# Patient Record
Sex: Female | Born: 1940 | Race: Black or African American | Hispanic: No | Smoking: Former smoker
Health system: Southern US, Community
[De-identification: ages and names within clinical notes are randomized; demographics above are authoritative.]

## PROBLEM LIST (undated history)

## (undated) DIAGNOSIS — F319 Bipolar disorder, unspecified: Secondary | ICD-10-CM

## (undated) DIAGNOSIS — R112 Nausea with vomiting, unspecified: Secondary | ICD-10-CM

## (undated) DIAGNOSIS — F32A Depression, unspecified: Secondary | ICD-10-CM

## (undated) DIAGNOSIS — M6281 Muscle weakness (generalized): Secondary | ICD-10-CM

## (undated) DIAGNOSIS — G629 Polyneuropathy, unspecified: Secondary | ICD-10-CM

## (undated) DIAGNOSIS — F209 Schizophrenia, unspecified: Secondary | ICD-10-CM

## (undated) DIAGNOSIS — M069 Rheumatoid arthritis, unspecified: Secondary | ICD-10-CM

## (undated) DIAGNOSIS — G8220 Paraplegia, unspecified: Secondary | ICD-10-CM

## (undated) DIAGNOSIS — K219 Gastro-esophageal reflux disease without esophagitis: Secondary | ICD-10-CM

## (undated) DIAGNOSIS — Z933 Colostomy status: Secondary | ICD-10-CM

## (undated) DIAGNOSIS — M869 Osteomyelitis, unspecified: Secondary | ICD-10-CM

## (undated) DIAGNOSIS — Z8719 Personal history of other diseases of the digestive system: Secondary | ICD-10-CM

## (undated) DIAGNOSIS — D649 Anemia, unspecified: Secondary | ICD-10-CM

## (undated) DIAGNOSIS — J449 Chronic obstructive pulmonary disease, unspecified: Secondary | ICD-10-CM

## (undated) DIAGNOSIS — F329 Major depressive disorder, single episode, unspecified: Secondary | ICD-10-CM

## (undated) DIAGNOSIS — Z9889 Other specified postprocedural states: Secondary | ICD-10-CM

## (undated) HISTORY — PX: GANGLION CYST EXCISION: SHX1691

## (undated) HISTORY — PX: BREAST SURGERY: SHX581

## (undated) HISTORY — PX: HERNIA REPAIR: SHX51

## (undated) HISTORY — PX: EYE SURGERY: SHX253

## (undated) HISTORY — PX: KNEE SURGERY: SHX244

## (undated) HISTORY — PX: WOUND DEBRIDEMENT: SHX247

## (undated) HISTORY — PX: COLON SURGERY: SHX602

## (undated) HISTORY — PX: ABDOMINAL HYSTERECTOMY: SHX81

## (undated) HISTORY — PX: JOINT REPLACEMENT: SHX530

---

## 2005-03-04 ENCOUNTER — Emergency Department: Payer: Self-pay | Admitting: Internal Medicine

## 2005-07-25 ENCOUNTER — Inpatient Hospital Stay: Payer: Self-pay | Admitting: Endocrinology

## 2005-07-25 ENCOUNTER — Other Ambulatory Visit: Payer: Self-pay

## 2006-01-06 ENCOUNTER — Ambulatory Visit (HOSPITAL_COMMUNITY): Admission: RE | Admit: 2006-01-06 | Discharge: 2006-01-06 | Payer: Self-pay | Admitting: Internal Medicine

## 2006-01-10 ENCOUNTER — Ambulatory Visit (HOSPITAL_COMMUNITY): Admission: RE | Admit: 2006-01-10 | Discharge: 2006-01-10 | Payer: Self-pay | Admitting: Internal Medicine

## 2006-01-12 ENCOUNTER — Ambulatory Visit (HOSPITAL_COMMUNITY): Admission: RE | Admit: 2006-01-12 | Discharge: 2006-01-12 | Payer: Self-pay | Admitting: Internal Medicine

## 2008-05-03 ENCOUNTER — Emergency Department (HOSPITAL_COMMUNITY): Admission: EM | Admit: 2008-05-03 | Discharge: 2008-05-03 | Payer: Self-pay | Admitting: Emergency Medicine

## 2008-06-02 ENCOUNTER — Ambulatory Visit (HOSPITAL_COMMUNITY): Admission: RE | Admit: 2008-06-02 | Discharge: 2008-06-02 | Payer: Self-pay | Admitting: Internal Medicine

## 2008-06-16 ENCOUNTER — Inpatient Hospital Stay (HOSPITAL_COMMUNITY): Admission: EM | Admit: 2008-06-16 | Discharge: 2008-06-23 | Payer: Self-pay | Admitting: Emergency Medicine

## 2008-06-19 ENCOUNTER — Ambulatory Visit: Payer: Self-pay | Admitting: Infectious Diseases

## 2009-12-19 ENCOUNTER — Emergency Department (HOSPITAL_COMMUNITY): Admission: EM | Admit: 2009-12-19 | Discharge: 2009-12-19 | Payer: Self-pay | Admitting: Emergency Medicine

## 2010-04-30 ENCOUNTER — Emergency Department (HOSPITAL_COMMUNITY): Admission: EM | Admit: 2010-04-30 | Discharge: 2010-04-30 | Payer: Self-pay | Admitting: Emergency Medicine

## 2010-05-04 ENCOUNTER — Inpatient Hospital Stay (HOSPITAL_COMMUNITY)
Admission: EM | Admit: 2010-05-04 | Discharge: 2010-05-12 | Payer: Self-pay | Source: Home / Self Care | Admitting: Emergency Medicine

## 2010-05-11 ENCOUNTER — Encounter (INDEPENDENT_AMBULATORY_CARE_PROVIDER_SITE_OTHER): Payer: Self-pay | Admitting: Internal Medicine

## 2010-09-20 ENCOUNTER — Emergency Department (HOSPITAL_COMMUNITY): Admission: EM | Admit: 2010-09-20 | Discharge: 2010-09-20 | Payer: Self-pay | Admitting: Emergency Medicine

## 2010-09-28 ENCOUNTER — Emergency Department (HOSPITAL_COMMUNITY): Admission: EM | Admit: 2010-09-28 | Discharge: 2010-09-30 | Payer: Self-pay | Admitting: Emergency Medicine

## 2010-10-20 ENCOUNTER — Inpatient Hospital Stay (HOSPITAL_COMMUNITY)
Admission: EM | Admit: 2010-10-20 | Discharge: 2010-10-28 | Payer: Self-pay | Source: Home / Self Care | Admitting: Emergency Medicine

## 2010-10-20 DIAGNOSIS — F259 Schizoaffective disorder, unspecified: Secondary | ICD-10-CM

## 2010-12-19 ENCOUNTER — Encounter: Payer: Self-pay | Admitting: Endocrinology

## 2011-02-08 LAB — URINE MICROSCOPIC-ADD ON

## 2011-02-08 LAB — CBC
HCT: 30.9 % — ABNORMAL LOW (ref 36.0–46.0)
HCT: 31.8 % — ABNORMAL LOW (ref 36.0–46.0)
HCT: 38 % (ref 36.0–46.0)
Hemoglobin: 10.3 g/dL — ABNORMAL LOW (ref 12.0–15.0)
Hemoglobin: 10.7 g/dL — ABNORMAL LOW (ref 12.0–15.0)
Hemoglobin: 12.1 g/dL (ref 12.0–15.0)
MCH: 30 pg (ref 26.0–34.0)
MCH: 30.2 pg (ref 26.0–34.0)
MCH: 30.4 pg (ref 26.0–34.0)
MCH: 30.5 pg (ref 26.0–34.0)
MCHC: 33.2 g/dL (ref 30.0–36.0)
MCHC: 33.7 g/dL (ref 30.0–36.0)
MCV: 89.7 fL (ref 78.0–100.0)
MCV: 90.4 fL (ref 78.0–100.0)
MCV: 90.9 fL (ref 78.0–100.0)
Platelets: 373 10*3/uL (ref 150–400)
Platelets: 414 10*3/uL — ABNORMAL HIGH (ref 150–400)
Platelets: 417 10*3/uL — ABNORMAL HIGH (ref 150–400)
RBC: 3.42 MIL/uL — ABNORMAL LOW (ref 3.87–5.11)
RBC: 3.55 MIL/uL — ABNORMAL LOW (ref 3.87–5.11)
RBC: 3.55 MIL/uL — ABNORMAL LOW (ref 3.87–5.11)
RBC: 3.97 MIL/uL (ref 3.87–5.11)
RDW: 13.9 % (ref 11.5–15.5)
RDW: 13.9 % (ref 11.5–15.5)
RDW: 14.5 % (ref 11.5–15.5)
WBC: 11.9 10*3/uL — ABNORMAL HIGH (ref 4.0–10.5)
WBC: 6.7 10*3/uL (ref 4.0–10.5)
WBC: 6.9 10*3/uL (ref 4.0–10.5)
WBC: 7.1 10*3/uL (ref 4.0–10.5)
WBC: 8.1 10*3/uL (ref 4.0–10.5)

## 2011-02-08 LAB — GLUCOSE, CAPILLARY
Glucose-Capillary: 147 mg/dL — ABNORMAL HIGH (ref 70–99)
Glucose-Capillary: 153 mg/dL — ABNORMAL HIGH (ref 70–99)
Glucose-Capillary: 156 mg/dL — ABNORMAL HIGH (ref 70–99)
Glucose-Capillary: 160 mg/dL — ABNORMAL HIGH (ref 70–99)
Glucose-Capillary: 166 mg/dL — ABNORMAL HIGH (ref 70–99)
Glucose-Capillary: 169 mg/dL — ABNORMAL HIGH (ref 70–99)
Glucose-Capillary: 179 mg/dL — ABNORMAL HIGH (ref 70–99)
Glucose-Capillary: 219 mg/dL — ABNORMAL HIGH (ref 70–99)
Glucose-Capillary: 223 mg/dL — ABNORMAL HIGH (ref 70–99)
Glucose-Capillary: 224 mg/dL — ABNORMAL HIGH (ref 70–99)
Glucose-Capillary: 226 mg/dL — ABNORMAL HIGH (ref 70–99)
Glucose-Capillary: 230 mg/dL — ABNORMAL HIGH (ref 70–99)
Glucose-Capillary: 235 mg/dL — ABNORMAL HIGH (ref 70–99)
Glucose-Capillary: 239 mg/dL — ABNORMAL HIGH (ref 70–99)
Glucose-Capillary: 251 mg/dL — ABNORMAL HIGH (ref 70–99)
Glucose-Capillary: 252 mg/dL — ABNORMAL HIGH (ref 70–99)
Glucose-Capillary: 275 mg/dL — ABNORMAL HIGH (ref 70–99)
Glucose-Capillary: 279 mg/dL — ABNORMAL HIGH (ref 70–99)
Glucose-Capillary: 283 mg/dL — ABNORMAL HIGH (ref 70–99)
Glucose-Capillary: 320 mg/dL — ABNORMAL HIGH (ref 70–99)
Glucose-Capillary: 421 mg/dL — ABNORMAL HIGH (ref 70–99)
Glucose-Capillary: 94 mg/dL (ref 70–99)

## 2011-02-08 LAB — BASIC METABOLIC PANEL
BUN: 6 mg/dL (ref 6–23)
CO2: 27 mEq/L (ref 19–32)
CO2: 28 mEq/L (ref 19–32)
Calcium: 8.9 mg/dL (ref 8.4–10.5)
Calcium: 8.9 mg/dL (ref 8.4–10.5)
Calcium: 9 mg/dL (ref 8.4–10.5)
Chloride: 99 mEq/L (ref 96–112)
Creatinine, Ser: 0.62 mg/dL (ref 0.4–1.2)
Creatinine, Ser: 0.63 mg/dL (ref 0.4–1.2)
GFR calc Af Amer: >60 mL/min (ref >60)
GFR calc Af Amer: >60 mL/min (ref >60)
GFR calc Af Amer: >60 mL/min (ref >60)
GFR calc non Af Amer: >60 mL/min (ref >60)
GFR calc non Af Amer: >60 mL/min (ref >60)
GFR calc non Af Amer: >60 mL/min (ref >60)
Glucose, Bld: 335 mg/dL — ABNORMAL HIGH (ref 70–99)
Potassium: 3.7 mEq/L (ref 3.5–5.1)
Sodium: 136 mEq/L (ref 135–145)
Sodium: 140 mEq/L (ref 135–145)
Sodium: 143 mEq/L (ref 135–145)

## 2011-02-08 LAB — URINE CULTURE
Colony Count: 990
Colony Count: >=100000
Culture  Setup Time: 201111020127

## 2011-02-08 LAB — RAPID URINE DRUG SCREEN, HOSP PERFORMED
Benzodiazepines: NOT DETECTED
Cocaine: NOT DETECTED
Opiates: NOT DETECTED
Tetrahydrocannabinol: NOT DETECTED
Tetrahydrocannabinol: NOT DETECTED

## 2011-02-08 LAB — ETHANOL: Alcohol, Ethyl (B): <5 mg/dL (ref 0–10)

## 2011-02-08 LAB — HEPATIC FUNCTION PANEL
ALT: 14 U/L (ref 0–35)
AST: 18 U/L (ref 0–37)
Albumin: 2.9 g/dL — ABNORMAL LOW (ref 3.5–5.2)
Alkaline Phosphatase: 58 U/L (ref 39–117)
Bilirubin, Direct: 0.1 mg/dL (ref 0.0–0.3)
Indirect Bilirubin: 0.2 mg/dL — ABNORMAL LOW (ref 0.3–0.9)
Total Bilirubin: 0.3 mg/dL (ref 0.3–1.2)
Total Protein: 7.7 g/dL (ref 6.0–8.3)

## 2011-02-08 LAB — URINALYSIS, MICROSCOPIC ONLY
Glucose, UA: NEGATIVE mg/dL
Protein, ur: NEGATIVE mg/dL
pH: 7 (ref 5.0–8.0)

## 2011-02-08 LAB — DIFFERENTIAL
Basophils Absolute: 0 10*3/uL (ref 0.0–0.1)
Basophils Absolute: 0 10*3/uL (ref 0.0–0.1)
Basophils Relative: 0 % (ref 0–1)
Eosinophils Absolute: 0.1 10*3/uL (ref 0.0–0.7)
Eosinophils Relative: 1 % (ref 0–5)
Lymphocytes Relative: 16 % (ref 12–46)
Monocytes Relative: 4 % (ref 3–12)
Neutro Abs: 9.4 10*3/uL — ABNORMAL HIGH (ref 1.7–7.7)
Neutrophils Relative %: 78 % — ABNORMAL HIGH (ref 43–77)
Neutrophils Relative %: 79 % — ABNORMAL HIGH (ref 43–77)

## 2011-02-08 LAB — URINALYSIS, ROUTINE W REFLEX MICROSCOPIC
Bilirubin Urine: NEGATIVE
Ketones, ur: NEGATIVE mg/dL
Nitrite: NEGATIVE
Nitrite: POSITIVE — AB
Protein, ur: NEGATIVE mg/dL
Specific Gravity, Urine: 1.01 (ref 1.005–1.030)
Urobilinogen, UA: 0.2 mg/dL (ref 0.0–1.0)
Urobilinogen, UA: 0.2 mg/dL (ref 0.0–1.0)

## 2011-02-08 LAB — COMPREHENSIVE METABOLIC PANEL
Alkaline Phosphatase: 68 U/L (ref 39–117)
BUN: 3 mg/dL — ABNORMAL LOW (ref 6–23)
Creatinine, Ser: 0.53 mg/dL (ref 0.4–1.2)
Glucose, Bld: 113 mg/dL — ABNORMAL HIGH (ref 70–99)
Potassium: 3.9 mEq/L (ref 3.5–5.1)
Total Bilirubin: 0.5 mg/dL (ref 0.3–1.2)
Total Protein: 8 g/dL (ref 6.0–8.3)

## 2011-02-08 LAB — MRSA PCR SCREENING: MRSA by PCR: POSITIVE — AB

## 2011-02-09 LAB — POCT I-STAT, CHEM 8
BUN: 5 mg/dL — ABNORMAL LOW (ref 6–23)
Calcium, Ion: 1.06 mmol/L — ABNORMAL LOW (ref 1.12–1.32)
Chloride: 105 mEq/L (ref 96–112)
Creatinine, Ser: 0.5 mg/dL (ref 0.4–1.2)
Glucose, Bld: 242 mg/dL — ABNORMAL HIGH (ref 70–99)
HCT: 42 % (ref 36.0–46.0)
Hemoglobin: 14.3 g/dL (ref 12.0–15.0)
Potassium: 3.8 mEq/L (ref 3.5–5.1)
Sodium: 139 mEq/L (ref 135–145)
TCO2: 28 mmol/L (ref 0–100)

## 2011-02-13 LAB — BASIC METABOLIC PANEL
CO2: 25 mEq/L (ref 19–32)
Calcium: 7.8 mg/dL — ABNORMAL LOW (ref 8.4–10.5)
Creatinine, Ser: 0.39 mg/dL — ABNORMAL LOW (ref 0.4–1.2)
Glucose, Bld: 123 mg/dL — ABNORMAL HIGH (ref 70–99)

## 2011-02-13 LAB — CBC
Hemoglobin: 7.5 g/dL — ABNORMAL LOW (ref 12.0–15.0)
MCHC: 32.8 g/dL (ref 30.0–36.0)
MCV: 87.5 fL (ref 78.0–100.0)
RDW: 17.1 % — ABNORMAL HIGH (ref 11.5–15.5)

## 2011-02-13 LAB — GLUCOSE, CAPILLARY
Glucose-Capillary: 109 mg/dL — ABNORMAL HIGH (ref 70–99)
Glucose-Capillary: 111 mg/dL — ABNORMAL HIGH (ref 70–99)

## 2011-02-14 LAB — BASIC METABOLIC PANEL
BUN: 2 mg/dL — ABNORMAL LOW (ref 6–23)
BUN: 14 mg/dL (ref 6–23)
BUN: 5 mg/dL — ABNORMAL LOW (ref 6–23)
BUN: 6 mg/dL (ref 6–23)
BUN: 7 mg/dL (ref 6–23)
BUN: <1 mg/dL — ABNORMAL LOW (ref 6–23)
CO2: 26 mEq/L (ref 19–32)
CO2: 27 mEq/L (ref 19–32)
CO2: 30 mEq/L (ref 19–32)
Calcium: 7.8 mg/dL — ABNORMAL LOW (ref 8.4–10.5)
Calcium: 8.4 mg/dL (ref 8.4–10.5)
Chloride: 103 mEq/L (ref 96–112)
Chloride: 104 mEq/L (ref 96–112)
Chloride: 106 mEq/L (ref 96–112)
Chloride: 109 mEq/L (ref 96–112)
Chloride: 98 mEq/L (ref 96–112)
Creatinine, Ser: 0.4 mg/dL (ref 0.4–1.2)
Creatinine, Ser: 0.48 mg/dL (ref 0.4–1.2)
Creatinine, Ser: 0.5 mg/dL (ref 0.4–1.2)
Creatinine, Ser: 0.55 mg/dL (ref 0.4–1.2)
GFR calc Af Amer: >60 mL/min (ref >60)
GFR calc Af Amer: >60 mL/min (ref >60)
GFR calc non Af Amer: >60 mL/min (ref >60)
GFR calc non Af Amer: >60 mL/min (ref >60)
GFR calc non Af Amer: >60 mL/min (ref >60)
Glucose, Bld: 207 mg/dL — ABNORMAL HIGH (ref 70–99)
Glucose, Bld: 220 mg/dL — ABNORMAL HIGH (ref 70–99)
Glucose, Bld: 363 mg/dL — ABNORMAL HIGH (ref 70–99)
Potassium: 3.9 mEq/L (ref 3.5–5.1)
Potassium: 3.9 mEq/L (ref 3.5–5.1)
Potassium: 4.4 mEq/L (ref 3.5–5.1)
Sodium: 141 mEq/L (ref 135–145)
Sodium: 131 mEq/L — ABNORMAL LOW (ref 135–145)
Sodium: 138 mEq/L (ref 135–145)
Sodium: 141 mEq/L (ref 135–145)

## 2011-02-14 LAB — URINALYSIS, ROUTINE W REFLEX MICROSCOPIC
Bilirubin Urine: NEGATIVE
Bilirubin Urine: NEGATIVE
Glucose, UA: 500 mg/dL — AB
Ketones, ur: 15 mg/dL — AB
Ketones, ur: NEGATIVE mg/dL
Nitrite: NEGATIVE
Protein, ur: NEGATIVE mg/dL
pH: 7 (ref 5.0–8.0)
pH: 5.5 (ref 5.0–8.0)

## 2011-02-14 LAB — GLUCOSE, CAPILLARY
Glucose-Capillary: 128 mg/dL — ABNORMAL HIGH (ref 70–99)
Glucose-Capillary: 141 mg/dL — ABNORMAL HIGH (ref 70–99)
Glucose-Capillary: 144 mg/dL — ABNORMAL HIGH (ref 70–99)
Glucose-Capillary: 162 mg/dL — ABNORMAL HIGH (ref 70–99)
Glucose-Capillary: 183 mg/dL — ABNORMAL HIGH (ref 70–99)
Glucose-Capillary: 200 mg/dL — ABNORMAL HIGH (ref 70–99)
Glucose-Capillary: 236 mg/dL — ABNORMAL HIGH (ref 70–99)
Glucose-Capillary: 260 mg/dL — ABNORMAL HIGH (ref 70–99)
Glucose-Capillary: 287 mg/dL — ABNORMAL HIGH (ref 70–99)
Glucose-Capillary: 293 mg/dL — ABNORMAL HIGH (ref 70–99)
Glucose-Capillary: 312 mg/dL — ABNORMAL HIGH (ref 70–99)
Glucose-Capillary: 317 mg/dL — ABNORMAL HIGH (ref 70–99)
Glucose-Capillary: 324 mg/dL — ABNORMAL HIGH (ref 70–99)
Glucose-Capillary: 334 mg/dL — ABNORMAL HIGH (ref 70–99)
Glucose-Capillary: 369 mg/dL — ABNORMAL HIGH (ref 70–99)
Glucose-Capillary: 75 mg/dL (ref 70–99)

## 2011-02-14 LAB — CBC
HCT: 38.1 % (ref 36.0–46.0)
HCT: 23.9 % — ABNORMAL LOW (ref 36.0–46.0)
HCT: 25.7 % — ABNORMAL LOW (ref 36.0–46.0)
HCT: 28 % — ABNORMAL LOW (ref 36.0–46.0)
Hemoglobin: 13 g/dL (ref 12.0–15.0)
Hemoglobin: 8.5 g/dL — ABNORMAL LOW (ref 12.0–15.0)
Hemoglobin: 8.7 g/dL — ABNORMAL LOW (ref 12.0–15.0)
Hemoglobin: 8.9 g/dL — ABNORMAL LOW (ref 12.0–15.0)
Hemoglobin: 9.2 g/dL — ABNORMAL LOW (ref 12.0–15.0)
MCHC: 32.7 g/dL (ref 30.0–36.0)
MCHC: 32.8 g/dL (ref 30.0–36.0)
MCHC: 32.9 g/dL (ref 30.0–36.0)
MCHC: 33 g/dL (ref 30.0–36.0)
MCHC: 33.3 g/dL (ref 30.0–36.0)
MCV: 91.6 fL (ref 78.0–100.0)
MCV: 86.2 fL (ref 78.0–100.0)
MCV: 86.7 fL (ref 78.0–100.0)
MCV: 86.9 fL (ref 78.0–100.0)
MCV: 87.1 fL (ref 78.0–100.0)
MCV: 88.1 fL (ref 78.0–100.0)
MCV: 88.9 fL (ref 78.0–100.0)
Platelets: 222 10*3/uL (ref 150–400)
Platelets: 350 10*3/uL (ref 150–400)
Platelets: 367 10*3/uL (ref 150–400)
Platelets: 388 10*3/uL (ref 150–400)
Platelets: 405 10*3/uL — ABNORMAL HIGH (ref 150–400)
Platelets: 448 10*3/uL — ABNORMAL HIGH (ref 150–400)
RBC: 4.16 MIL/uL (ref 3.87–5.11)
RBC: 2.69 MIL/uL — ABNORMAL LOW (ref 3.87–5.11)
RBC: 3.03 MIL/uL — ABNORMAL LOW (ref 3.87–5.11)
RDW: 15.4 % (ref 11.5–15.5)
RDW: 15.6 % — ABNORMAL HIGH (ref 11.5–15.5)
RDW: 16.4 % — ABNORMAL HIGH (ref 11.5–15.5)
RDW: 17 % — ABNORMAL HIGH (ref 11.5–15.5)
RDW: 17.3 % — ABNORMAL HIGH (ref 11.5–15.5)
WBC: 4.2 10*3/uL (ref 4.0–10.5)
WBC: 6.1 10*3/uL (ref 4.0–10.5)
WBC: 6.4 10*3/uL (ref 4.0–10.5)
WBC: 6.4 10*3/uL (ref 4.0–10.5)
WBC: 6.9 10*3/uL (ref 4.0–10.5)

## 2011-02-14 LAB — CLOSTRIDIUM DIFFICILE EIA: C difficile Toxins A+B, EIA: NEGATIVE

## 2011-02-14 LAB — LIPID PANEL
Cholesterol: 130 mg/dL (ref 0–200)
HDL: 54 mg/dL (ref >39)
Total CHOL/HDL Ratio: 2.4 RATIO
Triglycerides: 119 mg/dL (ref <150)

## 2011-02-14 LAB — COMPREHENSIVE METABOLIC PANEL
ALT: 12 U/L (ref 0–35)
Calcium: 8.7 mg/dL (ref 8.4–10.5)
Glucose, Bld: 311 mg/dL — ABNORMAL HIGH (ref 70–99)
Sodium: 139 mEq/L (ref 135–145)
Total Protein: 7.4 g/dL (ref 6.0–8.3)

## 2011-02-14 LAB — CK TOTAL AND CKMB (NOT AT ARMC)
CK, MB: 0.9 ng/mL (ref 0.3–4.0)
Relative Index: 0.7 (ref 0.0–2.5)
Total CK: 128 U/L (ref 7–177)

## 2011-02-14 LAB — TROPONIN I
Troponin I: 0.01 ng/mL (ref 0.00–0.06)
Troponin I: <0.01 ng/mL (ref 0.00–0.06)

## 2011-02-14 LAB — URINE MICROSCOPIC-ADD ON

## 2011-02-14 LAB — VITAMIN B12: Vitamin B-12: 433 pg/mL (ref 211–911)

## 2011-02-14 LAB — POCT I-STAT 4, (NA,K, GLUC, HGB,HCT)
Glucose, Bld: 187 mg/dL — ABNORMAL HIGH (ref 70–99)
Hemoglobin: 10.2 g/dL — ABNORMAL LOW (ref 12.0–15.0)
Hemoglobin: 7.8 g/dL — ABNORMAL LOW (ref 12.0–15.0)
Potassium: 3.4 mEq/L — ABNORMAL LOW (ref 3.5–5.1)
Sodium: 145 mEq/L (ref 135–145)

## 2011-02-14 LAB — DIFFERENTIAL
Eosinophils Absolute: 0.2 10*3/uL (ref 0.0–0.7)
Eosinophils Absolute: 0.3 10*3/uL (ref 0.0–0.7)
Eosinophils Relative: 5 % (ref 0–5)
Eosinophils Relative: 4 % (ref 0–5)
Lymphocytes Relative: 34 % (ref 12–46)
Lymphocytes Relative: 23 % (ref 12–46)
Lymphs Abs: 1.5 10*3/uL (ref 0.7–4.0)
Lymphs Abs: 1.4 10*3/uL (ref 0.7–4.0)
Monocytes Absolute: 0.4 10*3/uL (ref 0.1–1.0)
Monocytes Absolute: 0.7 10*3/uL (ref 0.1–1.0)
Monocytes Relative: 9 % (ref 3–12)
Monocytes Relative: 11 % (ref 3–12)

## 2011-02-14 LAB — HEMOCCULT GUIAC POC 1CARD (OFFICE): Fecal Occult Bld: POSITIVE

## 2011-02-14 LAB — CULTURE, BLOOD (ROUTINE X 2)

## 2011-02-14 LAB — TYPE AND SCREEN: ABO/RH(D): O POS

## 2011-02-14 LAB — TSH: TSH: 2.905 u[IU]/mL (ref 0.350–4.500)

## 2011-02-14 LAB — HEMOGLOBIN A1C: Mean Plasma Glucose: 223 mg/dL — ABNORMAL HIGH (ref <117)

## 2011-02-14 LAB — RETICULOCYTES
RBC.: 3.4 MIL/uL — ABNORMAL LOW (ref 3.87–5.11)
Retic Count, Absolute: 71.4 10*3/uL (ref 19.0–186.0)

## 2011-02-14 LAB — URINE CULTURE: Colony Count: >=100000

## 2011-02-14 LAB — PREPARE RBC (CROSSMATCH)

## 2011-02-14 LAB — IRON AND TIBC: Iron: 36 ug/dL — ABNORMAL LOW (ref 42–135)

## 2011-04-12 NOTE — H&P (Signed)
NAMEMarland Kitchen  ARADIA, ESTEY NO.:  [id number]   MEDICAL RECORD NO.:  [id number]          PATIENT TYPE:  INP   LOCATION:  0104                         FACILITY:  [facility]   PHYSICIAN:  Vania Rea, M.D. DATE OF BIRTH:  09/18/1941   DATE OF ADMISSION:  06/16/2008  DATE OF DISCHARGE:                              HISTORY & PHYSICAL   PRIMARY CARE PHYSICIAN:  [facility], Dr. Leanord Hawking and also Dr.  Kerry Dory.   CHIEF COMPLAINT:  Fever and shaking.   HISTORY OF PRESENT ILLNESS:  This is a 70 year old resident of a skilled  nursing facility, who has a history of schizo-affective disorder, remote  history of necrotizing fasciitis of the right hip, leading to a chronic  right buttock stage IV ulcer extended down to the bone, who is also  diabetic and who had a CT scan of the pelvis on July 6, which showed an  ulcer extending to the right greater trochanter and left ischial  tuberosity with chronic osteomyelitis of the right greater trochanter  and left shin.  The patient also had wound cultures which shows recently  came back on June 14, 2008, 2 days ago showing MRSA.  The patient was in  the nursing home, being treated with Rocephin and Augmentin, but today  had what was described as seizures and fever, and was brought in to the  emergency room for evaluation.  The patient also carries a diagnosis of  dementia and is unable to contribute to her history.  So the history is  taken from nursing home records, although there was a discussion with  her daughter, Brent Bulla, who has not seen her for some time.  There  is no history of cough or shortness of breath.  The patient is  essentially bed-bound.  The patient also has a chronic indwelling Foley.   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  Diabetes type 2,  COPD, osteoarthritis, schizo-affective disorder, history of endometrial  cancer, incisional hernia with chronic diverting colostomy dementia,  chronic anemia, maintained on Procrit.   MEDICATIONS:  Including:  1. Lantus 15 units daily.  2. Glipizide 10 mg daily.  3. Glucophage 1 tablet twice daily.  4. Depakote 750 mg twice daily.  5. Aricept 10 mg at bedtime.  6. Quetiapine 200 mg morning and afternoon and 300 mg at bedtime.  7. Zoloft 150 mg daily.  8. Baclofen 20 mg twice daily.  9. Iron sulfate 325 mg daily.  10.Centrum tablets 1 daily.  11.K-Dur 20 mEq daily.  12.Percocet 5/325, 1 tablet 3 times daily.  13.Vicodin 5/500, 2 tablets every 4 hours p.r.n.  14.Tylenol 650 mg every 4 hours p.r.n.  15.Sliding scale insulin.  16.Klonopin 0.5 mg twice daily.   ALLERGIES:  HALDOL DECANOATE, ACE INHIBITORS AND FOR THORAZINE.   SOCIAL HISTORY:  The patient now lives, it appears, at [facility].  Previously was at [facility].  Used to be wheelchair dependent  and able to transfer herself with assistance.  Does not appear to be  doing that now.  Admits to smoking one pack per day of cigarettes.  Denies alcohol or illicit drug.   FAMILY HISTORY:  Significant for diabetes mellitus.   REVIEW OF SYSTEMS:  Because of the patient's mental status, unable to  ascertain.  However, review of the nursing home chart indicates the  patient is generally alert, confused at times.  Has contractures,  limited assist.  Fair appetite, is able to feed herself.  Has a chronic  indwelling Foley.  Is incontinent of urine.  Her hearing is good and she  has dentures.   PHYSICAL EXAMINATION:  Pleasant, elderly African American lady lying in  the stretcher.  She is oriented only to herself, but not time or place.  She is alert, does not answer questions appropriately, but cooperates  with the physical exam.  VITAL SIGNS:  Initially her temperature is 102.8 orally with a pulse of  141.  Respirations 20, and blood pressure 115/53 with IV fluids this  pulse has come down to  125, gut her blood pressure has fallen to 82/49.  Her pupils are round and equal.  Mucous membranes are pale.  She has no  cervical or thyroid lymphadenopathy.  No jugular venous distention.  CHEST:  Clear to auscultation bilaterally.  CARDIOVASCULAR:  She is tachycardic.  ABDOMEN:  She has a ventral hernia.  She has a clean-looking colostomy.  The bag is empty.  She does not appear to have any abdominal tenderness.  EXTREMITIES:  She is contracted.  There is no edema.  She has a stage II  wound of the left buttock and a stage IV wound of the right buttock,  which is packed.  CENTRAL NERVOUS SYSTEM:  Cranial nerves 2-12 appear to be grossly intact  and she has good color in both upper extremities, grade 4-5 within the  limits that she is able to cooperate with the exam.  She does have some  wasting of the small muscles of the hand, unable to fully assess  neurologic status of her lower extremities.   LABORATORY DATA:  As noted, cultures done few days ago show MRSA in the  right buttock wound.  Her white count today is 5.0, hemoglobin 7.7,  hematocrit 24.4, MCV 87, platelets 426 with 93% neutrophils.  Her sodium  is 140, potassium 3.3, chloride 103, BUN 6, creatinine 0.6, glucose 151,  ionized calcium is 1.04.  Chest x-ray shows no evidence of pneumonia.  Urinalysis shows a specific gravity of 1.029 and trace leukocyte  esterase and 15 mg/dL of ketones occult blood is negative.   ASSESSMENT:  1. Sepsis.  2. Osteomyelitis of the right hip, probably due to methicillin-      resistant Staphylococcus aureus.  3. Chronic anemia.  4. Hypokalemia.  5. Dehydration.  6. Hypocalcemia.  7. Diabetes type 2, fair control.  8. Multiple medical problems.   PLAN:  Will admit this lady for IV fluid hydration and blood cultures  and urine cultures have been done well.  Will start her on vancomycin on  Ertapenem.  Will replace potassium and calcium.  Will continue most of  her chronic medications,  but will treat her diabetes with insulin and  remove the Glucophage and glipizide.  Will transfuse 2 units of packed  red cells, since this will assist healing.  The patient may require  consults to assist with osteomyelitis and may require a PICC line at a  later date.  Other plans as per orders.   ADDENDUM:  The patient's code status has been discussed with her  daughter, Brent Bulla, and she is unable to make a decision at this  time.  The patient will remain full cold until siblings have discussed  code status.      Vania Rea, M.D.  Electronically Signed     LC/MEDQ  D:  06/16/2008  T:  06/16/2008  Job:  [id number]   cc:   Maxwell Caul, M.D.

## 2011-04-12 NOTE — Discharge Summary (Signed)
Shannon Schmidt, Shannon Schmidt              ACCOUNT NO.:  [id number]   MEDICAL RECORD NO.:  [id number]          PATIENT TYPE:  INP   LOCATION:  1307                         FACILITY:  [facility]   PHYSICIAN:  Hind I Eda Paschal, MD      DATE OF BIRTH:  08/14/1941   DATE OF ADMISSION:  06/16/2008  DATE OF DISCHARGE:  06/23/2008                               DISCHARGE SUMMARY   PRIMARY CARE PHYSICIAN:  [facility], Maxwell Caul, M.D.   DISCHARGE DIAGNOSES:  1. Chronic osteomyelitis of the right greater trochanter and left      ischial tuberosity.  2. Methicillin-resistant Staphylococcus aureus of the decubitus ulcer.  3. Large bilateral decubitus ulcers.  4. Proteus mirabilis bacteremia sensitive to Unasyn and Zosyn.  5. Diabetes mellitus type 2.  6. Schizoaffective disorder.  7. Chronic anemia, anemia of chronic disease.  8. History of dementia.  9. History of osteoarthritis.  10.History of endometrial cancer.  11.Chronic diverting colostomy.   DISCHARGE MEDICATIONS:  1. Insulin Lantus 15 units subcu daily.  2. Glipizide 5 mg daily.  3. Glucophage 500 mg twice daily.  4. Levaquin 750 mg twice daily.  5. Klonopin 0.5 mg twice daily.  6. Percocet 5/325 mg three times daily as needed.  7. Potassium chloride 20 mEq daily.  8. Ferrous sulfate 325 mg daily.  9. Aricept 10 mg at bedtime.  10.Seroquel 200 mg twice daily.  11.Centrum one tablet daily.  12.Zoloft 150 mg daily.  13.Ascorbic acid 500 mg twice daily.  14.Procrit 20,000 units monthly.  15.Insulin sliding scale sensitive t.i.d. a.c.  16.Baclofen 20 mg twice daily.  17.Vancomycin 1 gram IV q.12 hours for 5 weeks.  18.invanz i gm daily iv for 5 weeks   PROCEDURE:  1. PICC line insertion.  2. Chest x-ray; no definitive pneumonia.  3. CT of pelvis and extremity; negative for evidence of abscess.  No      markings or change in the appearance of the pelvis.  Large      bilateral decubitus ulcer with chronic osteomyelitis of  the right      greater trochanter and left ischial tuberosity.   CONSULTATIONS:  Infectious disease consulted and done by Tinnie Gens C.  Ninetta Lights, M.D.   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   HOSPITAL COURSE:  Problem 1.  The patient admitted to the hospital for  chronic osteomyelitis of her lower extremities.  The patient is started  on broad spectrum antibiotics, mainly Zosyn and Vancomycin.  Wound care  was consulted and they recommended air mattress for pressure reduction  and AcuSeal to the right hip and left ischium to absorb drainage and  provide antimicrobial benefit.  Keep heels elevated for greater relief.  During hospitalization, the patient had no further fever or white blood  cells.  Her CT scan of the lower extremities and pelvis which did not  show evidence of an abscess other  than chronic osteomyelitis.  Her wound  culture done at the nursing home which grew MRSA and accordingly the  patient was continued on Vancomycin.  Wound culture here was negative.  Infectious disease was consulted where they recommended to continue  antibiotics for 6 weeks.   Problem 2.  Proteus mirabilis in blood.  Two cultures grew Proteus  mirabilis which is sensitive to Unasyn and Zosyn.  The infectious  disease recommendation was to continue treatment for 6 weeks.  Today is  past 1 week.  CT scan showed no abscess.  The patient has no more fever  or white blood cells.   Problem 3.  Diabetes mellitus which seemed reasonably controlled during  hospitalization.  She had some episodes of hypoglycemia and for that we  decreased the dose of Glipizide and Metformin and we asked the nursing  home for further adjustment of her diabetic regime at nursing home.   Problem 4.  Anemia of chronic  disease, status post blood transfusion,  the patient's hemoglobin was 7.7 on admission and H&H remained stable at  9.7.  Anemia panel did show evidence of anemia of chronic disease.  Occult fecal blood was negative.  We recommend to continue Procrit with  ferrous sulfate as an outpatient.   Problem 5.  Hypokalemia, status post replacement.   Problem 6.  Schizoaffective disorder.  The patient will continue on her  dose of Klonopin and Seroquel and remained stable.  We felt that the  patient is medically stable to be discharged to a nursing home.  Nursing  home is to check Vancomycin peak and trough and adjust the dose as per  pharmacy.  Also adjustment of her diabetic regime to be addressed at the  nursing home.      Hind Bosie Helper, MD  Electronically Signed     HIE/MEDQ  D:  06/23/2008  T:  06/23/2008  Job:  [id number]

## 2011-08-25 LAB — URINE MICROSCOPIC-ADD ON

## 2011-08-25 LAB — CBC
HCT: 28.8 — ABNORMAL LOW
Hemoglobin: 9.3 — ABNORMAL LOW
MCHC: 32.3
MCV: 88.3
RBC: 3.26 — ABNORMAL LOW
RDW: 16.4 — ABNORMAL HIGH

## 2011-08-25 LAB — URINALYSIS, ROUTINE W REFLEX MICROSCOPIC
Bilirubin Urine: NEGATIVE
Hgb urine dipstick: NEGATIVE
Ketones, ur: 15 — AB
Specific Gravity, Urine: 1.036 — ABNORMAL HIGH
Urobilinogen, UA: 1

## 2011-08-25 LAB — URINE CULTURE: Culture: NO GROWTH

## 2011-08-25 LAB — COMPREHENSIVE METABOLIC PANEL
ALT: 16
AST: 18
Albumin: 1.7 — ABNORMAL LOW
Alkaline Phosphatase: 41
Chloride: 103
GFR calc Af Amer: >60
Potassium: 4.2
Sodium: 139
Total Protein: 7.8

## 2011-08-25 LAB — DIFFERENTIAL
Basophils Relative: 0
Eosinophils Absolute: 0.1
Eosinophils Relative: 1
Monocytes Absolute: 0.9
Monocytes Relative: 8
Neutro Abs: 7.7

## 2011-08-26 LAB — URINALYSIS, ROUTINE W REFLEX MICROSCOPIC
Bilirubin Urine: NEGATIVE
Glucose, UA: NEGATIVE
Hgb urine dipstick: NEGATIVE
Ketones, ur: 15 — AB
Nitrite: NEGATIVE
Protein, ur: 30 — AB
Specific Gravity, Urine: 1.029
Urobilinogen, UA: 0.2
pH: 6

## 2011-08-26 LAB — GLUCOSE, CAPILLARY
Glucose-Capillary: 118 — ABNORMAL HIGH
Glucose-Capillary: 127 — ABNORMAL HIGH
Glucose-Capillary: 140 — ABNORMAL HIGH
Glucose-Capillary: 141 — ABNORMAL HIGH
Glucose-Capillary: 141 — ABNORMAL HIGH
Glucose-Capillary: 156 — ABNORMAL HIGH
Glucose-Capillary: 196 — ABNORMAL HIGH
Glucose-Capillary: 226 — ABNORMAL HIGH
Glucose-Capillary: 72
Glucose-Capillary: 88
Glucose-Capillary: 88

## 2011-08-26 LAB — CROSSMATCH
ABO/RH(D): O POS
Antibody Screen: NEGATIVE

## 2011-08-26 LAB — RETICULOCYTES: Retic Ct Pct: 1.5

## 2011-08-26 LAB — CBC
HCT: 24.4 — ABNORMAL LOW
HCT: 30.4 — ABNORMAL LOW
Hemoglobin: 11.2 — ABNORMAL LOW
Hemoglobin: 7.7 — CL
MCHC: 31.6
MCHC: 31.8
MCV: 87
MCV: 88.6
Platelets: 426 — ABNORMAL HIGH
Platelets: 506 — ABNORMAL HIGH
RBC: 2.8 — ABNORMAL LOW
RBC: 3.43 — ABNORMAL LOW
RBC: 3.97
RDW: 18.8 — ABNORMAL HIGH
RDW: 21.1 — ABNORMAL HIGH
WBC: 5
WBC: 6.5
WBC: 7.6

## 2011-08-26 LAB — IRON AND TIBC
Iron: <10 — ABNORMAL LOW
UIBC: 63

## 2011-08-26 LAB — CULTURE, BLOOD (ROUTINE X 2): Culture: NO GROWTH

## 2011-08-26 LAB — POCT I-STAT, CHEM 8
BUN: 6
Calcium, Ion: 1.04 — ABNORMAL LOW
Chloride: 103
Creatinine, Ser: 0.6
Glucose, Bld: 151 — ABNORMAL HIGH
HCT: 27 — ABNORMAL LOW
Hemoglobin: 9.2 — ABNORMAL LOW
Potassium: 3.3 — ABNORMAL LOW
Sodium: 140
TCO2: 27

## 2011-08-26 LAB — BASIC METABOLIC PANEL
BUN: 1 — ABNORMAL LOW
CO2: 31
Calcium: 7.9 — ABNORMAL LOW
Calcium: 8.1 — ABNORMAL LOW
Chloride: 104
Creatinine, Ser: 0.35 — ABNORMAL LOW
Creatinine, Ser: 0.44
GFR calc Af Amer: >60
GFR calc Af Amer: >60
GFR calc non Af Amer: >60
GFR calc non Af Amer: >60
GFR calc non Af Amer: >60
Glucose, Bld: 92
Potassium: 3.3 — ABNORMAL LOW
Sodium: 141
Sodium: 141

## 2011-08-26 LAB — VANCOMYCIN, TROUGH: Vancomycin Tr: 15.8

## 2011-08-26 LAB — DIFFERENTIAL
Basophils Absolute: 0
Basophils Relative: 0
Eosinophils Absolute: 0
Eosinophils Relative: 0
Lymphocytes Relative: 3 — ABNORMAL LOW
Lymphs Abs: 0.2 — ABNORMAL LOW
Monocytes Absolute: 0.2
Monocytes Relative: 4
Neutro Abs: 4.6
Neutrophils Relative %: 93 — ABNORMAL HIGH

## 2011-08-26 LAB — ABO/RH: ABO/RH(D): O POS

## 2011-08-26 LAB — URINE CULTURE
Colony Count: NO GROWTH
Culture: NO GROWTH

## 2011-08-26 LAB — WOUND CULTURE: Gram Stain: NONE SEEN

## 2011-08-26 LAB — URINE MICROSCOPIC-ADD ON

## 2011-08-26 LAB — FERRITIN: Ferritin: 1993 — ABNORMAL HIGH (ref 10–291)

## 2011-08-26 LAB — VITAMIN B12: Vitamin B-12: 599 (ref 211–911)

## 2011-08-26 LAB — VALPROIC ACID LEVEL: Valproic Acid Lvl: 16.3 — ABNORMAL LOW

## 2011-08-26 LAB — FOLATE: Folate: >20

## 2012-01-30 ENCOUNTER — Encounter (HOSPITAL_BASED_OUTPATIENT_CLINIC_OR_DEPARTMENT_OTHER): Payer: Medicare Other | Attending: Internal Medicine

## 2012-01-30 DIAGNOSIS — Z96659 Presence of unspecified artificial knee joint: Secondary | ICD-10-CM | POA: Insufficient documentation

## 2012-01-30 DIAGNOSIS — L89109 Pressure ulcer of unspecified part of back, unspecified stage: Secondary | ICD-10-CM | POA: Insufficient documentation

## 2012-01-30 DIAGNOSIS — Z79899 Other long term (current) drug therapy: Secondary | ICD-10-CM | POA: Insufficient documentation

## 2012-01-30 DIAGNOSIS — L89309 Pressure ulcer of unspecified buttock, unspecified stage: Secondary | ICD-10-CM | POA: Insufficient documentation

## 2012-01-30 DIAGNOSIS — J449 Chronic obstructive pulmonary disease, unspecified: Secondary | ICD-10-CM | POA: Insufficient documentation

## 2012-01-30 DIAGNOSIS — Z794 Long term (current) use of insulin: Secondary | ICD-10-CM | POA: Insufficient documentation

## 2012-01-30 DIAGNOSIS — Z8542 Personal history of malignant neoplasm of other parts of uterus: Secondary | ICD-10-CM | POA: Insufficient documentation

## 2012-01-30 DIAGNOSIS — L8990 Pressure ulcer of unspecified site, unspecified stage: Secondary | ICD-10-CM | POA: Insufficient documentation

## 2012-01-30 DIAGNOSIS — E119 Type 2 diabetes mellitus without complications: Secondary | ICD-10-CM | POA: Insufficient documentation

## 2012-01-30 DIAGNOSIS — J4489 Other specified chronic obstructive pulmonary disease: Secondary | ICD-10-CM | POA: Insufficient documentation

## 2012-01-30 DIAGNOSIS — Z933 Colostomy status: Secondary | ICD-10-CM | POA: Insufficient documentation

## 2012-01-31 NOTE — Progress Notes (Signed)
[facility]  NAME:  Shannon Schmidt, Shannon Schmidt              ACCOUNT NO.:  [id number]  MEDICAL RECORD NO.:  [id number]      DATE OF BIRTH:  Jun 28, 1941  PHYSICIAN:  Jonelle Sports. Halana Deisher, M.D.  VISIT DATE:  01/30/2012                                  OFFICE VISIT   HISTORY:  This 71 year old black female chronically bedridden, is referred from [facility] for evaluation and advice regarding several open wounds in the buttock area with reported osteomyelitis of the right hip area and buttock.  The patient is a poor historian and we have limited information as to how all these things began.  Exactly what rendered the patient bedridden is unclear to me, but she suggested that all began with her knee surgery a number of years ago, maybe 10-12, after which she never got fully ambulatory again and eventually became unable to walk and became bedridden.  It was apparently at that time that she developed various decubiti on her ischial areas and also more in the buttock parasacral areas.  She underwent a diverting colostomy for that purpose and for a while had a Foley catheter in, but reports that has never stayed in satisfactory and so has been removed.  With her persistent open wounds and the reported osteomyelitis on the right, although we have no prove to that on the records available to me.  She is referred for our evaluation and advice.  PAST SURGERY HISTORY:  Operations include complete hysterectomy a number of years ago, drainage of a breast abscess sometime in the distant past, knee replacement on the left as indicated in the present illness. Several surgeries to her buttock lesions in around 2006, remote removal of two cysts from her right wrist and diverting colostomy in the recent past.  She has had other hospitalizations in association with schizoaffective disorder and bipolar state with endometrial cancer which led to her hysterectomy.  She also has known  COPD, type 2 diabetes.  She is said to be allergic to HALDOL, THORAZINE and ACE INHIBITORS.  REGULAR MEDICATIONS: 1. Levemir insulin 100 units daily subcu. 2. NovoLog short-acting insulin by sliding scale with her meals. 3. Loratadine 10 mg daily. 4. Lumigan drops daily. 5. Mapap 325 mg b.i.d. 6. Robaxin 500 mg daily. 7. Multiple vitamin 1 daily. 8. Onglyza 5 mg daily. 9. Risperidone 2 mg daily. 10.Risperdal 3 mg daily. 11.Santyl applied to one or more of her wounds daily. 12.Simvastatin 10 mg daily. 13.Vitamin C 500 mg daily. 14.Baclofen 25 mg every 4 hours.  Family history, social and personal history is difficult to obtain from point of view review of systems.  She has no complaints at the moment except as related to the skin lesions on her buttocks.  PHYSICAL EXAMINATION:  Blood pressure is 146/71, pulse 99, respirations 16, temperature 98.2.  Random blood glucose 140 mg/dL.  This is a large, slightly obese, elderly black female effectively paraplegic, alert, cooperative reasonably well oriented and in no distress.  Her skin is warm and dry.  No reasonable texture.  She appears to be adequately nourished.  Her mucous membranes are moist.  Her abdomen shows a left lower quadrant colostomy which is functioning well.  No other apparent organomegaly or masses.  Her extremities show muscular atrophy as would be  expected.  Minimal edema at the ankles and palpable distal pulses.  She has numerous lesions about the buttocks, there are two healed apparent former decubiti in both ischial areas.  In addition, there are now lesions that are active on both buttocks down near the perianal and parasacral areas, the one on the right measuring 5.0 x 2.0 x 2.0 cm and measuring up to 4 cm in depth with no definite probing to bone.  It appears to be reasonably clean.  On the left, there is a bipartite ulceration measuring 6 x 7 cm and with one portion of the wound dissecting superiorly and  measuring 4 cm in depth, the other going inferomedially and 4 cm in depth, neither of which seem to probe the bone.  In the inferomedial limb of this ulcer is a polypoid collection of semi-necrotic tissue.  This wound is extremely malodorous, bleeds easily, but there is no definite fluid and certainly nothing that would appear to be fecal material present in the wound itself.  IMPRESSION:  Bilateral decubiti in the perianal and parasacral regions seemingly relatively uncomplicated on the right likely significantly infected on the left.  DISPOSITION:  The left wound is cultured.  Small amount of semi-necrotic material located in the inferomedial limb of the left wound is sharply dissected away with some bleeding with no real difficulty or problems.  The wounds are irrigated and are then both packed with Dakin's soaked gauze.  Recommendation is given to the receiving facility that they change these dressings on a daily basis replacing with Dakin's saturated solutions and if they continue this for a period of 2-3 weeks, at which time, we will re-evaluate her here.  Meanwhile, I will follow up on the culture results and advise the facility accordingly and either I or the managing physician there can make a decision regarding any antibiotic therapy.  Hopefully, the aforementioned measures will clean these wounds up somewhat and we can then make a better judgment about what the best long- term approach is.  I will also try to get some information regarding the evidence to suggest active osteomyelitis at this time.  Although, the patient is relatively young certainly given the complex total medical situation.  I would think that probably the continuation of relatively conservative approach remains appropriate.          ______________________________ Jonelle Sports. Cheryll Cockayne, M.D.     RES/MEDQ  D:  01/30/2012  T:  01/31/2012  Job:  [id number]

## 2012-02-06 ENCOUNTER — Emergency Department (HOSPITAL_COMMUNITY)
Admission: EM | Admit: 2012-02-06 | Discharge: 2012-02-07 | Disposition: A | Discharge status: Home / Self Care | Payer: Medicare Other | Attending: Emergency Medicine | Admitting: Emergency Medicine

## 2012-02-06 ENCOUNTER — Emergency Department (HOSPITAL_COMMUNITY): Payer: Medicare Other

## 2012-02-06 DIAGNOSIS — G8929 Other chronic pain: Secondary | ICD-10-CM | POA: Insufficient documentation

## 2012-02-06 DIAGNOSIS — Z9889 Other specified postprocedural states: Secondary | ICD-10-CM | POA: Insufficient documentation

## 2012-02-06 DIAGNOSIS — M7989 Other specified soft tissue disorders: Secondary | ICD-10-CM | POA: Insufficient documentation

## 2012-02-06 DIAGNOSIS — M216X9 Other acquired deformities of unspecified foot: Secondary | ICD-10-CM | POA: Insufficient documentation

## 2012-02-06 DIAGNOSIS — M25569 Pain in unspecified knee: Secondary | ICD-10-CM | POA: Insufficient documentation

## 2012-02-06 DIAGNOSIS — Z79899 Other long term (current) drug therapy: Secondary | ICD-10-CM | POA: Insufficient documentation

## 2012-02-06 HISTORY — DX: Bipolar disorder, unspecified: F31.9

## 2012-02-06 HISTORY — DX: Schizophrenia, unspecified: F20.9

## 2012-02-06 HISTORY — DX: Osteomyelitis, unspecified: M86.9

## 2012-02-06 HISTORY — DX: Muscle weakness (generalized): M62.81

## 2012-02-06 MED ORDER — HYDROMORPHONE HCL PF 1 MG/ML IJ SOLN
1.0000 mg | Freq: Once | INTRAMUSCULAR | Status: AC
  Administered 2012-02-06: 1 mg via INTRAMUSCULAR
  Filled 2012-02-06: qty 1

## 2012-02-06 MED ORDER — DIPHENHYDRAMINE HCL 50 MG/ML IJ SOLN
25.0000 mg | Freq: Once | INTRAMUSCULAR | Status: AC
  Administered 2012-02-06: 25 mg via INTRAMUSCULAR
  Filled 2012-02-06: qty 1

## 2012-02-06 NOTE — ED Notes (Signed)
Brought in by EMS fron nursing home facility with c/o BLE pain. Per EMS, pt has chronic arthritis pain but BLE pain has been getting worse since this morning--- was given Oxycodone at the NH facility, pt states "it's not working".  Pt denies trauma.

## 2012-02-06 NOTE — ED Provider Notes (Signed)
History     CSN: [id number]  Arrival date & time 02/06/12  2018   First MD Initiated Contact with Patient 02/06/12 2104      Chief Complaint  Patient presents with  . 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  She would like [place] to change her pain medication to "something stronger"  Patient is a 71 y.o. female presenting with leg pain. The history is provided by the patient. No language interpreter was used.  Leg Pain  The incident occurred more than 1 week ago. The incident occurred at home. There was no injury mechanism. The pain is present in the left knee and right knee. The quality of the pain is described as sharp. The pain is at a severity of 10/10. The pain is severe. The pain has been constant since onset. Associated symptoms include inability to bear weight and loss of motion. Pertinent negatives include no numbness, no muscle weakness, no loss of sensation and no tingling. She reports no foreign bodies present. The symptoms are aggravated by nothing.    No past medical history on file.  No past surgical history on file.  No family history on file.  History  Substance Use Topics  . Smoking status: Not on file  . Smokeless tobacco: Not on file  . Alcohol Use: Not on file    OB History    No data available      Review of Systems  Neurological: Negative for tingling and  numbness.  All other systems reviewed and are negative.    Allergies  Ace inhibitors; Chlorpromazine; and Haldol decanoate  Home Medications   Current Outpatient Rx  Name Route Sig Dispense Refill  . ACETAMINOPHEN 325 MG PO TABS Oral Take by mouth 2 (two) times daily. For pain or fever.    . ACETAMINOPHEN 500 MG PO TABS Oral Take 500 mg by mouth every 4 (four) hours as needed. Pain/discomfort.    Marland Kitchen BIMATOPROST 0.01 % OP SOLN Ophthalmic Apply 1 drop to eye at bedtime. Both eyes.    . COLLAGENASE 250 UNIT/GM EX OINT Topical Apply 1 application topically 2 (two) times daily.    Marland Kitchen DAKINS 0.4-0.5 % EX SOLN Topical Apply 1 mL topically daily. For decubitus right and left ischium    . DIPHENHYDRAMINE HCL 25 MG PO CAPS Oral Take 25 mg by mouth every 4 (four) hours as needed. Itching/swelling.    . INSULIN ASPART 100 UNIT/ML [place] SOLN Subcutaneous Inject 5 Units into the skin 3 (three) times daily before meals.     . INSULIN ASPART 100 UNIT/ML [place] SOLN Subcutaneous Inject 2-10 Units into the skin See admin instructions. Twice daily at 0630 and 1630 use sliding scale for blood sugar levels: 61-150: 0 Unit 151-200: 2 Unit 201-250: 4 Unit 251-300: 6 Unit 301-350: 8 Unit 351-400: 10 Unit >400: Call MD     . INSULIN DETEMIR  100 UNIT/ML [place] SOLN Subcutaneous Inject 55 Units into the skin at bedtime.    Marland Kitchen LORATADINE 10 MG PO TABS Oral Take 10 mg by mouth daily.    Marland Kitchen METHOCARBAMOL 500 MG PO TABS Oral Take 500 mg by mouth 2 (two) times daily. For muscle spasm.    . MULTI-VITAMIN/MINERALS PO TABS Oral Take 1 tablet by mouth daily.    Marland Kitchen OMEPRAZOLE 20 MG PO CPDR Oral Take 20 mg by mouth daily as needed. heartburn    . OVER THE COUNTER MEDICATION Oral Take 30 mLs by mouth as needed. Milk of Magnesia with Lactulose as needed for constipation.    . OXYCODONE HCL 5 MG PO CAPS Oral Take 5 mg by mouth See admin instructions. Patient may take every 6 hours scheduled for pain.  May also take every 6 hours AS NEEDED  for severe pain.    Marland Kitchen RISPERIDONE 2 MG PO TABS Oral Take 2 mg by mouth daily.    Marland Kitchen RISPERIDONE 3 MG PO TABS Oral Take 3 mg by mouth at bedtime.    Marland Kitchen SAXAGLIPTIN HCL 5 MG PO TABS Oral Take by mouth daily.    Marland Kitchen SIMETHICONE 80 MG PO CHEW Oral Chew 80 mg by mouth every 12 (twelve) hours as needed. For gas.    Marland Kitchen SIMVASTATIN 10 MG PO TABS Oral Take 10 mg by mouth at bedtime.    Marland Kitchen VITAMIN C 500 MG PO TABS Oral Take 500 mg by mouth 2 (two) times daily.      BP 128/70  Pulse 91  Temp(Src) 98.8 F (37.1 C) (Oral)  Resp 16  SpO2 99%  Physical Exam  Nursing note and vitals reviewed. Constitutional: She is oriented to person, place, and time. She appears well-developed and well-nourished. No distress.  HENT:  Head: Normocephalic and atraumatic.  Right Ear: External ear normal.  Left Ear: External ear normal.  Nose: Nose normal.  Mouth/Throat: Oropharynx is clear and moist. No oropharyngeal exudate.  Eyes: Conjunctivae are normal. Pupils are equal, round, and reactive to light. No scleral icterus.  Neck: Normal range of motion. Neck supple.  Cardiovascular: Normal rate, regular rhythm and normal heart sounds.  Exam reveals no gallop and no friction rub.   No murmur heard. Pulmonary/Chest: Effort normal and breath sounds normal. No respiratory distress. She exhibits no tenderness.  Abdominal: Soft. Bowel sounds are normal. She exhibits no distension. There is no tenderness.  Musculoskeletal:       Right knee: She exhibits decreased range of motion and bony tenderness. She exhibits no swelling, no effusion and no deformity. tenderness found.       Left knee: She exhibits decreased range of motion, swelling and bony tenderness. She exhibits no effusion, no laceration and no erythema. tenderness found.       Legs:      With noted foot drop  Lymphadenopathy:    She has no cervical adenopathy.  Neurological: She is alert and oriented to person, place, and time. No cranial nerve deficit.  Skin:  Skin is warm and dry. No rash noted. No erythema. No pallor.  Psychiatric: She has a normal mood and affect. Her behavior is normal. Judgment and thought content normal.    ED Course  Procedures (including critical care time)  Labs Reviewed - No data to display Dg Knee 2 Views Left  02/06/2012  *RADIOLOGY REPORT*  Clinical Data: Left knee pain  LEFT KNEE - 1-2 VIEW  Comparison: 05/07/2010 intraoperative radiograph  Findings: Status post  left knee arthroplasty with associated hardware and heterotopic bone formation.  No definite evidence for hardware complication.  No periprosthetic fracture identified.  No appreciable joint effusion. Osteopenia.  Advanced vascular calcification.  IMPRESSION: Left knee arthroplasty.  No definite evidence for hardware complication or acute osseous abnormality.  Original Report Authenticated By: Waneta Martins, M.D.     Chronic knee pain    MDM  Patient here with exacerbation of chronic bilateral knee pain left greater than right - reports mild improvement after medication - no fever or chills, do not suspect septic arthritis, DVT, hematarthrosis.        Izola Price [place], [place] 02/07/12 [phone]

## 2012-02-06 NOTE — ED Notes (Signed)
OVF:[id number]<IR> Expected date:02/06/12<BR> Expected time: 8:21 PM<BR> Means of arrival:Ambulance<BR> Comments:<BR> Leg pain

## 2012-02-07 ENCOUNTER — Encounter (HOSPITAL_COMMUNITY): Payer: Self-pay | Admitting: Emergency Medicine

## 2012-02-07 MED ORDER — OXYCODONE HCL 5 MG PO CAPS: 10.0000 mg | ORAL_CAPSULE | ORAL | Status: DC | PRN

## 2012-02-07 NOTE — ED Provider Notes (Signed)
Medical screening examination/treatment/procedure(s) were conducted as a shared visit with non-physician practitioner(s) and myself.  I personally evaluated the patient during the encounter  Flint Melter, MD 02/07/12 1303

## 2012-02-07 NOTE — ED Provider Notes (Signed)
Shannon Schmidt is a 71 y.o. female with a traumatic bilateral knee pain. Patient has findings consistent with arthritis. Doubt DVT, fracture, septic arthritis.  Medical screening examination/treatment/procedure(s) were conducted as a shared visit with non-physician practitioner(s) and myself.  I personally evaluated the patient during the encounter  Flint Melter, MD 02/07/12 1303

## 2012-02-07 NOTE — Discharge Instructions (Signed)
Arthralgia Your caregiver has diagnosed you as suffering from an arthralgia. Arthralgia means there is pain in a joint. This can come from many reasons including:  Bruising the joint which causes soreness (inflammation) in the joint.   Wear and tear on the joints which occur as we grow older (osteoarthritis).   Overusing the joint.   Various forms of arthritis.   Infections of the joint.  Regardless of the cause of pain in your joint, most of these different pains respond to anti-inflammatory drugs and rest. The exception to this is when a joint is infected, and these cases are treated with antibiotics, if it is a bacterial infection. HOME CARE INSTRUCTIONS   Rest the injured area for as long as directed by your caregiver. Then slowly start using the joint as directed by your caregiver and as the pain allows. Crutches as directed may be useful if the ankles, knees or hips are involved. If the knee was splinted or casted, continue use and care as directed. If an stretchy or elastic wrapping bandage has been applied today, it should be removed and re-applied every 3 to 4 hours. It should not be applied tightly, but firmly enough to keep swelling down. Watch toes and feet for swelling, bluish discoloration, coldness, numbness or excessive pain. If any of these problems (symptoms) occur, remove the ace bandage and re-apply more loosely. If these symptoms persist, contact your caregiver or return to this location.   For the first 24 hours, keep the injured extremity elevated on pillows while lying down.   Apply ice for 15 to 20 minutes to the sore joint every couple hours while awake for the first half day. Then 3 to 4 times per day for the first 48 hours. Put the ice in a plastic bag and place a towel between the bag of ice and your skin.   Wear any splinting, casting, elastic bandage applications, or slings as instructed.   Only take over-the-counter or prescription medicines for pain,  discomfort, or fever as directed by your caregiver. Do not use aspirin immediately after the injury unless instructed by your physician. Aspirin can cause increased bleeding and bruising of the tissues.   If you were given crutches, continue to use them as instructed and do not resume weight bearing on the sore joint until instructed.  Persistent pain and inability to use the sore joint as directed for more than 2 to 3 days are warning signs indicating that you should see a caregiver for a follow-up visit as soon as possible. Initially, a hairline fracture (break in bone) may not be evident on X-rays. Persistent pain and swelling indicate that further evaluation, non-weight bearing or use of the joint (use of crutches or slings as instructed), or further X-rays are indicated. X-rays may sometimes not show a small fracture until a week or 10 days later. Make a follow-up appointment with your own caregiver or one to whom we have referred you. A radiologist (specialist in reading X-rays) may read your X-rays. Make sure you know how you are to obtain your X-ray results. Do not assume everything is normal if you do not hear from us. SEEK MEDICAL CARE IF: Bruising, swelling, or pain increases. SEEK IMMEDIATE MEDICAL CARE IF:   Your fingers or toes are numb or blue.   The pain is not responding to medications and continues to stay the same or get worse.   The pain in your joint becomes severe.   You develop a fever over   102 F (38.9 C).   It becomes impossible to move or use the joint.  MAKE SURE YOU:   Understand these instructions.   Will watch your condition.   Will get help right away if you are not doing well or get worse.  Document Released: 11/14/2005 Document Revised: 11/03/2011 Document Reviewed: 07/02/2008 [facility] Patient Information 2012 [place], [place].Chronic Pain Chronic pain can be defined as pain that is lasting, off and on, and lasts for 3 to 6 months or longer. Many things  cause chronic pain, which can make it difficult to make a discrete diagnosis. There are many treatment options available for chronic pain. However, finding a treatment that works well for you may require trying various approaches until a suitable one is found. CAUSES  In some types of chronic medical conditions, the pain is caused by a normal pain response within the body. A normal pain response helps the body identify illness or injury and prevent further damage from being done. In these cases, the cause of the pain may be identified and treated, even if it may not be cured completely. Examples of chronic conditions which can cause chronic pain include:  Inflammation of the joints (arthritis).   Back pain or neck pain (including bulging or herniated disks).   Migraine headaches.   Cancer.  In some other types of chronic pain syndromes, the pain is caused by an abnormal pain response within the body. An abnormal pain response is present when there is no ongoing cause (or stimulus) for the pain, or when the cause of the pain is arising from the nerves or nervous system itself. Examples of conditions which can cause chronic pain due to an abnormal pain response include:  Fibromyalgia.   Reflex sympathetic dystrophy (RSD).   Neuropathy (when the nerves themselves are damaged, and may cause pain).  DIAGNOSIS  Your caregiver will help diagnose your condition over time. In many cases, the initial focus will be on excluding conditions that could be causing the pain. Depending on your symptoms, your caregiver may order some tests to diagnose your condition. Some of these tests include:  Blood tests.   Computerized X-ray scans (CT scan).   Computerized magnetic scans (MRI).   X-rays.   Ultrasounds.   Nerve conduction studies.   Consultation with other physicians or specialists.  TREATMENT  There are many treatment options for people suffering from chronic pain. Finding a treatment that  works well may take time.   You may be referred to a pain management specialist.   You may be put on medication to help with the pain. Unfortunately, some medications (such as opiate medications) may not be very effective in cases where chronic pain is due to abnormal pain responses. Finding the right medications can take some time.   Adjunctive therapies may be used to provide additional relief and improve a patient's quality of life. These therapies include:   Mindfulness meditation.   Acupuncture.   Biofeedback.   Cognitive-behavioral therapy.   In certain cases, surgical interventions may be attempted.  HOME CARE INSTRUCTIONS   Make sure you understand these instructions prior to discharge.   Ask any questions and share any further concerns you have with your caregiver prior to discharge.   Take all medications as directed by your caregiver.   Keep all follow-up appointments.  SEEK MEDICAL CARE IF:   Your pain gets worse.   You develop a new pain that was not present before.   You cannot tolerate any  medications prescribed by your caregiver.   You develop new symptoms since your last visit with your caregiver.  SEEK IMMEDIATE MEDICAL CARE IF:   You develop muscular weakness.   You have decreased sensation or numbness.   You lose control of bowel or bladder function.   Your pain suddenly gets much worse.   You have an oral temperature above 102 F (38.9 C), not controlled by medication.   You develop shaking chills, confusion, chest pain, or shortness of breath.  Document Released: 08/06/2002 Document Revised: 11/03/2011 Document Reviewed: 11/12/2008 [facility] Patient Information 2012 [place], [place].

## 2012-02-27 ENCOUNTER — Encounter (HOSPITAL_BASED_OUTPATIENT_CLINIC_OR_DEPARTMENT_OTHER): Payer: Medicare Other

## 2012-03-19 ENCOUNTER — Encounter (HOSPITAL_BASED_OUTPATIENT_CLINIC_OR_DEPARTMENT_OTHER): Payer: Medicare Other

## 2012-03-19 ENCOUNTER — Encounter (HOSPITAL_BASED_OUTPATIENT_CLINIC_OR_DEPARTMENT_OTHER): Payer: Medicare Other | Attending: Internal Medicine

## 2012-03-19 DIAGNOSIS — L89309 Pressure ulcer of unspecified buttock, unspecified stage: Secondary | ICD-10-CM | POA: Insufficient documentation

## 2012-03-19 DIAGNOSIS — L8992 Pressure ulcer of unspecified site, stage 2: Secondary | ICD-10-CM | POA: Insufficient documentation

## 2012-04-09 ENCOUNTER — Encounter (HOSPITAL_BASED_OUTPATIENT_CLINIC_OR_DEPARTMENT_OTHER): Payer: Medicare Other | Attending: Internal Medicine

## 2012-04-09 DIAGNOSIS — E119 Type 2 diabetes mellitus without complications: Secondary | ICD-10-CM | POA: Insufficient documentation

## 2012-04-09 DIAGNOSIS — J449 Chronic obstructive pulmonary disease, unspecified: Secondary | ICD-10-CM | POA: Insufficient documentation

## 2012-04-09 DIAGNOSIS — L8993 Pressure ulcer of unspecified site, stage 3: Secondary | ICD-10-CM | POA: Insufficient documentation

## 2012-04-09 DIAGNOSIS — J4489 Other specified chronic obstructive pulmonary disease: Secondary | ICD-10-CM | POA: Insufficient documentation

## 2012-04-09 DIAGNOSIS — L89309 Pressure ulcer of unspecified buttock, unspecified stage: Secondary | ICD-10-CM | POA: Insufficient documentation

## 2012-04-09 DIAGNOSIS — Z79899 Other long term (current) drug therapy: Secondary | ICD-10-CM | POA: Insufficient documentation

## 2012-04-18 ENCOUNTER — Other Ambulatory Visit: Payer: Self-pay | Admitting: Ophthalmology

## 2012-05-03 ENCOUNTER — Encounter (HOSPITAL_COMMUNITY): Payer: Self-pay | Admitting: Pharmacy Technician

## 2012-05-07 ENCOUNTER — Encounter (HOSPITAL_BASED_OUTPATIENT_CLINIC_OR_DEPARTMENT_OTHER): Payer: Medicare Other

## 2012-05-07 ENCOUNTER — Other Ambulatory Visit: Payer: Self-pay | Admitting: Ophthalmology

## 2012-05-07 NOTE — H&P (Signed)
SUBJECTIVE:   Referred By:  Patient seen at [facility] Complaint:  Cataract OD interfering with vision and lifestyle.   OBJECTIVE:   HISTORY OF PRESENT ILLNESS:  Onset: months  Duration:  months  Quality:   worse  Location:  right  Intensity / Severity:  severe   Context / When:   been treated for before (OS phacoemulsification with intraocular lens implant  - date unknown)  Mental Status: Oriented to Time, Person, Place   REVIEW OF SYSTEMS:   GEN: DIABETIC  Denies weight changes, appetite changes, unusual weakness, bleeding, fever, chills, recent trauma or infections  HEENT: Denies vision changes (except as noted above), hearing changes, epistaxis, unusual sneezing, sore throat, swallowing difficulties, ear pain, or facial pain.  NECK: Denies neck pain swelling or stiffness.  LUNGS: Denies cough, dyspnea, orthopnea, or hemoptosis.  HEART: Denies palpitations or chest pain.  ABD: Denies abdomen pain, eructation, nausea, vomiting, hematemesis, diarrhea, constipation, hematochezia, melena, acholic stools, or flatulence.  GENT: Denies recent dysruia, urine frequency, urine hesitancy, urine urgency, urine flow-slow, urine retention, nocturia, polyuria, dark urine, or incontinence.  BJE: Denies arthralgia, joint stiffness, back pain, muscle cramps, or myalgia.  SKIN: Denies rashes, lesions, anhidrosis, bruising, or pruritus.  NEURO: Denies memory loss, disorientation, syncope, diplopia, dizziness, vertigo, clumsiness, paresthesias or cephalgia.  SCHIZOPHRENIC /BIPOLAR       Vision:   OD: Acuity:  [place]:  CF OS: Acuity:  [place]  20/25  Habitual Prescription: OD: none OS:  AutoRefraction: OD:  OS:   Refraction: OD:  OS  Visual Fields:  OD: Visual Fields:  OS: Visual Fields:   Lids and Lashes:    Motility:      SLIT-LAMP EXAMINATION  Conjunctiva OD: Conjunctiva:  Clear  OS: Conjunctiva:  Clear   Pupils: OD: Pupils:     millimeters (-)  Page Spiro OS: Pupils:     millimeters (-) Page Spiro    Iris: OD: IrisManson Passey  OS: Iris:  Brown   Cornea: OD:   Cornea:  OS: Cornea:    Anterior Chamber: OD: Anterior Chamber Deep and Quiet  OS: Anterior Chamber Deep and Quiet    Lens: OD: Hyper Mature  OS: Posterior Chamber IntraOcular Lens     Intra-Ocular Pressures in mm of [place]:  Target: Today: OD: 17 OS: 17 Time: 10am  Central Corneal Thickness:   Gonio: OD: Gonio   OS: Gonio      Dilation :   Condensing Lens(es) Used:   Vitreous: OD: Vitreous:  Clear OS: Vitreous:  Clear  Optic Discs: OD: Optic Discs   OS: Optic Discs    Macula: OD: Macula: Clear OS: Macula: Clear  Retina And Vessels: OD: Retina and Vessels Clear OS: Retina and Vessels Clear  Periphery: OD: Periphery Clear OS: Periphery  Clear     HEALTH AND PHYSICAL  Blood Pressure:   Pulse:  Respiration:  GENERAL: No acute distress  HEENT: MM's WNL, TM's normal, PERLA, Conjunctiva WNL, Fundi  As Before  note above  NECK: Full ROM, no adenopathy or masses, Thyroid WNL  LUNGS: Clear to auscultation, equal BS  HEART: RR&R, no murmur, gallop, or rub, not enlarged  ABD: Normal  GENT: deferred  BJE: normal  NEURO: CN II through XII intact  SKIN: normal  STUDIES:  A-Scan   B-Scan (to R/O RD)   ASSESSMENT:   B Scan reveals NO RD  Moderate stage glaucoma   ICD#365.72    Nuclear senile cataract   ICD#366.16  OD Pseudophakia   ICD#446.0     PLAN:  Discussed guarded prognosis with patient extensively. Patient apprized of risks and benefits of cataract surgery and elected to proceed.  Due to the density of this cataract, ECCE may be perfomed OR phacoemulsification with intraocular lens implant  WITH TRYPAN BLUE TO ENHANCE VISABILITY.

## 2012-05-08 ENCOUNTER — Encounter (HOSPITAL_COMMUNITY): Payer: Self-pay | Admitting: *Deleted

## 2012-05-09 ENCOUNTER — Ambulatory Visit (HOSPITAL_COMMUNITY): Payer: Medicare Other | Admitting: Anesthesiology

## 2012-05-09 ENCOUNTER — Encounter (HOSPITAL_COMMUNITY)
Admission: RE | Disposition: A | Discharge status: Nursing Home (Includes ICF) | Payer: Self-pay | Source: Ambulatory Visit | Attending: Ophthalmology

## 2012-05-09 ENCOUNTER — Ambulatory Visit (HOSPITAL_COMMUNITY)
Admission: RE | Admit: 2012-05-09 | Discharge: 2012-05-09 | Disposition: A | Discharge status: Other Acute Inpatient Hospital | Payer: Medicare Other | Source: Ambulatory Visit | Attending: Ophthalmology | Admitting: Ophthalmology

## 2012-05-09 ENCOUNTER — Encounter (HOSPITAL_COMMUNITY): Payer: Self-pay | Admitting: Anesthesiology

## 2012-05-09 ENCOUNTER — Encounter (HOSPITAL_COMMUNITY): Payer: Self-pay | Admitting: *Deleted

## 2012-05-09 DIAGNOSIS — H409 Unspecified glaucoma: Secondary | ICD-10-CM | POA: Insufficient documentation

## 2012-05-09 DIAGNOSIS — Z961 Presence of intraocular lens: Secondary | ICD-10-CM | POA: Insufficient documentation

## 2012-05-09 DIAGNOSIS — H2510 Age-related nuclear cataract, unspecified eye: Secondary | ICD-10-CM | POA: Insufficient documentation

## 2012-05-09 HISTORY — DX: Depression, unspecified: F32.A

## 2012-05-09 HISTORY — DX: Rheumatoid arthritis, unspecified: M06.9

## 2012-05-09 HISTORY — DX: Colostomy status: Z93.3

## 2012-05-09 HISTORY — DX: Personal history of other diseases of the digestive system: Z87.19

## 2012-05-09 HISTORY — DX: Chronic obstructive pulmonary disease, unspecified: J44.9

## 2012-05-09 HISTORY — DX: Anemia, unspecified: D64.9

## 2012-05-09 HISTORY — DX: Gastro-esophageal reflux disease without esophagitis: K21.9

## 2012-05-09 HISTORY — DX: Major depressive disorder, single episode, unspecified: F32.9

## 2012-05-09 HISTORY — DX: Other specified postprocedural states: Z98.890

## 2012-05-09 HISTORY — DX: Nausea with vomiting, unspecified: R11.2

## 2012-05-09 HISTORY — PX: CATARACT EXTRACTION W/PHACO: SHX586

## 2012-05-09 LAB — CBC
HCT: 36.2 % (ref 36.0–46.0)
Hemoglobin: 11.6 g/dL — ABNORMAL LOW (ref 12.0–15.0)
MCH: 28 pg (ref 26.0–34.0)
MCHC: 32 g/dL (ref 30.0–36.0)
RDW: 15.5 % (ref 11.5–15.5)

## 2012-05-09 LAB — BASIC METABOLIC PANEL
BUN: 8 mg/dL (ref 6–23)
Chloride: 100 mEq/L (ref 96–112)
Creatinine, Ser: 0.45 mg/dL — ABNORMAL LOW (ref 0.50–1.10)
GFR calc Af Amer: >90 mL/min (ref >90)
GFR calc non Af Amer: >90 mL/min (ref >90)
Glucose, Bld: 101 mg/dL — ABNORMAL HIGH (ref 70–99)
Potassium: 3.6 mEq/L (ref 3.5–5.1)

## 2012-05-09 SURGERY — PHACOEMULSIFICATION, CATARACT, WITH IOL INSERTION
Anesthesia: Monitor Anesthesia Care | Site: Eye | Laterality: Right | Wound class: Clean

## 2012-05-09 MED ORDER — ACETYLCHOLINE CHLORIDE 1:100 IO SOLR
INTRAOCULAR | Status: DC | PRN
  Administered 2012-05-09: 20 mg via INTRAOCULAR

## 2012-05-09 MED ORDER — CYCLOPENTOLATE-PHENYLEPHRINE 0.2-1 % OP SOLN
1.0000 [drp] | OPHTHALMIC | Status: AC
  Administered 2012-05-09 (×2): 1 [drp] via OPHTHALMIC
  Filled 2012-05-09: qty 2

## 2012-05-09 MED ORDER — MIDAZOLAM HCL 5 MG/5ML IJ SOLN
INTRAMUSCULAR | Status: DC | PRN
  Administered 2012-05-09: 0.5 mg via INTRAVENOUS

## 2012-05-09 MED ORDER — SODIUM HYALURONATE 10 MG/ML IO SOLN
INTRAOCULAR | Status: DC | PRN
  Administered 2012-05-09: 0.85 mL via INTRAOCULAR

## 2012-05-09 MED ORDER — SODIUM CHLORIDE 0.9 % IV SOLN
INTRAVENOUS | Status: DC
  Administered 2012-05-09: 12:00:00 via INTRAVENOUS

## 2012-05-09 MED ORDER — TOBRAMYCIN 0.3 % OP OINT
TOPICAL_OINTMENT | OPHTHALMIC | Status: DC | PRN
  Administered 2012-05-09: 1 via OPHTHALMIC

## 2012-05-09 MED ORDER — PROPOFOL 10 MG/ML IV EMUL
INTRAVENOUS | Status: DC | PRN
  Administered 2012-05-09: 80 mg via INTRAVENOUS

## 2012-05-09 MED ORDER — BUPIVACAINE HCL 0.75 % IJ SOLN
INTRAMUSCULAR | Status: DC | PRN
  Administered 2012-05-09: 10 mL

## 2012-05-09 MED ORDER — SODIUM CHLORIDE 0.9 % IV SOLN
INTRAVENOUS | Status: DC | PRN
  Administered 2012-05-09 (×2): via INTRAVENOUS

## 2012-05-09 MED ORDER — KETOROLAC TROMETHAMINE 0.5 % OP SOLN
1.0000 [drp] | OPHTHALMIC | Status: AC
  Administered 2012-05-09 (×3): 1 [drp] via OPHTHALMIC
  Filled 2012-05-09: qty 5

## 2012-05-09 MED ORDER — PHENYLEPHRINE HCL 2.5 % OP SOLN
1.0000 [drp] | OPHTHALMIC | Status: AC
  Administered 2012-05-09 (×2): 1 [drp] via OPHTHALMIC
  Filled 2012-05-09: qty 3

## 2012-05-09 MED ORDER — LIDOCAINE HCL 2 % IJ SOLN
INTRAMUSCULAR | Status: DC | PRN
  Administered 2012-05-09: 20 mL

## 2012-05-09 MED ORDER — NA CHONDROIT SULF-NA HYALURON 40-30 MG/ML IO SOLN
INTRAOCULAR | Status: DC | PRN
  Administered 2012-05-09 (×2): 0.5 mL via INTRAOCULAR

## 2012-05-09 MED ORDER — EPINEPHRINE HCL 1 MG/ML IJ SOLN
INTRAMUSCULAR | Status: DC | PRN
  Administered 2012-05-09: 1 mg

## 2012-05-09 MED ORDER — BSS IO SOLN
INTRAOCULAR | Status: DC | PRN
  Administered 2012-05-09: 500 mL via INTRAOCULAR

## 2012-05-09 MED ORDER — BALANCED SALT IO SOLN
INTRAOCULAR | Status: DC | PRN
  Administered 2012-05-09: 15 mL via INTRAOCULAR

## 2012-05-09 MED ORDER — HYALURONIDASE OVINE 200 UNIT/ML IJ SOLN
INTRAMUSCULAR | Status: DC | PRN
  Administered 2012-05-09: 150 [IU] via SUBCUTANEOUS

## 2012-05-09 SURGICAL SUPPLY — 43 items
APPLICATOR COTTON TIP 6IN STRL (MISCELLANEOUS) ×2 IMPLANT
APPLICATOR DR MATTHEWS STRL (MISCELLANEOUS) ×2 IMPLANT
BANDAGE GAUZE ELAST BULKY 4 IN (GAUZE/BANDAGES/DRESSINGS) ×4 IMPLANT
BLADE KERATOME 2.75 (BLADE) ×2 IMPLANT
BLADE MINI RND TIP GREEN BEAV (BLADE) ×2 IMPLANT
BLADE STAB KNIFE 45DEG (BLADE) IMPLANT
CANNULA ANTERIOR CHAMBER 27GA (MISCELLANEOUS) ×2 IMPLANT
CLOTH BEACON ORANGE TIMEOUT ST (SAFETY) ×2 IMPLANT
CORDS BIPOLAR (ELECTRODE) ×2 IMPLANT
DRAPE OPHTHALMIC 40X48 W POUCH (DRAPES) ×2 IMPLANT
DRAPE RETRACTOR (MISCELLANEOUS) ×2 IMPLANT
FILTER BLUE MILLIPORE (MISCELLANEOUS) IMPLANT
GLOVE BIO SURGEON STRL SZ8 (GLOVE) ×4 IMPLANT
GLOVE BIOGEL PI IND STRL 6.5 (GLOVE) ×1 IMPLANT
GLOVE BIOGEL PI INDICATOR 6.5 (GLOVE) ×1
GLOVE SURG SS PI 6.5 STRL IVOR (GLOVE) ×4 IMPLANT
GOWN STRL NON-REIN LRG LVL3 (GOWN DISPOSABLE) ×6 IMPLANT
KIT BASIN OR (CUSTOM PROCEDURE TRAY) ×2 IMPLANT
KIT ROOM TURNOVER OR (KITS) ×2 IMPLANT
KNIFE CRESCENT 2.5 55 ANG (BLADE) IMPLANT
LENS IOL ACRSF IQ PC 17.5 (Intraocular Lens) ×1 IMPLANT
LENS IOL ACRYSOF IQ POST 17.5 (Intraocular Lens) ×2 IMPLANT
MARKER SKIN DUAL TIP RULER LAB (MISCELLANEOUS) IMPLANT
MASK EYE SHIELD (GAUZE/BANDAGES/DRESSINGS) ×2 IMPLANT
NEEDLE 18GX1X1/2 (RX/OR ONLY) (NEEDLE) ×2 IMPLANT
NEEDLE 25GX 5/8IN NON SAFETY (NEEDLE) ×4 IMPLANT
NS IRRIG 1000ML POUR BTL (IV SOLUTION) ×2 IMPLANT
PACK CATARACT CUSTOM (CUSTOM PROCEDURE TRAY) ×2 IMPLANT
PACK CATARACT MCHSCP (PACKS) ×2 IMPLANT
PAD ARMBOARD 7.5X6 YLW CONV (MISCELLANEOUS) ×2 IMPLANT
PAD EYE OVAL STERILE LF (GAUZE/BANDAGES/DRESSINGS) ×2 IMPLANT
PROBE ANTERIOR 20G W/INFUS NDL (MISCELLANEOUS) IMPLANT
RING MALYGIN (MISCELLANEOUS) ×2 IMPLANT
SPEAR EYE SURG WECK-CEL (MISCELLANEOUS) IMPLANT
SUT ETHILON 10 0 CS140 6 (SUTURE) ×2 IMPLANT
SUT SILK 4 0 C 3 735G (SUTURE) IMPLANT
SUT SILK 6 0 G 6 (SUTURE) ×2 IMPLANT
SUT VICRYL 8 0 TG140 8 (SUTURE) IMPLANT
SYR 3ML LL SCALE MARK (SYRINGE) IMPLANT
TAPE SURG TRANSPORE 1 IN (GAUZE/BANDAGES/DRESSINGS) ×1 IMPLANT
TAPE SURGICAL TRANSPORE 1 IN (GAUZE/BANDAGES/DRESSINGS) ×1
TOWEL OR 17X24 6PK STRL BLUE (TOWEL DISPOSABLE) ×4 IMPLANT
WATER STERILE IRR 1000ML POUR (IV SOLUTION) ×2 IMPLANT

## 2012-05-09 NOTE — Transfer of Care (Signed)
Immediate Anesthesia Transfer of Care Note  Patient: Shannon Schmidt  Procedure(s) Performed: Procedure(s) (LRB): CATARACT EXTRACTION PHACO AND INTRAOCULAR LENS PLACEMENT (IOC) (Right)  Patient Location: Short Stay  Anesthesia Type: MAC  Level of Consciousness: awake, alert , oriented and patient cooperative  Airway & Oxygen Therapy: Patient Spontanous Breathing  Post-op Assessment: Post -op Vital signs reviewed and stable, Patient moving all extremities and reported to Shireen Quan, RN, in short stay.  Post vital signs: Reviewed and stable  Complications: No apparent anesthesia complications

## 2012-05-09 NOTE — Progress Notes (Signed)
Dr whitiker called for order to be put in epic

## 2012-05-09 NOTE — Discharge Instructions (Addendum)
Instructions Following General Anesthetic, Adult A nurse specialized in giving anesthesia (anesthetist) or a doctor specialized in giving anesthesia (anesthesiologist) gave you a medicine that made you sleep while a procedure was performed. For as long as 24 hours following this procedure, you may feel:  Dizzy.   Weak.   Drowsy.  AFTER THE PROCEDURE After surgery, you will be taken to the recovery area where a nurse will monitor your progress. You will be allowed to go home when you are awake, stable, taking fluids well, and without complications. For the first 24 hours following an anesthetic:  Have a responsible person with you.   Do not drive a car. If you are alone, do not take public transportation.   Do not drink alcohol.   Do not take medicine that has not been prescribed by your caregiver.   Do not sign important papers or make important decisions.   You may resume normal diet and activities as directed.   Change bandages (dressings) as directed.   Only take over-the-counter or prescription medicines for pain, discomfort, or fever as directed by your caregiver.  If you have questions or problems that seem related to the anesthetic, call the hospital and ask for the anesthetist or anesthesiologist on call. SEEK IMMEDIATE MEDICAL CARE IF:   You develop a rash.   You have difficulty breathing.   You have chest pain.   You develop any allergic problems.  Document Released: 02/20/2001 Document Revised: 11/03/2011 Document Reviewed: 10/01/2007 [employer] Patient Information 2012 [employer]. 

## 2012-05-09 NOTE — Preoperative (Signed)
Beta Blockers   Reason not to administer Beta Blockers:Not Applicable 

## 2012-05-09 NOTE — H&P (View-Only) (Signed)
SUBJECTIVE:   Referred By:  Patient seen at [facility]   Chief Complaint:  Cataract OD interfering with vision and lifestyle.   OBJECTIVE:   HISTORY OF PRESENT ILLNESS:  Onset: months  Duration:  months  Quality:   worse  Location:  right  Intensity / Severity:  severe   Context / When:   been treated for before (OS phacoemulsification with intraocular lens implant  - date unknown)  Mental Status: Oriented to Time, Person, Place   REVIEW OF SYSTEMS:   GEN: DIABETIC  Denies weight changes, appetite changes, unusual weakness, bleeding, fever, chills, recent trauma or infections  HEENT: Denies vision changes (except as noted above), hearing changes, epistaxis, unusual sneezing, sore throat, swallowing difficulties, ear pain, or facial pain.  NECK: Denies neck pain swelling or stiffness.  LUNGS: Denies cough, dyspnea, orthopnea, or hemoptosis.  HEART: Denies palpitations or chest pain.  ABD: Denies abdomen pain, eructation, nausea, vomiting, hematemesis, diarrhea, constipation, hematochezia, melena, acholic stools, or flatulence.  GENT: Denies recent dysruia, urine frequency, urine hesitancy, urine urgency, urine flow-slow, urine retention, nocturia, polyuria, dark urine, or incontinence.  BJE: Denies arthralgia, joint stiffness, back pain, muscle cramps, or myalgia.  SKIN: Denies rashes, lesions, anhidrosis, bruising, or pruritus.  NEURO: Denies memory loss, disorientation, syncope, diplopia, dizziness, vertigo, clumsiness, paresthesias or cephalgia.  SCHIZOPHRENIC /BIPOLAR       Vision:   OD: Acuity:  [place]:  CF OS: Acuity:  [place]  20/25  Habitual Prescription: OD: none OS:  AutoRefraction: OD:  OS:   Refraction: OD:  OS  Visual Fields:  OD: Visual Fields:  OS: Visual Fields:   Lids and Lashes:    Motility:      SLIT-LAMP EXAMINATION  Conjunctiva OD: Conjunctiva:  Clear  OS: Conjunctiva:  Clear   Pupils: OD: Pupils:     millimeters (-)  Marcus Gunn OS: Pupils:     millimeters (-) Marcus Gunn    Iris: OD: Iris:  Brown  OS: Iris:  Brown   Cornea: OD:   Cornea:  OS: Cornea:    Anterior Chamber: OD: Anterior Chamber Deep and Quiet  OS: Anterior Chamber Deep and Quiet    Lens: OD: Hyper Mature  OS: Posterior Chamber IntraOcular Lens     Intra-Ocular Pressures in mm of [place]:  Target: Today: OD: 17 OS: 17 Time: 10am  Central Corneal Thickness:   Gonio: OD: Gonio   OS: Gonio      Dilation :   Condensing Lens(es) Used:   Vitreous: OD: Vitreous:  Clear OS: Vitreous:  Clear  Optic Discs: OD: Optic Discs   OS: Optic Discs    Macula: OD: Macula: Clear OS: Macula: Clear  Retina And Vessels: OD: Retina and Vessels Clear OS: Retina and Vessels Clear  Periphery: OD: Periphery Clear OS: Periphery  Clear     HEALTH AND PHYSICAL  Blood Pressure:   Pulse:  Respiration:  GENERAL: No acute distress  HEENT: MM's WNL, TM's normal, PERLA, Conjunctiva WNL, Fundi  As Before  note above  NECK: Full ROM, no adenopathy or masses, Thyroid WNL  LUNGS: Clear to auscultation, equal BS  HEART: RR&R, no murmur, gallop, or rub, not enlarged  ABD: Normal  GENT: deferred  BJE: normal  NEURO: CN II through XII intact  SKIN: normal  STUDIES:  A-Scan   B-Scan (to R/O RD)   ASSESSMENT:   B Scan reveals NO RD  Moderate stage glaucoma   ICD#365.72    Nuclear senile cataract   ICD#366.16    OD Pseudophakia   ICD#446.0     PLAN:  Discussed guarded prognosis with patient extensively. Patient apprized of risks and benefits of cataract surgery and elected to proceed.  Due to the density of this cataract, ECCE may be perfomed OR phacoemulsification with intraocular lens implant  WITH TRYPAN BLUE TO ENHANCE VISABILITY. 

## 2012-05-09 NOTE — Progress Notes (Signed)
Report called to Geronimo Running, LPN at [facility]

## 2012-05-09 NOTE — Op Note (Signed)
Shannon Schmidt note: Preoperative diagnosis mature cataract right eye with very poor vision Postoperative diagnosis the same mature cataract with poorly dilating pupil requiring special devices to stretch the pupil open Anesthesia: 2% Xylocaine in a 50-50 mixture of 0.75% Marcaine with an ampule Wydase Complications: None Procedure: The patient was taken to the operating room where was noted that the patient was somewhat agitated after the patient had been given mild sedation she was given a peribulbar block with the aforementioned local anesthetic agent. Following this the patient's face was prepped and draped in the usual sterile fashion with a surgeon sitting superiorly it was noted that the pupil was only 4 mm despite multiple eye drops therefore it was chosen to perform an extracapsular cataract extraction with sphincterotomy after stretching the pupil. A crescent blade was used to make a partial-thickness bevel scleral incision extending from 10:00 to 1:30 after the conjunctiva had been recessed and bleeding was controlled with cautery following this a 15 week blade was passed through clear cornea and Viscoat was injected in the eye a 2.75 mm keratome blade was passed into the anterior chamber. 2 Kuglen hooks were used to stretch the pupil after Viscoat had been injected in the eye the pupil remained at 5.5 mm therefore 3 small sphincterotomies were performed at the pupillary margin additional Viscoat was injected and the pupil was noted to be 6 mm a can opener capsulotomy was formed with a bent 25-gauge needle and anterior capsule was removed with the Utrata forceps. BSS was used to hydrodissect and loosen the nucleus following this a 6-0 nylon suture was passed through the cornea at o'clock position corneal scleral scissors were used to the left and right to open a 10 mm incision following this the cornea was elevated a muscle hook was used to elevate the nucleus and irrigating lens loop was passed beneath  the nucleus and anti-nucleus was removed from the eye without complications following this 2 interrupted 6-0 silk sutures were placed and 2 interrupted 10-0 nylon sutures were placed the I/A was performed to remove cortical fibers from the anterior chamber the posterior capsule remained intact therefore the intraocular lens implant was chosen which was an Alcon AcrySof IQ lens 17.5 diopter S. model number SN 60 WF SN #[id number] was injected and unfolded into the capsular bag and positioned with a Kuglen hook additional 10-0 nylon sutures were placed the I/A was used to remove viscoelastic from the eye 6 total 10-0 nylon sutures were placed and the 6-0 sutures were removed, the eye was pressurized and there was no leakage. At this point all instrumentation removed from the eye the patient was somewhat agitated by the end of the case all sutures were buried and covered by the conjunctiva at the incision topical TobraDex ointment was applied to the eye a patch and Fox  shield. were placed and the patient returned to recovery area in stable condition Complications: None

## 2012-05-09 NOTE — Progress Notes (Signed)
[place] [facility] [employer]iological scientist called for transport

## 2012-05-09 NOTE — Anesthesia Procedure Notes (Signed)
Procedure Name: MAC Date/Time: 05/09/2012 1:53 PM Performed by: Rogelia Boga Pre-anesthesia Checklist: Patient identified, Emergency Drugs available, Suction available, Patient being monitored and Timeout performed Patient Re-evaluated:Patient Re-evaluated prior to inductionOxygen Delivery Method: Nasal cannula Preoxygenation: Pre-oxygenation with 100% oxygen Intubation Type: IV induction Placement Confirmation: positive ETCO2

## 2012-05-09 NOTE — Interval H&P Note (Signed)
History and Physical Interval Note:  05/09/2012 1:11 PM  Shannon Schmidt  has presented today for surgery, with the diagnosis of Cataract Right Eye  The various methods of treatment have been discussed with the patient and family. After consideration of risks, benefits and other options for treatment, the patient has consented to  Procedure(s) (LRB): CATARACT EXTRACTION PHACO AND INTRAOCULAR LENS PLACEMENT (IOC) (Right) as a surgical intervention .  The patients' history has been reviewed, patient examined, no change in status, stable for surgery.  I have reviewed the patients' chart and labs.  Questions were answered to the patient's satisfaction.     Penelopi Mikrut

## 2012-05-09 NOTE — Progress Notes (Signed)
Patient discharged with PTAR  

## 2012-05-09 NOTE — Anesthesia Preprocedure Evaluation (Addendum)
Anesthesia Evaluation  Patient identified by MRN, date of birth, ID band Patient awake    Reviewed: Allergy & Precautions, H&P , NPO status , Patient's Chart, lab work & pertinent test results  History of Anesthesia Complications (+) PONV  Airway Mallampati: II TM Distance: >3 FB Neck ROM: full    Dental  (+) Edentulous Upper and Edentulous Lower   Pulmonary COPDformer smoker         Cardiovascular     Neuro/Psych Depression Bipolar Disorder Schizophrenia    GI/Hepatic hiatal hernia, GERD-  Controlled,  Endo/Other  Diabetes mellitus-, Type 2  Renal/GU      Musculoskeletal  (+) Arthritis -, Rheumatoid disorders,    Abdominal   Peds  Hematology   Anesthesia Other Findings   Reproductive/Obstetrics                          Anesthesia Physical Anesthesia Plan  ASA: III  Anesthesia Plan: MAC   Post-op Pain Management:    Induction: Intravenous  Airway Management Planned: Nasal Cannula  Additional Equipment:   Intra-op Plan:   Post-operative Plan:   Informed Consent: I have reviewed the patients History and Physical, chart, labs and discussed the procedure including the risks, benefits and alternatives for the proposed anesthesia with the patient or authorized representative who has indicated his/her understanding and acceptance.     Plan Discussed with: CRNA, Surgeon and Anesthesiologist  Anesthesia Plan Comments:        Anesthesia Quick Evaluation

## 2012-05-11 ENCOUNTER — Encounter (HOSPITAL_COMMUNITY): Payer: Self-pay | Admitting: Ophthalmology

## 2012-05-14 NOTE — Anesthesia Postprocedure Evaluation (Signed)
Anesthesia Post Note  Patient: Shannon Schmidt  Procedure(s) Performed: Procedure(s) (LRB): CATARACT EXTRACTION PHACO AND INTRAOCULAR LENS PLACEMENT (IOC) (Right)  Anesthesia type: MAC  Patient location: PACU  Post pain: Pain level controlled and Adequate analgesia  Post assessment: Post-op Vital signs reviewed, Patient's Cardiovascular Status Stable and Respiratory Function Stable  Last Vitals:  Filed Vitals:   05/09/12 1527  BP: 149/84  Pulse: 97  Temp: 36.6 C  Resp: 16    Post vital signs: Reviewed and stable  Level of consciousness: awake, alert  and oriented  Complications: No apparent anesthesia complications

## 2012-05-15 ENCOUNTER — Ambulatory Visit (HOSPITAL_COMMUNITY): Admission: RE | Admit: 2012-05-15 | Payer: Medicare Other | Source: Ambulatory Visit | Admitting: Ophthalmology

## 2012-05-15 ENCOUNTER — Encounter (HOSPITAL_COMMUNITY): Admission: RE | Payer: Self-pay | Source: Ambulatory Visit

## 2012-05-15 SURGERY — PHACOEMULSIFICATION, CATARACT, WITH IOL INSERTION
Anesthesia: Monitor Anesthesia Care | Laterality: Right

## 2012-05-16 ENCOUNTER — Other Ambulatory Visit: Payer: Self-pay | Admitting: Ophthalmology

## 2012-05-17 ENCOUNTER — Ambulatory Visit: Admit: 2012-05-17 | Payer: Self-pay | Admitting: Ophthalmology

## 2012-05-17 SURGERY — PHACOEMULSIFICATION, CATARACT, WITH IOL INSERTION
Anesthesia: Monitor Anesthesia Care | Laterality: Left

## 2012-05-21 ENCOUNTER — Encounter (HOSPITAL_BASED_OUTPATIENT_CLINIC_OR_DEPARTMENT_OTHER): Payer: Medicare Other | Attending: Plastic Surgery

## 2012-05-21 DIAGNOSIS — L98499 Non-pressure chronic ulcer of skin of other sites with unspecified severity: Secondary | ICD-10-CM | POA: Insufficient documentation

## 2012-05-21 NOTE — Progress Notes (Signed)
[facility]  NAME:  Shannon Schmidt, Shannon Schmidt NO.:  [id number]  MEDICAL RECORD NO.:  [id number]      DATE OF BIRTH:  1941/03/18  PHYSICIAN:  Wayland Denis, DO            VISIT DATE:                                  OFFICE VISIT   The patient is a 71 year old female who is here for followup on her sacral ulcers. She has been using wet-to-dry dressing changes in the nursing home with some improvement.  She states that she does have an air mattress bed and she is trying to be really good about shifting off of the wound and onto her side while avoiding her colostomy bag.  It has been awhile since she has had any nutritional markers checked.  Her medications are unchanged from her last appointment.  SOCIAL HISTORY:  Unchanged.  REVIEW OF SYSTEMS:  Otherwise negative.  PHYSICAL EXAMINATION:  She is alert and oriented, cooperative, not in any acute distress.  She is very pleasant.  Her pupils are equal. Extraocular muscles are intact.  No cervical lymphadenopathy. Her abdomen is soft, nontender and her ostomy is in place.  The wounds have slight improvement from before but generally have some granulation tissue.  They do not appear to be infected.  We recommend continuing with wet-to-dry dressing change, air mattress bed, rotation, shifting off the area, sitting up as low at least amount of time as possible and checking a prealbumin to be sure she is getting the nutrition that she needs as well as increasing her protein.     Wayland Denis, DO     CS/MEDQ  D:  05/21/2012  T:  05/21/2012  Job:  [id number]

## 2012-07-02 ENCOUNTER — Encounter (HOSPITAL_BASED_OUTPATIENT_CLINIC_OR_DEPARTMENT_OTHER): Payer: Medicare Other | Attending: Plastic Surgery

## 2012-07-02 DIAGNOSIS — L98499 Non-pressure chronic ulcer of skin of other sites with unspecified severity: Secondary | ICD-10-CM | POA: Insufficient documentation

## 2012-07-03 NOTE — Progress Notes (Signed)
[facility]  NAME:  Shannon Schmidt, Shannon Schmidt NO.:  [id number]  MEDICAL RECORD NO.:  [id number]      DATE OF BIRTH:  09/16/1941  PHYSICIAN:  Wayland Denis, DO       VISIT DATE:  07/02/2012                                  OFFICE VISIT   The patient is a 71 year old female who is here for followup on her bilateral ischial ulcers.  She has been using wet-to-dry normal saline dressing changes with good improvement over the last several weeks.  She is trying to remain off of the areas more and there has been no change in medications or social history.  On physical examination, she is alert, oriented, cooperative, not in any acute distress.  She is pleasant.  Pupils are equal.  Extraocular muscles intact.  No cervical lymphadenopathy.  Breathing is unlabored. Heart is regular.  Her abdomen is soft.  The wounds are improved some with decreased in size and improvement in granulation tissue.  We will continue with normal saline wet-to-dry, increase protein, and remain off of the areas as much as possible.     Wayland Denis, DO     CS/MEDQ  D:  07/02/2012  T:  07/03/2012  Job:  [id number]

## 2012-08-13 ENCOUNTER — Encounter (HOSPITAL_BASED_OUTPATIENT_CLINIC_OR_DEPARTMENT_OTHER): Payer: Medicare Other | Attending: Plastic Surgery

## 2012-08-13 DIAGNOSIS — L98499 Non-pressure chronic ulcer of skin of other sites with unspecified severity: Secondary | ICD-10-CM | POA: Insufficient documentation

## 2012-08-14 NOTE — Progress Notes (Signed)
[facility]  NAME:  Shannon Schmidt, Shannon Schmidt NO.:  [id number]  MEDICAL RECORD NO.:  [id number]      DATE OF BIRTH:  01/21/1941  PHYSICIAN:  Wayland Denis, DO       VISIT DATE:  08/13/2012                                  OFFICE VISIT   The patient is a 71 year old female who is here for followup on her bilateral ischial ulcers.  She has been using wet-to-dry twice a day and she is on the air mattress bed.  The wounds do appear to be clean. There was a little bit of fibrous tissue like a fibrous film over the wounds, but there was no sign of infection.  They were slightly smaller than previously.  There has been no change in her medications.  SOCIAL HISTORY:  She is very pleasant.  REVIEW OF SYSTEMS:  Negative.  On exam, she is alert and oriented.  Her extraocular muscles are intact. There is no cervical lymphadenopathy.  Her breathing is unlabored and her heart rate is regular.  Abdomen is soft and nontender.  The wounds are as described above.  We will continue with wet-to-dry dressings and hopefully in the next few weeks, will be able to start collagen dressings.  We will see her back in followup.     Wayland Denis, DO     CS/MEDQ  D:  08/13/2012  T:  08/14/2012  Job:  [id number]

## 2012-09-17 ENCOUNTER — Encounter (HOSPITAL_BASED_OUTPATIENT_CLINIC_OR_DEPARTMENT_OTHER): Payer: Medicare Other | Attending: Plastic Surgery

## 2012-09-17 DIAGNOSIS — L8994 Pressure ulcer of unspecified site, stage 4: Secondary | ICD-10-CM | POA: Insufficient documentation

## 2012-09-17 DIAGNOSIS — L89309 Pressure ulcer of unspecified buttock, unspecified stage: Secondary | ICD-10-CM | POA: Insufficient documentation

## 2012-10-22 ENCOUNTER — Encounter (HOSPITAL_BASED_OUTPATIENT_CLINIC_OR_DEPARTMENT_OTHER): Payer: Medicare Other | Attending: Plastic Surgery

## 2012-10-22 DIAGNOSIS — L8994 Pressure ulcer of unspecified site, stage 4: Secondary | ICD-10-CM | POA: Insufficient documentation

## 2012-10-22 DIAGNOSIS — L89309 Pressure ulcer of unspecified buttock, unspecified stage: Secondary | ICD-10-CM | POA: Insufficient documentation

## 2012-11-19 ENCOUNTER — Encounter (HOSPITAL_BASED_OUTPATIENT_CLINIC_OR_DEPARTMENT_OTHER): Payer: Medicare Other

## 2013-01-21 ENCOUNTER — Encounter (HOSPITAL_BASED_OUTPATIENT_CLINIC_OR_DEPARTMENT_OTHER): Payer: Medicare Other | Attending: General Surgery

## 2013-01-21 DIAGNOSIS — L8994 Pressure ulcer of unspecified site, stage 4: Secondary | ICD-10-CM | POA: Insufficient documentation

## 2013-01-21 DIAGNOSIS — L89309 Pressure ulcer of unspecified buttock, unspecified stage: Secondary | ICD-10-CM | POA: Insufficient documentation

## 2014-01-13 ENCOUNTER — Encounter (HOSPITAL_BASED_OUTPATIENT_CLINIC_OR_DEPARTMENT_OTHER): Payer: Medicare Other | Attending: General Surgery

## 2014-01-13 DIAGNOSIS — L89309 Pressure ulcer of unspecified buttock, unspecified stage: Secondary | ICD-10-CM | POA: Insufficient documentation

## 2014-01-13 DIAGNOSIS — L8993 Pressure ulcer of unspecified site, stage 3: Secondary | ICD-10-CM | POA: Insufficient documentation

## 2014-01-27 ENCOUNTER — Encounter (HOSPITAL_BASED_OUTPATIENT_CLINIC_OR_DEPARTMENT_OTHER): Payer: Medicare Other | Attending: General Surgery

## 2014-01-27 DIAGNOSIS — L89309 Pressure ulcer of unspecified buttock, unspecified stage: Secondary | ICD-10-CM | POA: Insufficient documentation

## 2014-01-27 DIAGNOSIS — L8994 Pressure ulcer of unspecified site, stage 4: Secondary | ICD-10-CM | POA: Insufficient documentation

## 2014-01-27 DIAGNOSIS — L97509 Non-pressure chronic ulcer of other part of unspecified foot with unspecified severity: Secondary | ICD-10-CM | POA: Insufficient documentation

## 2014-01-28 NOTE — Progress Notes (Signed)
[facility]  NAME:  SION, THANE              ACCOUNT NO.:  [id number]  MEDICAL RECORD NO.:  [id number]      DATE OF BIRTH:  13-Nov-1941  PHYSICIAN:  Irene Limbo, MD    VISIT DATE:  01/27/2014                                  OFFICE VISIT   CHIEF COMPLAINT:  Bilateral ischial pressure ulcerations and right foot pressure ulceration.  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  She reports she has undergone surgical debridement of these with no previous flap surgery.  She does have a diverting ostomy.  Current wound care as recommended by the wound center was silver alginate.  However, patient presented today with what appears to be Dakin's solution over her ischial ulcers.  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 She has evidence of neoepithelialization present.  No debridement was performed.  There was scant drainage present.  We will plan to switch to collagen dressing with foam 3 times weekly.  Examination of her second toe ulceration reveals that this goes directly into the joint space.  This finding was reviewed with the patient and the concern for need for amputation of the toe if any infection develops.  Presently, we will plan to switch this to collagen dressing as well and she will follow up in 2 weeks' time.          ______________________________ Irene Limbo, MD     BT/MEDQ  D:  01/27/2014  T:  01/28/2014  Job:  [id number]

## 2014-02-11 NOTE — Progress Notes (Signed)
[facility]  NAME:  Shannon Schmidt, Shannon Schmidt              ACCOUNT NO.:  [id number]  MEDICAL RECORD NO.:  [id number]      DATE OF BIRTH:  1941-09-25  PHYSICIAN:  Irene Limbo, MD    VISIT DATE:  02/10/2014                                  OFFICE VISIT   CHIEF COMPLAINT:  Bilateral ischial pressure ulcerations, chronic and right foot pressure ulceration.  The patient is a 73 year old female, who is paraplegic with longstanding history of bilateral ischial ulcerations.  Current wound care has been Prisma since her last visit here 2 weeks ago.  She was also noted to have open wound over her second toe that tracked down to her joint space.  PHYSICAL EXAMINATION:  Temperature is 98.5, pulse is 82, blood pressure is 132/76. Right second toe has adherent scab in place. Following removal of this, there appears to be a granulated wound that is measuring 0.2 x 0.2 x 0.1 cm with some hypergranulation tissue present.  This was treated with silver nitrate.  The right ischium is measured as 3 x 0.9 x 0.4 and the left ischium as measured by 5.2 x 3.1 x 1.6 cm.  This is stable since her last visit and these wounds are granulated with very minimal slough and scant drainage.  No debridement was performed over the ischial ulcers.    ASSESSMENT: 1. Chronic bilateral ischial ulcers.  We will continue with Prisma and     foam dressing. 2. Second toe ulceration that tracks to the joint space, which appears     to be improved since her last visit.  We will continue with Prisma     and dry dressing, and follow up in 2 week's time.          ______________________________ Irene Limbo, MD MBA     BT/MEDQ  D:  02/10/2014  T:  02/11/2014  Job:  [id number]

## 2014-03-03 ENCOUNTER — Encounter (HOSPITAL_BASED_OUTPATIENT_CLINIC_OR_DEPARTMENT_OTHER): Payer: Medicare Other | Attending: Plastic Surgery

## 2014-03-03 DIAGNOSIS — L89309 Pressure ulcer of unspecified buttock, unspecified stage: Secondary | ICD-10-CM | POA: Insufficient documentation

## 2014-03-03 DIAGNOSIS — L8994 Pressure ulcer of unspecified site, stage 4: Secondary | ICD-10-CM | POA: Insufficient documentation

## 2014-03-03 DIAGNOSIS — L97509 Non-pressure chronic ulcer of other part of unspecified foot with unspecified severity: Secondary | ICD-10-CM | POA: Insufficient documentation

## 2014-03-04 NOTE — Progress Notes (Signed)
[facility]  NAME:  Shannon Schmidt, Shannon Schmidt              ACCOUNT NO.:  [id number]  MEDICAL RECORD NO.:  [id number]      DATE OF BIRTH:  [birth year]  PHYSICIAN:  Irene Limbo, MD    VISIT DATE:  03/03/2014                                  OFFICE VISIT   CHIEF COMPLAINT:  Followup of bilateral ischial stage IV pressure ulcerations and a second toe ulceration.  HISTORY OF PRESENT ILLNESS:  The patient is a 73 year old female with history of paraplegia with chronic bilateral ischial ulcers.  Current wound care has been Prisma with foam dressing.  She also has a second toe ulceration that appeared approximately 2 months ago that was on initial presentation, down to her joint space.  Current wound care has been collagen as well.  The patient reports that she spends most of her time in bed or sitting in her chair.  She does have an onlay air mattress.  She reports they give her protein supplementation, but she does not taken any of it.  On examination, blood pressure is 176/88, blood glucose is 125, pulse is 76, temperature is 98.1.  Right second toe is measured as Wagner 2 at 0.8 x 1.5 x 0.1 cm.  The wound is completely granulated with some hypergranulation tissue developing.  Silver nitrate was applied for cauterization of the hypergranulation tissue.  Over her right ischium, wound size is contracted over the right ischium measured as 2.5 x 0.7 x 0.1 cm.  No debridement was performed here.  The wound is completely Granulated, stage IV with granulation tissue present over the bone. Over the left ischium wound is measured as 5.1 x 3 x 1.5 cm.  This is minimally changed since her last visit.  There is slough present over the wound.  This is again a stage IV ulceration with granulation tissue present over bone.  There is some darker areas of discoloration present with the wound consistent with pressure injury.  She does report increased sitting during the week.  The patient  is completely anesthetic in the area and curette was used to remove all of the superficial slough over the wound.  We will continue with plain collagen to all of the wounds with foam dressing to the bilateral ischium and foam or dry dressing over the second toe wound.  PLAN:  For followup in 2 week's time.          ______________________________ Irene Limbo, MD MBA     BT/MEDQ  D:  03/03/2014  T:  03/04/2014  Job:  [id number]

## 2014-03-09 ENCOUNTER — Encounter (HOSPITAL_COMMUNITY): Payer: Self-pay | Admitting: Emergency Medicine

## 2014-03-09 ENCOUNTER — Inpatient Hospital Stay (HOSPITAL_COMMUNITY)
Admission: EM | Admit: 2014-03-09 | Discharge: 2014-03-16 | DRG: 871 | Disposition: A | Discharge status: Skilled Nursing Facility | Payer: Medicare Other | Attending: Family Medicine | Admitting: Family Medicine

## 2014-03-09 ENCOUNTER — Emergency Department (HOSPITAL_COMMUNITY): Payer: Medicare Other

## 2014-03-09 DIAGNOSIS — Z933 Colostomy status: Secondary | ICD-10-CM

## 2014-03-09 DIAGNOSIS — E785 Hyperlipidemia, unspecified: Secondary | ICD-10-CM | POA: Diagnosis present

## 2014-03-09 DIAGNOSIS — T361X5A Adverse effect of cephalosporins and other beta-lactam antibiotics, initial encounter: Secondary | ICD-10-CM | POA: Diagnosis not present

## 2014-03-09 DIAGNOSIS — E46 Unspecified protein-calorie malnutrition: Secondary | ICD-10-CM | POA: Diagnosis present

## 2014-03-09 DIAGNOSIS — E119 Type 2 diabetes mellitus without complications: Secondary | ICD-10-CM | POA: Diagnosis present

## 2014-03-09 DIAGNOSIS — L89109 Pressure ulcer of unspecified part of back, unspecified stage: Secondary | ICD-10-CM | POA: Diagnosis present

## 2014-03-09 DIAGNOSIS — M069 Rheumatoid arthritis, unspecified: Secondary | ICD-10-CM | POA: Diagnosis present

## 2014-03-09 DIAGNOSIS — D649 Anemia, unspecified: Secondary | ICD-10-CM | POA: Diagnosis present

## 2014-03-09 DIAGNOSIS — Z6826 Body mass index (BMI) 26.0-26.9, adult: Secondary | ICD-10-CM

## 2014-03-09 DIAGNOSIS — Z794 Long term (current) use of insulin: Secondary | ICD-10-CM

## 2014-03-09 DIAGNOSIS — E669 Obesity, unspecified: Secondary | ICD-10-CM | POA: Diagnosis present

## 2014-03-09 DIAGNOSIS — F319 Bipolar disorder, unspecified: Secondary | ICD-10-CM | POA: Diagnosis present

## 2014-03-09 DIAGNOSIS — Z87440 Personal history of urinary (tract) infections: Secondary | ICD-10-CM

## 2014-03-09 DIAGNOSIS — R32 Unspecified urinary incontinence: Secondary | ICD-10-CM | POA: Diagnosis present

## 2014-03-09 DIAGNOSIS — Z7401 Bed confinement status: Secondary | ICD-10-CM

## 2014-03-09 DIAGNOSIS — D72829 Elevated white blood cell count, unspecified: Secondary | ICD-10-CM | POA: Diagnosis present

## 2014-03-09 DIAGNOSIS — B9620 Unspecified Escherichia coli [E. coli] as the cause of diseases classified elsewhere: Secondary | ICD-10-CM | POA: Diagnosis present

## 2014-03-09 DIAGNOSIS — K449 Diaphragmatic hernia without obstruction or gangrene: Secondary | ICD-10-CM | POA: Diagnosis present

## 2014-03-09 DIAGNOSIS — R7881 Bacteremia: Secondary | ICD-10-CM

## 2014-03-09 DIAGNOSIS — Z66 Do not resuscitate: Secondary | ICD-10-CM | POA: Diagnosis present

## 2014-03-09 DIAGNOSIS — A419 Sepsis, unspecified organism: Secondary | ICD-10-CM | POA: Diagnosis present

## 2014-03-09 DIAGNOSIS — K567 Ileus, unspecified: Secondary | ICD-10-CM | POA: Diagnosis present

## 2014-03-09 DIAGNOSIS — L8993 Pressure ulcer of unspecified site, stage 3: Secondary | ICD-10-CM | POA: Diagnosis present

## 2014-03-09 DIAGNOSIS — E1169 Type 2 diabetes mellitus with other specified complication: Secondary | ICD-10-CM | POA: Diagnosis present

## 2014-03-09 DIAGNOSIS — K219 Gastro-esophageal reflux disease without esophagitis: Secondary | ICD-10-CM | POA: Diagnosis present

## 2014-03-09 DIAGNOSIS — A4151 Sepsis due to Escherichia coli [E. coli]: Principal | ICD-10-CM | POA: Diagnosis present

## 2014-03-09 DIAGNOSIS — M9080 Osteopathy in diseases classified elsewhere, unspecified site: Secondary | ICD-10-CM | POA: Diagnosis present

## 2014-03-09 DIAGNOSIS — Z9849 Cataract extraction status, unspecified eye: Secondary | ICD-10-CM

## 2014-03-09 DIAGNOSIS — J4489 Other specified chronic obstructive pulmonary disease: Secondary | ICD-10-CM | POA: Diagnosis present

## 2014-03-09 DIAGNOSIS — N390 Urinary tract infection, site not specified: Secondary | ICD-10-CM

## 2014-03-09 DIAGNOSIS — Z823 Family history of stroke: Secondary | ICD-10-CM

## 2014-03-09 DIAGNOSIS — Z87891 Personal history of nicotine dependence: Secondary | ICD-10-CM

## 2014-03-09 DIAGNOSIS — M86659 Other chronic osteomyelitis, unspecified thigh: Secondary | ICD-10-CM | POA: Diagnosis present

## 2014-03-09 DIAGNOSIS — L270 Generalized skin eruption due to drugs and medicaments taken internally: Secondary | ICD-10-CM | POA: Diagnosis not present

## 2014-03-09 DIAGNOSIS — L89309 Pressure ulcer of unspecified buttock, unspecified stage: Secondary | ICD-10-CM | POA: Diagnosis present

## 2014-03-09 DIAGNOSIS — R5381 Other malaise: Secondary | ICD-10-CM | POA: Diagnosis present

## 2014-03-09 DIAGNOSIS — I10 Essential (primary) hypertension: Secondary | ICD-10-CM

## 2014-03-09 DIAGNOSIS — J449 Chronic obstructive pulmonary disease, unspecified: Secondary | ICD-10-CM | POA: Diagnosis present

## 2014-03-09 DIAGNOSIS — L8994 Pressure ulcer of unspecified site, stage 4: Secondary | ICD-10-CM | POA: Diagnosis present

## 2014-03-09 DIAGNOSIS — R6510 Systemic inflammatory response syndrome (SIRS) of non-infectious origin without acute organ dysfunction: Secondary | ICD-10-CM | POA: Diagnosis present

## 2014-03-09 DIAGNOSIS — L8990 Pressure ulcer of unspecified site, unspecified stage: Secondary | ICD-10-CM

## 2014-03-09 DIAGNOSIS — Z833 Family history of diabetes mellitus: Secondary | ICD-10-CM

## 2014-03-09 DIAGNOSIS — Z8249 Family history of ischemic heart disease and other diseases of the circulatory system: Secondary | ICD-10-CM

## 2014-03-09 DIAGNOSIS — K560 Paralytic ileus: Secondary | ICD-10-CM | POA: Diagnosis present

## 2014-03-09 DIAGNOSIS — E871 Hypo-osmolality and hyponatremia: Secondary | ICD-10-CM | POA: Diagnosis present

## 2014-03-09 DIAGNOSIS — N12 Tubulo-interstitial nephritis, not specified as acute or chronic: Secondary | ICD-10-CM | POA: Diagnosis present

## 2014-03-09 HISTORY — DX: Polyneuropathy, unspecified: G62.9

## 2014-03-09 HISTORY — DX: Paraplegia, unspecified: G82.20

## 2014-03-09 LAB — CBC WITH DIFFERENTIAL/PLATELET
Basophils Absolute: 0 10*3/uL (ref 0.0–0.1)
Basophils Relative: 0 % (ref 0–1)
EOS ABS: 0 10*3/uL (ref 0.0–0.7)
EOS PCT: 0 % (ref 0–5)
HCT: 34 % — ABNORMAL LOW (ref 36.0–46.0)
HEMOGLOBIN: 11.1 g/dL — AB (ref 12.0–15.0)
LYMPHS ABS: 0.8 10*3/uL (ref 0.7–4.0)
Lymphocytes Relative: 7 % — ABNORMAL LOW (ref 12–46)
MCH: 28.6 pg (ref 26.0–34.0)
MCHC: 32.6 g/dL (ref 30.0–36.0)
MCV: 87.6 fL (ref 78.0–100.0)
MONO ABS: 1 10*3/uL (ref 0.1–1.0)
MONOS PCT: 9 % (ref 3–12)
Neutro Abs: 9.7 10*3/uL — ABNORMAL HIGH (ref 1.7–7.7)
Neutrophils Relative %: 85 % — ABNORMAL HIGH (ref 43–77)
PLATELETS: 225 10*3/uL (ref 150–400)
RBC: 3.88 MIL/uL (ref 3.87–5.11)
RDW: 15.6 % — ABNORMAL HIGH (ref 11.5–15.5)
WBC: 11.5 10*3/uL — ABNORMAL HIGH (ref 4.0–10.5)

## 2014-03-09 LAB — URINALYSIS, ROUTINE W REFLEX MICROSCOPIC
BILIRUBIN URINE: NEGATIVE
Glucose, UA: NEGATIVE mg/dL
Ketones, ur: NEGATIVE mg/dL
NITRITE: POSITIVE — AB
PH: 6 (ref 5.0–8.0)
Protein, ur: NEGATIVE mg/dL
SPECIFIC GRAVITY, URINE: 1.008 (ref 1.005–1.030)
Urobilinogen, UA: 0.2 mg/dL (ref 0.0–1.0)

## 2014-03-09 LAB — COMPREHENSIVE METABOLIC PANEL
ALT: 7 U/L (ref 0–35)
AST: 12 U/L (ref 0–37)
Albumin: 2.9 g/dL — ABNORMAL LOW (ref 3.5–5.2)
Alkaline Phosphatase: 54 U/L (ref 39–117)
BUN: 17 mg/dL (ref 6–23)
CALCIUM: 9.2 mg/dL (ref 8.4–10.5)
CO2: 25 mEq/L (ref 19–32)
CREATININE: 0.65 mg/dL (ref 0.50–1.10)
Chloride: 95 mEq/L — ABNORMAL LOW (ref 96–112)
GFR calc non Af Amer: 87 mL/min — ABNORMAL LOW (ref >90)
GLUCOSE: 194 mg/dL — AB (ref 70–99)
Potassium: 3.8 mEq/L (ref 3.7–5.3)
Sodium: 133 mEq/L — ABNORMAL LOW (ref 137–147)
TOTAL PROTEIN: 8.2 g/dL (ref 6.0–8.3)
Total Bilirubin: 0.4 mg/dL (ref 0.3–1.2)

## 2014-03-09 LAB — URINE MICROSCOPIC-ADD ON

## 2014-03-09 LAB — CBG MONITORING, ED
GLUCOSE-CAPILLARY: 173 mg/dL — AB (ref 70–99)
Glucose-Capillary: 179 mg/dL — ABNORMAL HIGH (ref 70–99)

## 2014-03-09 LAB — I-STAT CG4 LACTIC ACID, ED: LACTIC ACID, VENOUS: 1.24 mmol/L (ref 0.5–2.2)

## 2014-03-09 MED ORDER — SIMVASTATIN 10 MG PO TABS
10.0000 mg | ORAL_TABLET | Freq: Every day | ORAL | Status: DC
  Administered 2014-03-09 – 2014-03-15 (×7): 10 mg via ORAL
  Filled 2014-03-09 (×8): qty 1

## 2014-03-09 MED ORDER — ONDANSETRON HCL 4 MG/2ML IJ SOLN: 4.0000 mg | Freq: Four times a day (QID) | INTRAMUSCULAR | Status: DC | PRN

## 2014-03-09 MED ORDER — INSULIN ASPART 100 UNIT/ML ~~LOC~~ SOLN
0.0000 [IU] | SUBCUTANEOUS | Status: DC
  Administered 2014-03-09: 2 [IU] via SUBCUTANEOUS
  Administered 2014-03-11: 5 [IU] via SUBCUTANEOUS
  Administered 2014-03-11: 2 [IU] via SUBCUTANEOUS
  Administered 2014-03-11: 1 [IU] via SUBCUTANEOUS

## 2014-03-09 MED ORDER — SODIUM CHLORIDE 0.9 % IJ SOLN
3.0000 mL | Freq: Two times a day (BID) | INTRAMUSCULAR | Status: DC
  Administered 2014-03-09 – 2014-03-14 (×7): 3 mL via INTRAVENOUS

## 2014-03-09 MED ORDER — TIZANIDINE HCL 2 MG PO TABS
2.0000 mg | ORAL_TABLET | Freq: Two times a day (BID) | ORAL | Status: DC
  Administered 2014-03-09 – 2014-03-16 (×14): 2 mg via ORAL
  Filled 2014-03-09 (×15): qty 1

## 2014-03-09 MED ORDER — VANCOMYCIN HCL IN DEXTROSE 1-5 GM/200ML-% IV SOLN
1000.0000 mg | Freq: Once | INTRAVENOUS | Status: AC
  Administered 2014-03-09: 1000 mg via INTRAVENOUS
  Filled 2014-03-09: qty 200

## 2014-03-09 MED ORDER — PANTOPRAZOLE SODIUM 40 MG PO TBEC
40.0000 mg | DELAYED_RELEASE_TABLET | Freq: Every day | ORAL | Status: DC
  Administered 2014-03-10 – 2014-03-16 (×7): 40 mg via ORAL
  Filled 2014-03-09 (×7): qty 1

## 2014-03-09 MED ORDER — SODIUM CHLORIDE 0.9 % IV BOLUS (SEPSIS)
1000.0000 mL | Freq: Once | INTRAVENOUS | Status: AC
  Administered 2014-03-09: 1000 mL via INTRAVENOUS

## 2014-03-09 MED ORDER — RISPERIDONE 1 MG PO TABS
1.5000 mg | ORAL_TABLET | Freq: Two times a day (BID) | ORAL | Status: DC
  Administered 2014-03-09 – 2014-03-16 (×14): 1.5 mg via ORAL
  Filled 2014-03-09 (×15): qty 1

## 2014-03-09 MED ORDER — ENOXAPARIN SODIUM 40 MG/0.4ML ~~LOC~~ SOLN
40.0000 mg | Freq: Every day | SUBCUTANEOUS | Status: DC
  Administered 2014-03-09 – 2014-03-15 (×7): 40 mg via SUBCUTANEOUS
  Filled 2014-03-09 (×8): qty 0.4

## 2014-03-09 MED ORDER — ONDANSETRON HCL 4 MG PO TABS: 4.0000 mg | ORAL_TABLET | Freq: Four times a day (QID) | ORAL | Status: DC | PRN

## 2014-03-09 MED ORDER — INSULIN DETEMIR 100 UNIT/ML ~~LOC~~ SOLN
50.0000 [IU] | Freq: Every day | SUBCUTANEOUS | Status: DC
  Administered 2014-03-09 – 2014-03-10 (×2): 50 [IU] via SUBCUTANEOUS
  Filled 2014-03-09 (×3): qty 0.5

## 2014-03-09 MED ORDER — OXYCODONE HCL 5 MG PO TABS
15.0000 mg | ORAL_TABLET | Freq: Four times a day (QID) | ORAL | Status: DC | PRN
  Administered 2014-03-09 – 2014-03-16 (×21): 15 mg via ORAL
  Filled 2014-03-09 (×21): qty 3

## 2014-03-09 MED ORDER — LATANOPROST 0.005 % OP SOLN
1.0000 [drp] | Freq: Every day | OPHTHALMIC | Status: DC
  Administered 2014-03-09 – 2014-03-15 (×7): 1 [drp] via OPHTHALMIC
  Filled 2014-03-09: qty 2.5

## 2014-03-09 MED ORDER — SIMETHICONE 80 MG PO CHEW
80.0000 mg | CHEWABLE_TABLET | Freq: Two times a day (BID) | ORAL | Status: DC | PRN
  Filled 2014-03-09: qty 1

## 2014-03-09 MED ORDER — ACETAMINOPHEN 325 MG PO TABS
650.0000 mg | ORAL_TABLET | Freq: Four times a day (QID) | ORAL | Status: DC | PRN
  Administered 2014-03-09 – 2014-03-11 (×4): 650 mg via ORAL
  Filled 2014-03-09 (×4): qty 2

## 2014-03-09 MED ORDER — SODIUM CHLORIDE 0.9 % IV SOLN
INTRAVENOUS | Status: AC
  Administered 2014-03-09: 23:00:00 via INTRAVENOUS

## 2014-03-09 MED ORDER — DEXTROSE 5 % IV SOLN
1.0000 g | Freq: Once | INTRAVENOUS | Status: AC
  Administered 2014-03-09: 1 g via INTRAVENOUS
  Filled 2014-03-09: qty 10

## 2014-03-09 MED ORDER — LORATADINE 10 MG PO TABS
10.0000 mg | ORAL_TABLET | Freq: Every morning | ORAL | Status: DC
  Administered 2014-03-10 – 2014-03-16 (×7): 10 mg via ORAL
  Filled 2014-03-09 (×7): qty 1

## 2014-03-09 MED ORDER — DEXTROSE 5 % IV SOLN: 1.0000 g | INTRAVENOUS | Status: DC

## 2014-03-09 MED ORDER — ACETAMINOPHEN 650 MG RE SUPP: 650.0000 mg | Freq: Four times a day (QID) | RECTAL | Status: DC | PRN

## 2014-03-09 MED ORDER — DEXTROSE 5 % IV SOLN
1.0000 g | INTRAVENOUS | Status: DC
  Filled 2014-03-09: qty 10

## 2014-03-09 NOTE — Progress Notes (Addendum)
ANTIBIOTIC CONSULT NOTE - INITIAL  Pharmacy Consult for Vancomycin Indication: Wound infection  Allergies  Allergen Reactions  . Ace Inhibitors Other (See Comments)    Unknown   . Chlorpromazine Other (See Comments)    Unknown  . Haloperidol Decanoate Other (See Comments)    Unknown    Patient Measurements: Current height/weight - not available at this time  Vital Signs: Temp: 100.7 F (38.2 C) (04/12 1838) Temp src: Oral (04/12 1838) BP: 140/104 mmHg (04/12 2100) Pulse Rate: 98 (04/12 2100) Intake/Output from previous day:   Intake/Output from this shift:    Labs:  Recent Labs  03/09/14 1847  WBC 11.5*  HGB 11.1*  PLT 225  CREATININE 0.65   The CrCl is unknown because both a height and weight (above a minimum accepted value) are required for this calculation. No results found for this basename: VANCOTROUGH, VANCOPEAK, VANCORANDOM, GENTTROUGH, GENTPEAK, GENTRANDOM, TOBRATROUGH, TOBRAPEAK, TOBRARND, AMIKACINPEAK, AMIKACINTROU, AMIKACIN,  in the last 72 hours   Microbiology: No results found for this or any previous visit (from the past 720 hour(s)).  Medical History: Past Medical History  Diagnosis Date  . Osteomyelitis   . Schizophrenia   . Bipolar 1 disorder   . Muscle weakness   . Diabetes mellitus   . GERD (gastroesophageal reflux disease)   . PONV (postoperative nausea and vomiting)   . RA (rheumatoid arthritis)   . Colostomy in place   . COPD (chronic obstructive pulmonary disease)   . H/O hiatal hernia   . Depression   . Anemia     Medications:  Scheduled:  . [START ON 03/10/2014] cefTRIAXone (ROCEPHIN)  IV  1 g Intravenous Q24H  . enoxaparin (LOVENOX) injection  40 mg Subcutaneous QHS  . [START ON 03/10/2014] insulin aspart  0-9 Units Subcutaneous 6 times per day  . insulin detemir  50 Units Subcutaneous QHS  . latanoprost  1 drop Both Eyes QHS  . [START ON 03/10/2014] loratadine  10 mg Oral q morning - 10a  . [START ON 03/10/2014]  pantoprazole  40 mg Oral Daily  . risperiDONE  1.5 mg Oral BID  . simvastatin  10 mg Oral QHS  . sodium chloride  3 mL Intravenous Q12H  . tiZANidine  2 mg Oral BID  . vancomycin  1,000 mg Intravenous Once   Infusions:  . sodium chloride     Assessment:  73 yr female with h/o osteomyelitis and decubitus ulcers.  Patient with complaint of fever x 3 days and foul smelling urine.  Rocephin 1gm daily started for UTI  KUB shows + ileus  Pharmacy asked to dose Vancomycin for wound infection  Height/weight currently not documented in EPIC (last weight from June 2013) so will order initial Vanc dose now then f/u with regimen once known  Goal of Therapy:  Vancomycin trough level 15-20 mcg/ml  Plan:  Measure antibiotic drug levels at steady state Follow up culture results Vancomycin 1gm IV x 1 now F/U height/weight once documented in EPIC and will order Vanc regimen at that time.  Everette Rank, PharmD 03/09/2014,11:28 PM  ADDENDUM:  Height/weight recorded Height = 70 inches Weight = 85.2 kg CrCl ~ 76 ml/min  PLAN:  Vancomycin 1gm IV q12h  Leone Haven, PharmD 03/10/14 @ 00:16

## 2014-03-09 NOTE — H&P (Addendum)
PCP:  Theressa Millard, MD Dr. Reesa Chew   Chief Complaint:  Fever, foul smelling urine  HPI: Shannon Schmidt is a 73 y.o. female   has a past medical history of Osteomyelitis; Schizophrenia; Bipolar 1 disorder; Muscle weakness; Diabetes mellitus; GERD (gastroesophageal reflux disease); PONV (postoperative nausea and vomiting); RA (rheumatoid arthritis); Colostomy in place; COPD (chronic obstructive pulmonary disease); H/O hiatal hernia; Depression; and Anemia.   Presented with  Patient resides at [facility], she has hx of diabetes and decubitus ulcers sp diverting colostomy. She is bed bound due to hx of RA and neuropathy. She has had fever for the past 3 days and foul smelling urine. Patient used to have indwelling catheter but that was discontinued due to recurrent UTI. Patient had fever up to 103 today and was sent to ER. She denies any cough, no diarrhea but rather decreased stool output from colostomy. She have had some abdominal distention.  In ER she was found to have UTI and treated with ceftriaxone. KUB was done showing ileus. Hospitalist was called for admission Review of Systems:     Pertinent positives include: Fevers, chills, decreased stool output.   Constitutional:  No weight loss, night sweats,  fatigue, weight loss  HEENT:  No headaches, Difficulty swallowing,Tooth/dental problems,Sore throat,  No sneezing, itching, ear ache, nasal congestion, post nasal drip,  Cardio-vascular:  No chest pain, Orthopnea, PND, anasarca, dizziness, palpitations.no Bilateral lower extremity swelling  GI:  No heartburn, indigestion, abdominal pain, nausea, vomiting, diarrhea, change in bowel habits, loss of appetite, melena, blood in stool, hematemesis Resp:  no shortness of breath at rest. No dyspnea on exertion, No excess mucus, no productive cough, No non-productive cough, No coughing up of blood.No change in color of mucus.No wheezing. Skin:  no rash or lesions. No  jaundice GU:  no dysuria, change in color of urine, no urgency or frequency. No straining to urinate.  No flank pain.  Musculoskeletal:  No joint pain or no joint swelling. No decreased range of motion. No back pain.  Psych:  No change in mood or affect. No depression or anxiety. No memory loss.  Neuro: no localizing neurological complaints, no tingling, no weakness, no double vision, no gait abnormality, no slurred speech, no confusion  Otherwise ROS are negative except for above, 10 systems were reviewed  Past Medical History: Past Medical History  Diagnosis Date  . Osteomyelitis   . Schizophrenia   . Bipolar 1 disorder   . Muscle weakness   . Diabetes mellitus   . GERD (gastroesophageal reflux disease)   . PONV (postoperative nausea and vomiting)   . RA (rheumatoid arthritis)   . Colostomy in place   . COPD (chronic obstructive pulmonary disease)   . H/O hiatal hernia   . Depression   . Anemia    Past Surgical History  Procedure Laterality Date  . Knee surgery    . Abdominal hysterectomy    . Joint replacement      knee  . Wound debridement    . Eye surgery      L cataract  . Ganglion cyst excision      R wrist x2  . Breast surgery      L breast abscess  . Hernia repair      abdominal  . Colon surgery    . Cataract extraction w/phaco  05/09/2012    Procedure: CATARACT EXTRACTION PHACO AND INTRAOCULAR LENS PLACEMENT (IOC);  Surgeon: Marylynn Pearson, MD;  Location: [place];  Service:  Ophthalmology;  Laterality: Right;     Medications: Prior to Admission medications   Medication Sig Start Date End Date Taking? Authorizing Provider  acetaminophen (MAPAP) 325 MG tablet Take by mouth 2 (two) times daily. For pain or fever.   Yes Historical Provider, MD  acetaminophen (TYLENOL) 500 MG tablet Take 500 mg by mouth every 4 (four) hours as needed. For pain/discomfort.    Historical Provider, MD  bimatoprost (LUMIGAN) 0.01 % SOLN Place 1 drop into both eyes at bedtime. Both  eyes.    Historical Provider, MD  diphenhydrAMINE (BENADRYL) 25 mg capsule Take 25 mg by mouth every 4 (four) hours as needed. For itching and/or swelling.    Historical Provider, MD  insulin detemir (LEVEMIR) 100 UNIT/ML injection Inject 55 Units into the skin at bedtime.    Historical Provider, MD  loratadine (CLARITIN) 10 MG tablet Take 10 mg by mouth every morning.     Historical Provider, MD  omeprazole (PRILOSEC) 20 MG capsule Take 20 mg by mouth daily as needed. For heartburn.    Historical Provider, MD  simethicone (MI-ACID GAS RELIEF) 80 MG chewable tablet Chew 80 mg by mouth every 12 (twelve) hours as needed. For gas.    Historical Provider, MD  simvastatin (ZOCOR) 10 MG tablet Take 10 mg by mouth at bedtime.    Historical Provider, MD  vitamin C (ASCORBIC ACID) 500 MG tablet Take 500 mg by mouth 2 (two) times daily.    Historical Provider, MD    Allergies:   Allergies  Allergen Reactions  . Ace Inhibitors Other (See Comments)    Unknown   . Chlorpromazine Other (See Comments)    Unknown  . Haloperidol Decanoate Other (See Comments)    Unknown    Social History:  Bed ridden Lives at [facility]   reports that she has quit smoking. She does not have any smokeless tobacco history on file. She reports that she does not drink alcohol or use illicit drugs.   Family History: family history includes Cancer in her other; Diabetes type II in her mother; Hypertension in her mother; Stroke in her father.    Physical Exam: Patient Vitals for the past 24 hrs:  BP Temp Temp src Pulse Resp SpO2  03/09/14 2000 132/77 mmHg - - - - -  03/09/14 1930 152/75 mmHg - - 104 - 96 %  03/09/14 1838 - 100.7 F (38.2 C) Oral 103 18 99 %  03/09/14 1833 - - - - - 91 %    1. General:  in No Acute distress 2. Psychological: Alert and   Oriented 3. Head/ENT:   Moist  Mucous Membranes                          Head Non traumatic, neck supple                          Normal   Dentition 4. SKIN: normal  Skin turgor,  Skin clean Dry deep decubitus ulcer over left buttock measuring about 7 cm unstageable as foul discharge right buttock ulcer about 4 cm wide also deep with some discharge as well,  a small quarter size ulcer which appears to be healing well. Right second toe appears to have by 1 cm x 5 mm ulceration 5. Heart: Regular rate and rhythm no Murmur, Rub or gallop 6. Lungs: Clear to auscultation bilaterally, no wheezes or crackles   7.  Abdomen: Soft, non-tender,   distended colostomy present small amount of stool large amount of gas 8. Lower extremities: no clubbing, cyanosis, or edema 9. Neurologically Grossly intact, moving all 4 extremities equally 10. MSK: Normal range of motion  body mass index is unknown because there is no weight on file.   Labs on Admission:   Recent Labs  03/09/14 1847  NA 133*  K 3.8  CL 95*  CO2 25  GLUCOSE 194*  BUN 17  CREATININE 0.65  CALCIUM 9.2    Recent Labs  03/09/14 1847  AST 12  ALT 7  ALKPHOS 54  BILITOT 0.4  PROT 8.2  ALBUMIN 2.9*   No results found for this basename: LIPASE, AMYLASE,  in the last 72 hours  Recent Labs  03/09/14 1847  WBC 11.5*  NEUTROABS 9.7*  HGB 11.1*  HCT 34.0*  MCV 87.6  PLT 225   No results found for this basename: CKTOTAL, CKMB, CKMBINDEX, TROPONINI,  in the last 72 hours No results found for this basename: TSH, T4TOTAL, FREET3, T3FREE, THYROIDAB,  in the last 72 hours No results found for this basename: VITAMINB12, FOLATE, FERRITIN, TIBC, IRON, RETICCTPCT,  in the last 72 hours Lab Results  Component Value Date   HGBA1C  Value: 9.4 (NOTE)                                                                       According to the ADA Clinical Practice Recommendations for 2011, when HbA1c is used as a screening test:   >=6.5%   Diagnostic of Diabetes Mellitus           (if abnormal result  is confirmed)  5.7-6.4%   Increased risk of developing Diabetes Mellitus   References:Diagnosis and Classification of Diabetes Mellitus,Diabetes FIEP,3295,18(ACZYS 1):S62-S69 and Standards of Medical Care in         Diabetes - 2011,Diabetes Care,2011,34  (Suppl 1):S11-S61.* 05/04/2010    The CrCl is unknown because both a height and weight (above a minimum accepted value) are required for this calculation. ABG    Component Value Date/Time   TCO2 28 09/20/2010 1255     No results found for this basename: DDIMER     Other results:    UA evidence of UTI    Cultures:    Component Value Date/Time   SDES URINE, CATHETERIZED 10/21/2010 0004   SPECREQUEST IMMUNE:NORM UT SYMPT:NEG 10/21/2010 0004   CULT KLEBSIELLA PNEUMONIAE 10/21/2010 0004   REPTSTATUS 10/23/2010 FINAL 10/21/2010 0004       Radiological Exams on Admission: Dg Chest 1 View  03/09/2014   CLINICAL DATA:  Fever, abdominal pain, history of abdominal surgery  EXAM: CHEST - 1 VIEW  COMPARISON:  DG PNEUMONIA CHEST 1V dated 10/20/2010  FINDINGS: Heart size and vascular pattern are normal. Lungs are clear except for mild scarring or discoid atelectasis at the left lung base, similar to prior study. Given the long-term similarity, scarring is favored.  IMPRESSION: No acute findings.  Mild scarring left lung base.   Electronically Signed   By: Skipper Cliche M.D.   On: 03/09/2014 19:31   Dg Abd 1 View  03/09/2014   CLINICAL DATA:  Abdominal distention.  EXAM: ABDOMEN - 1 VIEW  COMPARISON:  CT ABD/PELVIS W CM dated 12/19/2009; DG ABD ACUTE W/CHEST dated 12/19/2009  FINDINGS: There is some gaseous prominence of both the colon and small bowel, likely reflecting ileus. No overt signs of free air are identified. Marked degenerative changes are present of the spine and hips.  IMPRESSION: Mild prominence of colon and small bowel likely reflecting ileus.   Electronically Signed   By: Aletta Edouard M.D.   On: 03/09/2014 19:29    Chart has been reviewed  Assessment/Plan  73 year old female with history of debility  decubitus ulcers status post diverting colostomy presents with urinary tract infection likely wound infection as well as ileus  Present on Admission:  . UTI (lower urinary tract infection) - covered with ceftriaxone await results of urine culture  . Ileus - will write for bowel rest provider minimize narcotic use  . SIRS (systemic inflammatory response syndrome) -  await results of blood culture administer gentle IV hydration and IV antibiotics. Watch for signs of sepsis monitor on telemetry  . Diabetes mellitus - decrease Levemir to 50 units each bedtime while patient is n.p.o. and check blood sugars frequently  . Decubitus ulcers - appears to be superficially infected will cover with vancomycin for now and await results of wound culture obtain plain films to evaluate for osteomyelitis will write wound consult. Likely protein malnutrition low albumin we'll obtain prealbumin once patient able to tolerate diet will have dietitian consult   Prophylaxis:   Lovenox, Protonix  CODE STATUS: DNR/DNI as per patient's wishes  Other plan as per orders.  I have spent a total of 55 min on this admission  Shannon Schmidt 03/09/2014, 9:12 PM

## 2014-03-09 NOTE — ED Notes (Signed)
Bed: GG83 Expected date: 03/09/14 Expected time: 6:23 PM Means of arrival: Ambulance Comments: Fever 103.0, sat 91%

## 2014-03-09 NOTE — ED Provider Notes (Signed)
CSN: [id number]     Arrival date & time 03/09/14  1830 History   First MD Initiated Contact with Patient 03/09/14 1841     Chief Complaint  Patient presents with  . Fever     (Consider location/radiation/quality/duration/timing/severity/associated sxs/prior Treatment) The history is provided by the patient.  Shannon Schmidt is a 73 y.o. female hx of sacral decub ulcers s/p colostomy, diabetes here with fever. Fever since yesterday. 80 F yesterday and 101 F today at the nursing home. She has been having hard bowel movements, but no vomiting. She denies chest pain or shortness of breath or cough. The facility noted fever 103 F today, given tylenol. She is also noted to have foul smelling urine and she wears a diaper at baseline.   Past Medical History  Diagnosis Date  . Osteomyelitis   . Schizophrenia   . Bipolar 1 disorder   . Muscle weakness   . Diabetes mellitus   . GERD (gastroesophageal reflux disease)   . PONV (postoperative nausea and vomiting)   . RA (rheumatoid arthritis)   . Colostomy in place   . COPD (chronic obstructive pulmonary disease)   . H/O hiatal hernia   . Depression   . Anemia    Past Surgical History  Procedure Laterality Date  . Knee surgery    . Abdominal hysterectomy    . Joint replacement      knee  . Wound debridement    . Eye surgery      L cataract  . Ganglion cyst excision      R wrist x2  . Breast surgery      L breast abscess  . Hernia repair      abdominal  . Colon surgery    . Cataract extraction w/phaco  05/09/2012    Procedure: CATARACT EXTRACTION PHACO AND INTRAOCULAR LENS PLACEMENT (IOC);  Surgeon: Marylynn Pearson, MD;  Location: [place];  Service: Ophthalmology;  Laterality: Right;   Family History  Problem Relation Age of Onset  . Family history unknown: Yes   History  Substance Use Topics  . Smoking status: Former Research scientist (life sciences)  . Smokeless tobacco: Not on file  . Alcohol Use: No   OB History   Grav Para Term Preterm Abortions  TAB SAB Ect Mult Living                 Review of Systems  Constitutional: Positive for fever.  All other systems reviewed and are negative.     Allergies  Ace inhibitors; Chlorpromazine; and Haloperidol decanoate  Home Medications   Current Outpatient Rx  Name  Route  Sig  Dispense  Refill  . acetaminophen (MAPAP) 325 MG tablet   Oral   Take by mouth 2 (two) times daily. For pain or fever.         Marland Kitchen acetaminophen (TYLENOL) 500 MG tablet   Oral   Take 500 mg by mouth every 4 (four) hours as needed. For pain/discomfort.         . bimatoprost (LUMIGAN) 0.01 % SOLN   Both Eyes   Place 1 drop into both eyes at bedtime. Both eyes.         . collagenase (SANTYL) ointment   Topical   Apply 1 application topically 2 (two) times daily.         . Dakins (SODIUM HYPOCHLORITE) external solution   Topical   Apply 1 application topically daily. For decubitus right and left ischium         .  diphenhydrAMINE (BENADRYL) 25 mg capsule   Oral   Take 25 mg by mouth every 4 (four) hours as needed. For itching and/or swelling.         . insulin aspart (NOVOLOG) 100 UNIT/ML injection   Subcutaneous   Inject 2-10 Units into the skin 3 (three) times daily before meals. Per sliding scale.         . insulin detemir (LEVEMIR) 100 UNIT/ML injection   Subcutaneous   Inject 55 Units into the skin at bedtime.         Marland Kitchen loratadine (CLARITIN) 10 MG tablet   Oral   Take 10 mg by mouth every morning.          . methocarbamol (ROBAXIN) 500 MG tablet   Oral   Take 500 mg by mouth 2 (two) times daily.          . Multiple Vitamin (MULTIVITAMIN WITH MINERALS) TABS   Oral   Take 1 tablet by mouth daily.         Marland Kitchen omeprazole (PRILOSEC) 20 MG capsule   Oral   Take 20 mg by mouth daily as needed. For heartburn.         Marland Kitchen OVER THE COUNTER MEDICATION   Oral   Take 30 mLs by mouth daily as needed. Milk of Magnesia with Lactulose. For constipation.         . Oxycodone  HCl 10 MG TABS   Oral   Take 10 mg by mouth every 4 (four) hours as needed. For pain.         Marland Kitchen risperiDONE (RISPERDAL) 2 MG tablet   Oral   Take 2 mg by mouth daily.         . risperiDONE (RISPERDAL) 3 MG tablet   Oral   Take 3 mg by mouth at bedtime.         . saxagliptin HCl (ONGLYZA) 5 MG TABS tablet   Oral   Take 5 mg by mouth daily.          . simethicone (MI-ACID GAS RELIEF) 80 MG chewable tablet   Oral   Chew 80 mg by mouth every 12 (twelve) hours as needed. For gas.         . simvastatin (ZOCOR) 10 MG tablet   Oral   Take 10 mg by mouth at bedtime.         . vitamin C (ASCORBIC ACID) 500 MG tablet   Oral   Take 500 mg by mouth 2 (two) times daily.          BP 132/77  Pulse 104  Temp(Src) 100.7 F (38.2 C) (Oral)  Resp 18  SpO2 96% Physical Exam  Nursing note and vitals reviewed. Constitutional: She is oriented to person, place, and time.  Chronically ill, comfortable, overweight   HENT:  Head: Normocephalic.  Mouth/Throat: Oropharynx is clear and moist.  Eyes: Conjunctivae are normal. Pupils are equal, round, and reactive to light.  Neck: Normal range of motion. Neck supple.  Cardiovascular: Regular rhythm and normal heart sounds.   Slightly tachy   Pulmonary/Chest: Effort normal and breath sounds normal. No respiratory distress. She has no wheezes. She has no rales.  Abdominal: Soft.  Distended. Non tender. Ostomy in place with hard stool   Musculoskeletal: Normal range of motion. She exhibits no edema and no tenderness.  Neurological: She is alert and oriented to person, place, and time. No cranial nerve deficit. Coordination normal.  Skin: Skin is warm  and dry.  Psychiatric: She has a normal mood and affect. Her behavior is normal. Judgment and thought content normal.    ED Course  Procedures (including critical care time) Labs Review Labs Reviewed  CBC WITH DIFFERENTIAL - Abnormal; Notable for the following:    WBC 11.5 (*)     Hemoglobin 11.1 (*)    HCT 34.0 (*)    RDW 15.6 (*)    Neutrophils Relative % 85 (*)    Neutro Abs 9.7 (*)    Lymphocytes Relative 7 (*)    All other components within normal limits  COMPREHENSIVE METABOLIC PANEL - Abnormal; Notable for the following:    Sodium 133 (*)    Chloride 95 (*)    Glucose, Bld 194 (*)    Albumin 2.9 (*)    GFR calc non Af Amer 87 (*)    All other components within normal limits  URINALYSIS, ROUTINE W REFLEX MICROSCOPIC - Abnormal; Notable for the following:    APPearance CLOUDY (*)    Hgb urine dipstick MODERATE (*)    Nitrite POSITIVE (*)    Leukocytes, UA LARGE (*)    All other components within normal limits  URINE MICROSCOPIC-ADD ON - Abnormal; Notable for the following:    Bacteria, UA MANY (*)    All other components within normal limits  CBG MONITORING, ED - Abnormal; Notable for the following:    Glucose-Capillary 173 (*)    All other components within normal limits  CULTURE, BLOOD (ROUTINE X 2)  CULTURE, BLOOD (ROUTINE X 2)  URINE CULTURE  I-STAT CG4 LACTIC ACID, ED   Imaging Review Dg Chest 1 View  03/09/2014   CLINICAL DATA:  Fever, abdominal pain, history of abdominal surgery  EXAM: CHEST - 1 VIEW  COMPARISON:  DG PNEUMONIA CHEST 1V dated 10/20/2010  FINDINGS: Heart size and vascular pattern are normal. Lungs are clear except for mild scarring or discoid atelectasis at the left lung base, similar to prior study. Given the long-term similarity, scarring is favored.  IMPRESSION: No acute findings.  Mild scarring left lung base.   Electronically Signed   By: Skipper Cliche M.D.   On: 03/09/2014 19:31   Dg Abd 1 View  03/09/2014   CLINICAL DATA:  Abdominal distention.  EXAM: ABDOMEN - 1 VIEW  COMPARISON:  CT ABD/PELVIS W CM dated 12/19/2009; DG ABD ACUTE W/CHEST dated 12/19/2009  FINDINGS: There is some gaseous prominence of both the colon and small bowel, likely reflecting ileus. No overt signs of free air are identified. Marked degenerative  changes are present of the spine and hips.  IMPRESSION: Mild prominence of colon and small bowel likely reflecting ileus.   Electronically Signed   By: Aletta Edouard M.D.   On: 03/09/2014 19:29     EKG Interpretation None      MDM   Final diagnoses:  None   Shannon Schmidt is a 73 y.o. female here with fever. Concerned for sepsis. Will do sepsis workup.   8:46 PM UA + UTI, given ceftriaxone. WBC 12. Xray showed ileus. Will admit for UTI, ileus.     Wandra Arthurs, MD 03/09/14 2046

## 2014-03-09 NOTE — ED Notes (Signed)
Per EMS- patient is resident of [facility]. Staff reports Temp-103.0. Patient was given Tylenol by [facility] prior to EMS arrival. Patient states no BM in several days. Patient has a colostomy. patient states she is a "heavy wetter" and her brief has been wet, but not like it usually is.

## 2014-03-10 ENCOUNTER — Inpatient Hospital Stay (HOSPITAL_COMMUNITY): Payer: Medicare Other

## 2014-03-10 DIAGNOSIS — D72829 Elevated white blood cell count, unspecified: Secondary | ICD-10-CM | POA: Diagnosis present

## 2014-03-10 DIAGNOSIS — N390 Urinary tract infection, site not specified: Secondary | ICD-10-CM

## 2014-03-10 DIAGNOSIS — D649 Anemia, unspecified: Secondary | ICD-10-CM | POA: Diagnosis present

## 2014-03-10 DIAGNOSIS — E871 Hypo-osmolality and hyponatremia: Secondary | ICD-10-CM | POA: Diagnosis present

## 2014-03-10 DIAGNOSIS — I10 Essential (primary) hypertension: Secondary | ICD-10-CM | POA: Diagnosis present

## 2014-03-10 DIAGNOSIS — E785 Hyperlipidemia, unspecified: Secondary | ICD-10-CM | POA: Diagnosis present

## 2014-03-10 LAB — COMPREHENSIVE METABOLIC PANEL
ALT: 7 U/L (ref 0–35)
AST: 11 U/L (ref 0–37)
Albumin: 2.7 g/dL — ABNORMAL LOW (ref 3.5–5.2)
Alkaline Phosphatase: 49 U/L (ref 39–117)
BILIRUBIN TOTAL: 0.4 mg/dL (ref 0.3–1.2)
BUN: 16 mg/dL (ref 6–23)
CHLORIDE: 99 meq/L (ref 96–112)
CO2: 26 meq/L (ref 19–32)
Calcium: 8.7 mg/dL (ref 8.4–10.5)
Creatinine, Ser: 0.68 mg/dL (ref 0.50–1.10)
GFR calc Af Amer: >90 mL/min (ref >90)
GFR calc non Af Amer: 86 mL/min — ABNORMAL LOW (ref >90)
Glucose, Bld: 141 mg/dL — ABNORMAL HIGH (ref 70–99)
Potassium: 3.8 mEq/L (ref 3.7–5.3)
Sodium: 135 mEq/L — ABNORMAL LOW (ref 137–147)
Total Protein: 7.5 g/dL (ref 6.0–8.3)

## 2014-03-10 LAB — PREALBUMIN: PREALBUMIN: 7.4 mg/dL — AB (ref 17.0–34.0)

## 2014-03-10 LAB — CBC
HCT: 32.6 % — ABNORMAL LOW (ref 36.0–46.0)
Hemoglobin: 10.4 g/dL — ABNORMAL LOW (ref 12.0–15.0)
MCH: 28.2 pg (ref 26.0–34.0)
MCHC: 31.9 g/dL (ref 30.0–36.0)
MCV: 88.3 fL (ref 78.0–100.0)
PLATELETS: 207 10*3/uL (ref 150–400)
RBC: 3.69 MIL/uL — ABNORMAL LOW (ref 3.87–5.11)
RDW: 15.7 % — AB (ref 11.5–15.5)
WBC: 11.4 10*3/uL — ABNORMAL HIGH (ref 4.0–10.5)

## 2014-03-10 LAB — GLUCOSE, CAPILLARY
GLUCOSE-CAPILLARY: 123 mg/dL — AB (ref 70–99)
GLUCOSE-CAPILLARY: 178 mg/dL — AB (ref 70–99)
GLUCOSE-CAPILLARY: 100 mg/dL — AB (ref 70–99)
Glucose-Capillary: 75 mg/dL (ref 70–99)
Glucose-Capillary: 57 mg/dL — ABNORMAL LOW (ref 70–99)
Glucose-Capillary: 105 mg/dL — ABNORMAL HIGH (ref 70–99)
Glucose-Capillary: 60 mg/dL — ABNORMAL LOW (ref 70–99)

## 2014-03-10 LAB — TSH: TSH: 1.06 u[IU]/mL (ref 0.350–4.500)

## 2014-03-10 LAB — HEMOGLOBIN A1C
Hgb A1c MFr Bld: 6.9 % — ABNORMAL HIGH (ref <5.7)
Mean Plasma Glucose: 151 mg/dL — ABNORMAL HIGH (ref <117)

## 2014-03-10 LAB — MAGNESIUM: Magnesium: 2 mg/dL (ref 1.5–2.5)

## 2014-03-10 LAB — PHOSPHORUS: Phosphorus: 3.1 mg/dL (ref 2.3–4.6)

## 2014-03-10 LAB — MRSA PCR SCREENING: MRSA BY PCR: POSITIVE — AB

## 2014-03-10 MED ORDER — PIPERACILLIN-TAZOBACTAM 3.375 G IVPB
3.3750 g | Freq: Three times a day (TID) | INTRAVENOUS | Status: DC
  Administered 2014-03-10 – 2014-03-12 (×5): 3.375 g via INTRAVENOUS
  Filled 2014-03-10 (×7): qty 50

## 2014-03-10 MED ORDER — DEXTROSE 50 % IV SOLN
1.0000 | Freq: Once | INTRAVENOUS | Status: AC
  Administered 2014-03-10: 50 mL via INTRAVENOUS

## 2014-03-10 MED ORDER — DEXTROSE 50 % IV SOLN
INTRAVENOUS | Status: AC
  Filled 2014-03-10: qty 50

## 2014-03-10 MED ORDER — SODIUM CHLORIDE 0.9 % IV SOLN
INTRAVENOUS | Status: AC
  Administered 2014-03-10: 15:00:00 via INTRAVENOUS

## 2014-03-10 MED ORDER — HYDRALAZINE HCL 20 MG/ML IJ SOLN
5.0000 mg | Freq: Four times a day (QID) | INTRAMUSCULAR | Status: DC | PRN
  Administered 2014-03-15 – 2014-03-16 (×2): 5 mg via INTRAVENOUS
  Filled 2014-03-10 (×2): qty 0.25

## 2014-03-10 MED ORDER — DEXTROSE-NACL 5-0.45 % IV SOLN
INTRAVENOUS | Status: DC
  Administered 2014-03-10 – 2014-03-11 (×2): via INTRAVENOUS

## 2014-03-10 MED ORDER — VANCOMYCIN HCL IN DEXTROSE 1-5 GM/200ML-% IV SOLN
1000.0000 mg | Freq: Two times a day (BID) | INTRAVENOUS | Status: DC
  Administered 2014-03-10 – 2014-03-12 (×5): 1000 mg via INTRAVENOUS
  Filled 2014-03-10 (×6): qty 200

## 2014-03-10 NOTE — Progress Notes (Addendum)
ANTIBIOTIC CONSULT NOTE - INITIAL  Pharmacy Consult for zosyn Indication: gram-negative bacteremia  Allergies  Allergen Reactions  . Ace Inhibitors Other (See Comments)    Unknown   . Chlorpromazine Other (See Comments)    Unknown  . Haloperidol Decanoate Other (See Comments)    Unknown    Patient Measurements: Height: 5\' 10"  (177.8 cm) Weight: 187 lb 13.3 oz (85.2 kg) IBW/kg (Calculated) : 68.5 Adjusted Body Weight:   Vital Signs: Temp: 98.6 F (37 C) (04/13 1825) Temp src: Oral (04/13 1825) BP: 138/65 mmHg (04/13 1526) Pulse Rate: 96 (04/13 1526) Intake/Output from previous day: 04/12 0701 - 04/13 0700 In: 925 [P.O.:90; I.V.:635; IV Piggyback:200] Out: 1 [Stool:1] Intake/Output from this shift:    Labs:  Recent Labs  03/09/14 1847 03/10/14 0412  WBC 11.5* 11.4*  HGB 11.1* 10.4*  PLT 225 207  CREATININE 0.65 0.68   Estimated Creatinine Clearance: 76.6 ml/min (by C-G formula based on Cr of 0.68). No results found for this basename: VANCOTROUGH, Corlis Leak, VANCORANDOM, GENTTROUGH, GENTPEAK, GENTRANDOM, TOBRATROUGH, TOBRAPEAK, TOBRARND, AMIKACINPEAK, AMIKACINTROU, AMIKACIN,  in the last 72 hours   Microbiology: Recent Results (from the past 720 hour(s))  CULTURE, BLOOD (ROUTINE X 2)     Status: None   Collection Time    03/09/14  6:56 PM      Result Value Ref Range Status   Specimen Description BLOOD LEFT ARM  5 ML IN [facility] BOTTLE   Final   Special Requests NONE   Final   Culture  Setup Time     Final   Value: 03/10/2014 02:27     Performed at [facility]   Culture     Final   Value: GRAM NEGATIVE RODS     Note: Gram Stain Report Called to,Read Back By and Verified With: JENNIFER ZAPPIA AT 6:12 P.M. ON 03/10/14 WARRB     Performed at [facility]   Report Status PENDING   Incomplete  MRSA PCR SCREENING     Status: Abnormal   Collection Time    03/10/14  2:30 AM      Result Value Ref Range Status   MRSA by PCR POSITIVE (*) NEGATIVE  Final   Comment:            The GeneXpert MRSA Assay (FDA     approved for NASAL specimens     only), is one component of a     comprehensive MRSA colonization     surveillance program. It is not     intended to diagnose MRSA     infection nor to guide or     monitor treatment for     MRSA infections.     RESULT CALLED TO, READ BACK BY AND VERIFIED WITH:     SPOKE WITH LOCKWWOD,K RN [phone] [place]     Medical History: Past Medical History  Diagnosis Date  . Osteomyelitis   . Schizophrenia   . Bipolar 1 disorder   . Muscle weakness   . Diabetes mellitus   . GERD (gastroesophageal reflux disease)   . PONV (postoperative nausea and vomiting)   . RA (rheumatoid arthritis)   . Colostomy in place   . COPD (chronic obstructive pulmonary disease)   . H/O hiatal hernia   . Depression   . Anemia     Assessment: 23 YOF admitted with fever and UA indicative of UTI, started on ceftriaxone but changed to zosyn 4/13 when blood cultures revealed GNR.  4/12 >>ceftriaxone  >>  4/13 4/13 >>zosyn >>   4/12 >> vancomycin >>  Tmax: 101.2 WBCs: 11.4 Renal: Scr = 0.68 with CrCL = 55ml/min  4/12 blood:GNR 4/12 urine: pending 4/12 wound:  pending   Goal of Therapy:  Dose appropriately per patient-specific parameters  Plan:   Zosyn 3.375gm IV q8h each dose over 4h infusion  Await wound culture and evaluate appropriateness to stop vancomycin  Doreene Eland, PharmD, BCPS.   Pager: [phone]  03/10/2014,6:48 PM

## 2014-03-10 NOTE — Consult Note (Addendum)
WOC wound consult note Reason for Consult: Consult requested for several pressure ulcers.  Pt states these have been present "for a long time."  She has multiple sites from previous skin grafts which have healed. Pt is constantly leaking urine and could benefit from foley to promote healing to wounds if sepsis is no longer a problem; please order if desired. Wound type: Left ischium with stage 4 wound; 7X1X1cm, bone visible, 15% yellow slough, 85% red, small amt tan drainage, no odor. Right inner buttock stage 3 wound 1X1X1cm, 5% yellow, 95% red, small amt tan drainage, no odor Right ischium with grey, macerated scar tissue.  No open wound or drainage but scar has formed a deep valley 4X1X1cm, which is macerated R/T frequent moisture from incontinent urine. Right second toe with full thickness wound .3X.3cm, 100% pink and covered with dressing which appears to be collagen, and pt agrees that this is the case.  This topical treatment is not available at [facility] system and dressing can remain in place until she is discharged to promote healing. Colostomy intact to left lower quad.  Intact with good seal and mod amt semi-formed brown stool. Stoma not visualized at this time. Supplies ordered to bedside for staff nurse use. Pressure Ulcer POA: Yes Dressing procedure/placement/frequency:  Air mattress replacement to minimize pressure to wounds.  Moist gauze packing to left ischium and right buttock wounds to promote healing.  Foam dressing to protect scar tissue to right ischium.  Radiology results pending to R/O osteomyelitis to wounds.  If this is indicated, then this complex medical problem is beyond [facility] nursing scope of practice and would require an ortho consult.  Please re-consult if further assistance is needed.  Thank-you,  Julien Girt MSN, [place], [place], [place], [place]

## 2014-03-10 NOTE — Progress Notes (Signed)
INITIAL NUTRITION ASSESSMENT  DOCUMENTATION CODES Per approved criteria  -Not Applicable   INTERVENTION: -Recommend Glucerna Shake po TID, each supplement provides 220 kcal and 10 grams of protein as diet advancement tolerated -Recommend Beneprotein supplement TID as diet advancement tolerated -Will continue to monitor  NUTRITION DIAGNOSIS: Increased nutrient needs (protein/kcal) related to increased demand for nutrients as evidenced by multiple pressure ulcers.   Goal: Pt to meet >/= 90% of their estimated nutrition needs    Monitor:  Diet order, total protein/energy intake, labs, weights, skin integrity  Reason for Assessment: Low [place]  73 y.o. female  Admitting Dx: Ileus  ASSESSMENT: Patient resides at [facility], she has hx of diabetes and decubitus ulcers sp diverting colostomy. She is bed bound due to hx of RA and neuropathy. She has had fever for the past 3 days and foul smelling urine. Patient used to have indwelling catheter but that was discontinued due to recurrent UTI. Patient had fever up to 103 today and was sent to ER  -Pt was sleeping during time of assessment and un-arousable. Contacted SNF for additional food/nutrition related hx -SNF RN reported that pt was on Reduced Concentrated Sweets diet, pt had excellent appetite with PO intake of 75-100%. Wt stable. Denied dysphagia. -Received Vitamin C tablets at SNF, but no protein supplements. Has tried Pro-Stat protein supplement before, but disliked taste. RN noted that pt may tolerate Glucerna supplement or MagicCup as she has had both in past. Will also add Beneprotein to assist in meeting est nutrition needs for skin integrity -Currently NPO for possible ileus, pt with some abd distension per MD -WOC evaluated pt on 4/13, noted pt with multiple stage 3 and 4 decub ulcers, possible osteo. Bone exposed on left ischium -Admitted with fevers of 103F  Height: Ht Readings from Last 1 Encounters:   03/09/14 5\' 10"  (1.778 m)    Weight: Wt Readings from Last 1 Encounters:  03/09/14 187 lb 13.3 oz (85.2 kg)    Ideal Body Weight: 150 lbs  % Ideal Body Weight: 125%  Wt Readings from Last 10 Encounters:  03/09/14 187 lb 13.3 oz (85.2 kg)  05/08/12 195 lb (88.451 kg)  05/08/12 195 lb (88.451 kg)    Usual Body Weight: 187 lbs  % Usual Body Weight: 100%  BMI:  Body mass index is 26.95 kg/(m^2).  Estimated Nutritional Needs: Kcal: 2000-2200 Protein: 100-110 gram Fluid: >/=2200 ml/daily  Skin: stage 4 ulcer on left ischium, stage 3 ulcer on right inner buttock, right ischium scar tissue, full thickness ulcer on toe  Diet Order: NPO  EDUCATION NEEDS: -No education needs identified at this time   Intake/Output Summary (Last 24 hours) at 03/10/14 1436 Last data filed at 03/10/14 0500  Gross per 24 hour  Intake    925 ml  Output      1 ml  Net    924 ml    Last BM: 4/12   Labs:   Recent Labs Lab 03/09/14 1847 03/10/14 0412  NA 133* 135*  K 3.8 3.8  CL 95* 99  CO2 25 26  BUN 17 16  CREATININE 0.65 0.68  CALCIUM 9.2 8.7  MG  --  2.0  PHOS  --  3.1  GLUCOSE 194* 141*    CBG (last 3)   Recent Labs  03/10/14 0727 03/10/14 1231 03/10/14 1252  GLUCAP 75 57* 178*    Scheduled Meds: . cefTRIAXone (ROCEPHIN)  IV  1 g Intravenous Q24H  . enoxaparin (LOVENOX) injection  40 mg Subcutaneous QHS  . insulin aspart  0-9 Units Subcutaneous 6 times per day  . insulin detemir  50 Units Subcutaneous QHS  . latanoprost  1 drop Both Eyes QHS  . loratadine  10 mg Oral q morning - 10a  . pantoprazole  40 mg Oral Daily  . risperiDONE  1.5 mg Oral BID  . simvastatin  10 mg Oral QHS  . sodium chloride  3 mL Intravenous Q12H  . tiZANidine  2 mg Oral BID  . vancomycin  1,000 mg Intravenous Q12H    Continuous Infusions: . sodium chloride 30 mL/hr at 03/10/14 1121    Past Medical History  Diagnosis Date  . Osteomyelitis   . Schizophrenia   . Bipolar 1  disorder   . Muscle weakness   . Diabetes mellitus   . GERD (gastroesophageal reflux disease)   . PONV (postoperative nausea and vomiting)   . RA (rheumatoid arthritis)   . Colostomy in place   . COPD (chronic obstructive pulmonary disease)   . H/O hiatal hernia   . Depression   . Anemia     Past Surgical History  Procedure Laterality Date  . Knee surgery    . Abdominal hysterectomy    . Joint replacement      knee  . Wound debridement    . Eye surgery      L cataract  . Ganglion cyst excision      R wrist x2  . Breast surgery      L breast abscess  . Hernia repair      abdominal  . Colon surgery    . Cataract extraction w/phaco  05/09/2012    Procedure: CATARACT EXTRACTION PHACO AND INTRAOCULAR LENS PLACEMENT (IOC);  Surgeon: Marylynn Pearson, MD;  Location: [facility];  Service: Ophthalmology;  Laterality: Right;    Atlee Abide MS RD [place] Clinical Dietitian DHRCB:[phone]

## 2014-03-10 NOTE — Progress Notes (Addendum)
[facility] PROGRESS NOTE  EZELLA KELL HEN:[id number] DOB: 1941-05-24 DOA: 03/09/2014 PCP: Theressa Millard, MD  Brief narrative: 73 year-old female with past medical history of diabetes, schizophrenia and bipolar disorder, osteomyelitis, rheumatoid arthritis, sacral decubitus ulcer status post diverting colostomy to allow healing, COPD who presented to [facility] ED 03/09/2014 from [place] for her healthcare Center for evaluation of fevers and foul smelling urine for past few days prior to this admission. Patient used to have indwelling catheter but this was discontinued due to recurrent UTIs. In ED, vital signs were as follows, blood pressure 124/54, RR 18-24, HR 86 - 128 and Tmax 102.8 F, oxygen saturation 91% on room air which has improved to 99% with 2 L nasal cannula. Blood work revealed white blood cell count 11.5, hemoglobin 11.1, sodium 133 and glucose 194. Abdominal x-ray showed mild prominence of colon and small bowel reflecting possible ileus. X-ray showed no acute findings. X-ray of bilateral hips shows pronounced osseous demineralization with degenerative changes, abnormal foci of soft tissue gas inferior to the left inferior pubic ramus and inferior to left inferior pubic ramus without definite bone destruction. Urinalysis showed large leukocytes and too numerous to count white blood cells. Patient was started on Rocephin for UTI and vancomycin for possible osteomyelitis.  Assessment and plan:   Principal problem: Fever, SIRS - On admission, patient had a fever of 102.8 F, RR 24, HR 128 and elevated white blood cell count. Patient also had evidence of urinary tract infection on urinalysis and she does have sacral decubitus ulcer. Her vital signs along with clinical evidence of UTI and even possible osteomyelitis satisfy criteria for sepsis. Lactic acid was not obtained at the time of the admission. - Continue Rocephin and vancomycin - Followup urine and blood culture results. - Patient  may require further evaluation of sacral decubitus ulcer with CT imaging. She is on vancomycin.  Active Problems:   UTI (lower urinary tract infection) - Continue Rocephin  - Followup urine culture results    Ileus - Continue normal saline, 30 cc an hour  - Continue antiemetics as needed  - Continue pain management with oxycodone 15 mg by mouth every 6 hours as needed - Diet as tolerated   Diabetes mellitus - Check A1c  - Continue Levemir 50 units at bedtime and sliding scale insulin    Decubitus ulcers - Consult requested for several pressure ulcers. Patient has multiple sites from previous ischemia grafts which have healed. Wound care recommended Foley for faster healing of wounds but we will defer this for now as patient has urinary tract infection and sepsis. - Left ischium with stage 4 wound: 7x1x1cm, bone visible. Moist gauze packing to left ischium per wound care - Right inner buttock stage 3 wound 1x1x1cm. Right ischium with grey, macerated scar tissue. No open wound or drainage but scar has formed a deep valley 4x1x1cm, which is macerated from frequent moisture from incontinent urine. Moist gauze packing to left ischium per wound. Foam dressing to protect scar tissue to right ischium - Right second toe with full thickness wound 3x.3cm, 100% pink and covered with collagen which s not available at [facility] system so per wound care dressing can remain in place until she is discharged to promote healing.  - Colostomy stable - Mattress overlay order in place   Consultants:  Wound care  Procedures/Studies: Dg Chest 1 View  03/09/2014     IMPRESSION: No acute findings.  Mild scarring left lung base.    Dg Hip  Bilateral W/pelvis 03/10/2014    IMPRESSION: Pronounced osseous demineralization.  Mild degenerative changes of both hip joints.  Abnormal foci of soft tissue gas inferiora to the left inferior pubic ramus, of uncertain antral posterior position.  Soft tissue gas inferior to  left inferior pubic ramus without definite bone destruction.  Anteroposterior position of the gas is uncertain, question related to a posterior ulcer, doubt related to superimposed ostomy bag.  Consider CT imaging of the pelvis with IV contrast to assess for deep soft tissue infection/abscess and to exclude subtle bone destruction/ osteomyelitis.     Dg Abd 1 View  03/09/2014    IMPRESSION: Mild prominence of colon and small bowel likely reflecting ileus.     Antibiotics:  Rocephin 03/09/2014 -->  Vancomycin 03/09/2014-->  Code Status: DNR/DNI Family Communication: Family not at the bedside this am Disposition Plan: Home when medically stable  HPI/Subjective: No events overnight.   Objective: Filed Vitals:   03/09/14 2300 03/10/14 0027 03/10/14 0210 03/10/14 0536  BP:  160/92 124/56 140/94  Pulse:  98 87 86  Temp:  101.9 F (38.8 C) 99 F (37.2 C) 99.5 F (37.5 C)  TempSrc:  Oral  Oral  Resp:   20 20  Height: 5\' 10"  (1.778 m)     Weight: 85.2 kg (187 lb 13.3 oz)     SpO2:  98% 95% 96%    Intake/Output Summary (Last 24 hours) at 03/10/14 1138 Last data filed at 03/10/14 0500  Gross per 24 hour  Intake    925 ml  Output      1 ml  Net    924 ml    Exam:   General:  Pt is sleeping this am, no acute distress  Cardiovascular: Regular rate and rhythm, S1/S2 appreciated  Respiratory: Clear to auscultation bilaterally, no wheezing, no crackles, no rhonchi  Abdomen: Soft, non tender, non distended, bowel sounds present, colostomy  Extremities: No edema, pulses DP and PT palpable bilaterally; has decubitus ulcer  Neuro: no focal neurologic deficits   Data Reviewed: Basic Metabolic Panel:  Recent Labs Lab 03/09/14 1847 03/10/14 0412  NA 133* 135*  K 3.8 3.8  CL 95* 99  CO2 25 26  GLUCOSE 194* 141*  BUN 17 16  CREATININE 0.65 0.68  CALCIUM 9.2 8.7  MG  --  2.0  PHOS  --  3.1   Liver Function Tests:  Recent Labs Lab 03/09/14 1847 03/10/14 0412  AST 12  11  ALT 7 7  ALKPHOS 54 49  BILITOT 0.4 0.4  PROT 8.2 7.5  ALBUMIN 2.9* 2.7*   No results found for this basename: LIPASE, AMYLASE,  in the last 168 hours No results found for this basename: AMMONIA,  in the last 168 hours CBC:  Recent Labs Lab 03/09/14 1847 03/10/14 0412  WBC 11.5* 11.4*  NEUTROABS 9.7*  --   HGB 11.1* 10.4*  HCT 34.0* 32.6*  MCV 87.6 88.3  PLT 225 207   Cardiac Enzymes: No results found for this basename: CKTOTAL, CKMB, CKMBINDEX, TROPONINI,  in the last 168 hours BNP: No components found with this basename: POCBNP,  CBG:  Recent Labs Lab 03/09/14 1842 03/09/14 2300 03/10/14 0426 03/10/14 0727  GLUCAP 173* 179* 123* 75    MRSA PCR SCREENING     Status: Abnormal   Collection Time    03/10/14  2:30 AM      Result Value Ref Range Status   MRSA by PCR POSITIVE (*) NEGATIVE Final  Scheduled Meds: . cefTRIAXone   1 g Intravenous Q24H  . enoxaparin (LOVENOX)   40 mg Subcutaneous QHS  . insulin aspart  0-9 Units Subcutaneous 6 times per day  . insulin detemir  50 Units Subcutaneous QHS  . latanoprost  1 drop Both Eyes QHS  . loratadine  10 mg Oral q morning - 10a  . pantoprazole  40 mg Oral Daily  . risperiDONE  1.5 mg Oral BID  . simvastatin  10 mg Oral QHS  . tiZANidine  2 mg Oral BID  . vancomycin  1,000 mg Intravenous Q12H   Continuous Infusions: . sodium chloride 30 mL/hr at 03/10/14 [place], MD  [facility] Pager [phone]  If 7PM-7AM, please contact night-coverage [email or web address] Password [email or web address] 03/10/2014, 11:38 AM   LOS: 1 day

## 2014-03-10 NOTE — Progress Notes (Signed)
Hypoglycemic Event  CBG:  60  @  2045  Treatment: Orange Juice  Symptoms: Shaking - Increased from patient's normal baseline; otherwise asymptomatic; A&Ox4  Follow-up CBG: Time: 2210 CBG Result: 100  Possible Reasons for Event: Patient is NPO and NS @ KVO  Comments/MD notified: Yes; K.Schorr see MAR    Ethelene Hal  Remember to initiate Hypoglycemia Order Set & complete

## 2014-03-10 NOTE — Progress Notes (Signed)
Prelim blood culture report with g- rods, will change ABX Rocephin to Zosyn.  Faye Ramsay, MD  [facility] Pager [phone]  If 7PM-7AM, please contact night-coverage [email or web address] Password [email or web address]

## 2014-03-10 NOTE — Clinical Social Work Psychosocial (Signed)
     Clinical Social Work Department BRIEF PSYCHOSOCIAL ASSESSMENT 03/10/2014  Patient:  MARGARETANN, ABATE     Account Number:  [id number]     Admit date:  03/09/2014  Clinical Social Worker:  Venia Minks  Date/Time:  03/10/2014 12:00 M  Referred by:  Physician  Date Referred:  03/10/2014 Referred for  SNF Placement   Other Referral:   Interview type:  Family Other interview type:    PSYCHOSOCIAL DATA Living Status:  FACILITY Admitted from facility:  [facility] Level of care:  [place] Primary support name:  Tae Robak Primary support relationship to patient:  CHILD, ADULT Degree of support available:   fair    CURRENT CONCERNS Current Concerns  Post-Acute Placement   Other Concerns:    SOCIAL WORK ASSESSMENT / PLAN Patient was unable to participate in assessment as she was not feeling well and di dnot want to talk. CSW called patient's son, Charlotte Crumb, he confirms that patient is a resident of [facility], long term, and he is happy with her care there. He would like for her to return there upon discharge.   Assessment/plan status:   Other assessment/ plan:   Information/referral to community resources:    PATIENTS/FAMILYS RESPONSE TO PLAN OF CARE: Patient's son continues to be happy with her care at [facility] and is amendable to the plan that she will return there upon discharge.

## 2014-03-11 ENCOUNTER — Inpatient Hospital Stay (HOSPITAL_COMMUNITY): Payer: Medicare Other

## 2014-03-11 LAB — URINE CULTURE

## 2014-03-11 LAB — GLUCOSE, CAPILLARY
GLUCOSE-CAPILLARY: 62 mg/dL — AB (ref 70–99)
GLUCOSE-CAPILLARY: 176 mg/dL — AB (ref 70–99)
Glucose-Capillary: 113 mg/dL — ABNORMAL HIGH (ref 70–99)
Glucose-Capillary: 126 mg/dL — ABNORMAL HIGH (ref 70–99)
Glucose-Capillary: 141 mg/dL — ABNORMAL HIGH (ref 70–99)
Glucose-Capillary: 300 mg/dL — ABNORMAL HIGH (ref 70–99)
Glucose-Capillary: 59 mg/dL — ABNORMAL LOW (ref 70–99)
Glucose-Capillary: 89 mg/dL (ref 70–99)

## 2014-03-11 MED ORDER — METOPROLOL TARTRATE 1 MG/ML IV SOLN
5.0000 mg | Freq: Three times a day (TID) | INTRAVENOUS | Status: DC | PRN
  Administered 2014-03-11: 5 mg via INTRAVENOUS
  Filled 2014-03-11: qty 5

## 2014-03-11 MED ORDER — GLUCERNA SHAKE PO LIQD
237.0000 mL | Freq: Three times a day (TID) | ORAL | Status: DC
  Administered 2014-03-11: 237 mL via ORAL
  Filled 2014-03-11 (×5): qty 237

## 2014-03-11 MED ORDER — INSULIN ASPART 100 UNIT/ML ~~LOC~~ SOLN
0.0000 [IU] | Freq: Three times a day (TID) | SUBCUTANEOUS | Status: DC
  Administered 2014-03-12: 2 [IU] via SUBCUTANEOUS
  Administered 2014-03-12: 5 [IU] via SUBCUTANEOUS
  Administered 2014-03-12: 3 [IU] via SUBCUTANEOUS
  Administered 2014-03-13: 2 [IU] via SUBCUTANEOUS
  Administered 2014-03-13: 7 [IU] via SUBCUTANEOUS
  Administered 2014-03-13: 3 [IU] via SUBCUTANEOUS
  Administered 2014-03-14: 7 [IU] via SUBCUTANEOUS
  Administered 2014-03-14: 3 [IU] via SUBCUTANEOUS
  Administered 2014-03-14 – 2014-03-15 (×2): 5 [IU] via SUBCUTANEOUS

## 2014-03-11 MED ORDER — MUPIROCIN 2 % EX OINT
1.0000 | TOPICAL_OINTMENT | Freq: Two times a day (BID) | CUTANEOUS | Status: AC
Start: 2014-03-11 — End: 2014-03-15
  Administered 2014-03-11 – 2014-03-15 (×10): 1 via NASAL
  Filled 2014-03-11: qty 22

## 2014-03-11 MED ORDER — IOHEXOL 300 MG/ML  SOLN
100.0000 mL | Freq: Once | INTRAMUSCULAR | Status: AC | PRN
  Administered 2014-03-11: 100 mL via INTRAVENOUS

## 2014-03-11 MED ORDER — CHLORHEXIDINE GLUCONATE CLOTH 2 % EX PADS
6.0000 | MEDICATED_PAD | Freq: Every day | CUTANEOUS | Status: DC
  Administered 2014-03-12 – 2014-03-16 (×4): 6 via TOPICAL

## 2014-03-11 MED ORDER — BENEPROTEIN PO POWD
1.0000 | Freq: Three times a day (TID) | ORAL | Status: DC
  Filled 2014-03-11: qty 227

## 2014-03-11 NOTE — Progress Notes (Signed)
Pt refused foley catheter.  Pt stated it gives me an infection and irritates me.  Pt states Dr. Dellia Nims discontinued it years ago.

## 2014-03-11 NOTE — Progress Notes (Signed)
Pt HR 140. Asymptomatic.  MD notified. Will continue to monitor.

## 2014-03-11 NOTE — Progress Notes (Signed)
Pt HR sustaining 140s.  MD notified.

## 2014-03-11 NOTE — Progress Notes (Signed)
Report rec'd from Philemon Kingdom, RN. Agree with previous RN's assessment.

## 2014-03-11 NOTE — Progress Notes (Signed)
Clinical Social Work  Per chart review, patient is not medically stable to DC today. CSW called [employer] to update them on DC plans and to inquire if SNF remains agreeable to accept patient when medically stable. CSW left a message at SNF and will continue to follow.  Sindy Messing, LCSW (Coverage for [employer])

## 2014-03-11 NOTE — Progress Notes (Signed)
Pt BP 106/89, HR 128. Adm Lopressor 5 mg IV.  Will continue to monitor.

## 2014-03-11 NOTE — Progress Notes (Signed)
Hypoglycemic Event  CBG: 62  Treatment:  15 g of Orange Juice  Symptoms: Asymptomatic  Follow-up CBG: Time: 0520 CBG Result: 89  Possible Reasons for Event: Just started D5 added to IVF still NPO  Comments/MD notified: Will notify after recheck; K.Schorr increased D5/1/2NS IVF to 29 ml/hr    Ethelene Hal  Remember to initiate Hypoglycemia Order Set & complete

## 2014-03-11 NOTE — Progress Notes (Signed)
Hypoglycemic Event  CBG: 59  Treatment: 15 GM carbohydrate snack  Symptoms: None  Follow-up CBG: [id number] CBG Result:113  Possible Reasons for Event: Inadequate meal intake  Comments/MD notified:Dr. Doyle Askew notified    Shannon Schmidt  Remember to initiate Hypoglycemia Order Set & complete

## 2014-03-11 NOTE — Progress Notes (Signed)
Patient ID: Shannon Schmidt, female   DOB: [birth year], 73 y.o.   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 Patient used to have indwelling catheter but this was discontinued due to recurrent UTIs.   In ED, vital signs were as follows, blood pressure 124/54, RR 18-24, HR 86 - 128 and Tmax 102.8 F, oxygen saturation 91% on room air which has improved to 99% with 2 L nasal cannula. Blood work revealed white blood cell count 11.5, hemoglobin 11.1, sodium 133 and glucose 194. Abdominal x-ray showed mild prominence of colon and small bowel reflecting possible ileus. X-ray showed no acute findings. X-ray of bilateral hips shows pronounced osseous demineralization with degenerative changes, abnormal foci of soft tissue gas inferior to the left inferior pubic ramus and inferior to left inferior pubic ramus without definite bone destruction. Urinalysis showed large leukocytes and too numerous to count white blood cells. Patient was started on Rocephin for UTI and vancomycin for possible osteomyelitis. Preliminary blood culture 4/13 positive for gram - rods, pt persistently febrile with Tmax 102 F (over the 24 hours 4/13) and ABX Rocephin changed to Zosyn.   Assessment and plan:  Principal problem:  Sepsis secondary to E. Coli UTI, bacteremia  - On admission, patient had a fever of 102.8 F, RR 24, HR 128 and elevated white blood cell count - Patient also had evidence of urinary tract infection on UA and has sacral decubitus ulcer.  - Her  vital signs along with clinical evidence of UTI and even possible osteomyelitis satisfy criteria for sepsis. Lactic acid was not obtained at the time of the admission.  - pt initially started on Vancomycin and Rocephin, due to persistent fevers and prelim blood culture positive for g-rods, Rocephin was changed to Zosyn  - Followup on final urine and blood culture results, repeat blood cultures - will obtain CT pelvis with contrast to further evaluate sacral decubitus ulcer - pt is clinically improving and wants to eat this AM, reports feeling better  Active Problems:  UTI (lower urinary tract infection)  - urine culture positive for E. Coli and sensitive to Zosyn and Cipro - will continue Zosyn as noted above, day #2 Ileus  - Continue antiemetics as needed  - Continue pain management with oxycodone 15 mg by mouth every 6 hours as needed  - Diet as tolerated  - monitor bowel regimen  Diabetes mellitus  - A1c 6.9, pt with several hypoglycemic episode over the past 24 hours but was also getting levemir 50 Units  - will hold off on Levimir until oral intake improves  - will advance diet and will only continue SSI for now  Decubitus ulcers  - Consult requested for several pressure ulcers. Patient has multiple sites from previous ischemia grafts which have healed.  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 Moist gauze packing to left ischium per wound care  - Right inner buttock stage 3 wound 1x1x1cm. Right ischium with grey, macerated scar tissue. No open wound  or drainage but scar has formed a deep valley 4x1x1cm, which is macerated from frequent moisture from incontinent urine. Moist gauze packing to left ischium per wound. CT pelvis with IV contrast requested for further evaluation.  - Right second toe with full thickness wound 3x.3cm, 100% pink and covered with collagen which s not available at  [facility] system so per wound care dressing can remain in place until she is discharged to promote healing.  - Colostomy stable  - Mattress overlay order in place   Consultants:  Wound care Procedures/Studies:  Dg Chest 1 View 03/09/2014  No acute findings. Mild scarring left lung base.  Dg Hip Bilateral W/pelvis Apr 04, 2014 Pronounced osseous demineralization. Mild degenerative changes of both hip joints. Abnormal foci of soft tissue gas inferiora to the left inferior pubic ramus, of uncertain antral posterior position. Soft tissue gas inferior to left inferior pubic ramus without definite bone destruction. Anteroposterior position of the gas is uncertain, question related to a posterior ulcer, doubt related to superimposed ostomy bag. Consider CT imaging of the pelvis with IV contrast to assess for deep soft tissue infection/abscess and to exclude subtle bone destruction/ osteomyelitis.  Dg Abd 1 View 03/09/2014  Mild prominence of colon and small bowel likely reflecting ileus.  Antibiotics:  Rocephin 03/09/2014 --> 05-Apr-2023 Vancomycin 03/09/2014--> Zosyn 04-05-23 -->   Code Status: DNR/DNI  Family Communication: Family not at the bedside this am  Disposition Plan: Remains inpatient    HPI/Subjective: No events overnight.   Objective: Filed Vitals:   2014-04-04 2132 04-Apr-2014 2230 03/11/14 0519 03/11/14 1103  BP: 152/85 132/77 134/71   Pulse: 110  102   Temp: 98.8 F (37.1 C)  99.7 F (37.6 C) 99.9 F (37.7 C)  TempSrc: Oral  Oral Oral  Resp: 20  22   Height:      Weight:      SpO2: 100%  96%     Intake/Output Summary (Last 24 hours) at 03/11/14 1149 Last data filed at 03/11/14 1100  Gross per 24 hour  Intake 1894.59 ml  Output     25 ml  Net 1869.59 ml    Exam:   General:  Pt is alert, follows commands appropriately, not in acute distress  Cardiovascular: Regular rate and rhythm, S1/S2, no rubs, no gallops  Respiratory: Clear to auscultation bilaterally, no wheezing,  diminished breath sounds at bases   Abdomen: Soft, non tender, non distended, bowel sounds present, no guarding  Data Reviewed: Basic Metabolic Panel:  Recent Labs Lab 03/09/14 1847 2014/04/04 0412  NA 133* 135*  K 3.8 3.8  CL 95* 99  CO2 25 26  GLUCOSE 194* 141*  BUN 17 16  CREATININE 0.65 0.68  CALCIUM 9.2 8.7  MG  --  2.0  PHOS  --  3.1   Liver Function Tests:  Recent Labs Lab 03/09/14 1847 Apr 04, 2014 0412  AST 12 11  ALT 7 7  ALKPHOS 54 49  BILITOT 0.4 0.4  PROT 8.2 7.5  ALBUMIN 2.9* 2.7*   CBC:  Recent Labs Lab 03/09/14 1847 04/04/14 0412  WBC 11.5* 11.4*  NEUTROABS 9.7*  --   HGB 11.1* 10.4*  HCT 34.0* 32.6*  MCV 87.6 88.3  PLT 225 207   CBG:  Recent Labs Lab 03/11/14 0006 03/11/14 0400 03/11/14 0522 03/11/14 0732 03/11/14 0815  GLUCAP 141* 62* 89 59* 113*    Recent Results (from the past 240 hour(s))  CULTURE, BLOOD (ROUTINE X 2)     Status: None  Collection Time    03/09/14  6:44 PM      Result Value Ref Range Status   Specimen Description BLOOD RIGHT ARM   Final   Special Requests     Final   Value: BOTTLES DRAWN AEROBIC AND ANAEROBIC 5 CC BOTH BOTTLES   Culture  Setup Time     Final   Value: 03/10/2014 02:26     Performed at [facility]   Culture     Final   Value:        BLOOD CULTURE RECEIVED NO GROWTH TO DATE CULTURE WILL BE HELD FOR 5 DAYS BEFORE ISSUING A FINAL NEGATIVE REPORT     Performed at [facility]   Report Status PENDING   Incomplete  CULTURE, BLOOD (ROUTINE X 2)     Status: None   Collection Time    03/09/14  6:56 PM      Result Value Ref Range Status   Specimen Description BLOOD LEFT ARM  5 ML IN [facility] BOTTLE   Final   Special Requests NONE   Final   Culture  Setup Time     Final   Value: 03/10/2014 02:27     Performed at [facility]   Culture     Final   Value: ESCHERICHIA COLI     Note: Gram Stain Report Called to,Read Back By and Verified With: JENNIFER ZAPPIA AT 6:12 P.M. ON  03/10/14 WARRB     Performed at [facility]   Report Status PENDING   Incomplete  URINE CULTURE     Status: None   Collection Time    03/09/14  7:32 PM      Result Value Ref Range Status   Specimen Description URINE, CATHETERIZED   Final   Special Requests NONE   Final   Culture  Setup Time     Final   Value: 03/10/2014 03:34     Performed at [place] [facility]     Final   Value: >=100,000 COLONIES/ML     Performed at [facility]   Culture     Final   Value: ESCHERICHIA COLI     Performed at [facility]   Report Status PENDING   Incomplete  WOUND CULTURE     Status: None   Collection Time    03/10/14  2:30 AM      Result Value Ref Range Status   Specimen Description WOUND LEFT BUTTOCKS   Final   Special Requests Normal   Final   Gram Stain     Final   Value: FEW WBC PRESENT,BOTH PMN AND MONONUCLEAR     NO SQUAMOUS EPITHELIAL CELLS SEEN     RARE GRAM POSITIVE COCCI     IN PAIRS RARE GRAM POSITIVE RODS     Performed at [facility]   Culture     Final   Value: Culture reincubated for better growth     Performed at [facility]   Report Status PENDING   Incomplete  MRSA PCR SCREENING     Status: Abnormal   Collection Time    03/10/14  2:30 AM      Result Value Ref Range Status   MRSA by PCR POSITIVE (*) NEGATIVE Final   Comment:            The GeneXpert MRSA Assay (FDA     approved for NASAL specimens     only), is one  component of a     comprehensive MRSA colonization     surveillance program. It is not     intended to diagnose MRSA     infection nor to guide or     monitor treatment for     MRSA infections.     RESULT CALLED TO, READ BACK BY AND VERIFIED WITH:     SPOKE WITH LOCKWWOD,K RN [phone] COVINGTON,N      Scheduled Meds: . enoxaparin (LOVENOX) injection  40 mg Subcutaneous QHS  . insulin aspart  0-9 Units Subcutaneous 6 times per day  . insulin detemir  50 Units Subcutaneous QHS  .  latanoprost  1 drop Both Eyes QHS  . loratadine  10 mg Oral q morning - 10a  . pantoprazole  40 mg Oral Daily  . piperacillin-tazobactam (ZOSYN)  IV  3.375 g Intravenous 3 times per day  . risperiDONE  1.5 mg Oral BID  . simvastatin  10 mg Oral QHS  . sodium chloride  3 mL Intravenous Q12H  . tiZANidine  2 mg Oral BID  . vancomycin  1,000 mg Intravenous Q12H   Continuous Infusions: . dextrose 5 % and 0.45% NaCl 75 mL/hr at 03/11/14 0600     Theodis Blaze, MD  [facility] Pager [phone]  If 7PM-7AM, please contact night-coverage [email or web address] Password [email or web address] 03/11/2014, 11:49 AM   LOS: 2 days

## 2014-03-11 NOTE — Consult Note (Signed)
WOC consult requested: Performed on 4/13; refer to previous progress notes for assessment and plan of care. Please re-consult if further assistance is needed.  Thank-you,  Julien Girt MSN, [place], [place], [place], [place]

## 2014-03-12 LAB — BASIC METABOLIC PANEL
BUN: 13 mg/dL (ref 6–23)
CHLORIDE: 99 meq/L (ref 96–112)
CO2: 25 mEq/L (ref 19–32)
CREATININE: 0.83 mg/dL (ref 0.50–1.10)
Calcium: 8.6 mg/dL (ref 8.4–10.5)
GFR calc non Af Amer: 69 mL/min — ABNORMAL LOW (ref >90)
GFR, EST AFRICAN AMERICAN: 80 mL/min — AB (ref >90)
Glucose, Bld: 196 mg/dL — ABNORMAL HIGH (ref 70–99)
POTASSIUM: 3.5 meq/L — AB (ref 3.7–5.3)
Sodium: 137 mEq/L (ref 137–147)

## 2014-03-12 LAB — CBC
HEMATOCRIT: 31 % — AB (ref 36.0–46.0)
Hemoglobin: 9.9 g/dL — ABNORMAL LOW (ref 12.0–15.0)
MCH: 27.4 pg (ref 26.0–34.0)
MCHC: 31.9 g/dL (ref 30.0–36.0)
MCV: 85.9 fL (ref 78.0–100.0)
PLATELETS: 246 10*3/uL (ref 150–400)
RBC: 3.61 MIL/uL — AB (ref 3.87–5.11)
RDW: 16 % — AB (ref 11.5–15.5)
WBC: 9.2 10*3/uL (ref 4.0–10.5)

## 2014-03-12 LAB — GLUCOSE, CAPILLARY
GLUCOSE-CAPILLARY: 166 mg/dL — AB (ref 70–99)
GLUCOSE-CAPILLARY: 249 mg/dL — AB (ref 70–99)
Glucose-Capillary: 255 mg/dL — ABNORMAL HIGH (ref 70–99)
Glucose-Capillary: 281 mg/dL — ABNORMAL HIGH (ref 70–99)

## 2014-03-12 LAB — CULTURE, BLOOD (ROUTINE X 2)

## 2014-03-12 LAB — WOUND CULTURE: Special Requests: NORMAL

## 2014-03-12 MED ORDER — DEXTROSE 5 % IV SOLN
1.0000 g | INTRAVENOUS | Status: DC
  Administered 2014-03-12: 1 g via INTRAVENOUS
  Filled 2014-03-12: qty 10

## 2014-03-12 MED ORDER — DIPHENHYDRAMINE HCL 25 MG PO CAPS
25.0000 mg | ORAL_CAPSULE | ORAL | Status: DC | PRN
  Administered 2014-03-13: 25 mg via ORAL
  Filled 2014-03-12: qty 1

## 2014-03-12 MED ORDER — DIPHENHYDRAMINE HCL 50 MG/ML IJ SOLN
25.0000 mg | Freq: Once | INTRAMUSCULAR | Status: AC
  Administered 2014-03-12: 25 mg via INTRAVENOUS
  Filled 2014-03-12: qty 1

## 2014-03-12 NOTE — Progress Notes (Signed)
ANTIBIOTIC CONSULT NOTE - INITIAL  Pharmacy Consult for Ceftriaxone Indication: UTI, bacteremia  Allergies  Allergen Reactions  . Ace Inhibitors Other (See Comments)    Unknown   . Chlorpromazine Other (See Comments)    Unknown  . Haloperidol Decanoate Other (See Comments)    Unknown    Patient Measurements: Height: 5\' 10"  (177.8 cm) Weight: 187 lb 13.3 oz (85.2 kg) IBW/kg (Calculated) : 68.5  Vital Signs: Temp: 98.8 F (37.1 C) (04/15 0850) Temp src: Oral (04/15 0850) BP: 134/56 mmHg (04/15 0414) Pulse Rate: 93 (04/15 0414) Intake/Output from previous day: 04/14 0701 - 04/15 0700 In: 1273 [P.O.:720; I.V.:3; IV Piggyback:550] Out: -   Labs:  Recent Labs  03/09/14 1847 03/10/14 0412 03/12/14 0356  WBC 11.5* 11.4* 9.2  HGB 11.1* 10.4* 9.9*  PLT 225 207 246  CREATININE 0.65 0.68 0.83   Estimated Creatinine Clearance: 73.8 ml/min (by C-G formula based on Cr of 0.83).   Microbiology: Recent Results (from the past 720 hour(s))  CULTURE, BLOOD (ROUTINE X 2)     Status: None   Collection Time    03/09/14  6:44 PM      Result Value Ref Range Status   Specimen Description BLOOD RIGHT ARM   Final   Special Requests     Final   Value: BOTTLES DRAWN AEROBIC AND ANAEROBIC 5 CC BOTH BOTTLES   Culture  Setup Time     Final   Value: 03/10/2014 02:26     Performed at [facility]   Culture     Final   Value:        BLOOD CULTURE RECEIVED NO GROWTH TO DATE CULTURE WILL BE HELD FOR 5 DAYS BEFORE ISSUING A FINAL NEGATIVE REPORT     Performed at [facility]   Report Status PENDING   Incomplete  CULTURE, BLOOD (ROUTINE X 2)     Status: None   Collection Time    03/09/14  6:56 PM      Result Value Ref Range Status   Specimen Description BLOOD LEFT ARM  5 ML IN [facility] BOTTLE   Final   Special Requests NONE   Final   Culture  Setup Time     Final   Value: 03/10/2014 02:27     Performed at [facility]   Culture     Final   Value: ESCHERICHIA COLI      Note: Gram Stain Report Called to,Read Back By and Verified With: JENNIFER ZAPPIA AT 6:12 P.M. ON 03/10/14 WARRB     Performed at [facility]   Report Status 03/12/2014 FINAL   Final   Organism ID, Bacteria ESCHERICHIA COLI   Final  URINE CULTURE     Status: None   Collection Time    03/09/14  7:32 PM      Result Value Ref Range Status   Specimen Description URINE, CATHETERIZED   Final   Special Requests NONE   Final   Culture  Setup Time     Final   Value: 03/10/2014 03:34     Performed at [place]     Final   Value: >=100,000 COLONIES/ML     Performed at [facility]   Culture     Final   Value: ESCHERICHIA COLI     Performed at [facility]   Report Status 03/11/2014 FINAL   Final   Organism ID, Bacteria ESCHERICHIA COLI   Final  WOUND CULTURE     Status: None   Collection Time    03/10/14  2:30 AM      Result Value Ref Range Status   Specimen Description WOUND LEFT BUTTOCKS   Final   Special Requests Normal   Final   Gram Stain     Final   Value: FEW WBC PRESENT,BOTH PMN AND MONONUCLEAR     NO SQUAMOUS EPITHELIAL CELLS SEEN     RARE GRAM POSITIVE COCCI     IN PAIRS RARE GRAM POSITIVE RODS     Performed at [facility]   Culture     Final   Value: Culture reincubated for better growth     Performed at [facility]   Report Status PENDING   Incomplete  MRSA PCR SCREENING     Status: Abnormal   Collection Time    03/10/14  2:30 AM      Result Value Ref Range Status   MRSA by PCR POSITIVE (*) NEGATIVE Final   Comment:            The GeneXpert MRSA Assay (FDA     approved for NASAL specimens     only), is one component of a     comprehensive MRSA colonization     surveillance program. It is not     intended to diagnose MRSA     infection nor to guide or     monitor treatment for     MRSA infections.     RESULT CALLED TO, READ BACK BY AND VERIFIED WITH:     SPOKE WITH LOCKWWOD,K RN [phone]  [place],N     Anti-infectives: 4/12 >>Rocephin >> 4/13 4/12 >>Vanc >> 4/15 4/13 >> zosyn >> 4/15 4/15 >> Ceftriaxone >>   Assessment: 4 YOF admitted 4/12 with fever and UA indicative of UTI.  She has h/o osteomyelitis and decubitus ulcers.  She was initially started of ceftriaxone and pharmacy was consulted to dose vancomycin.  On 4/13, ceftriaxone was changed to Zosyn when blood cultures revealed GNR.  Blood and urine cultures now growing Ecoli.  Vanc and Zosyn are d/c, pharmacy is consulted to dose ceftriaxone.   Day #4 antibiotics (1 day ceftriaxone, 3 days Zosyn)  Tmax: 102.3 (last APAP 4/14 1417)  WBCs: 11.4 > improved to 9.2  Renal: Scr 0.83 with CrCl ~ 74 ml/min  Blood and urine cultures with Ecoli (sens: cefazolin, cefepime, ceftaz, ceftriaxone, gent, imipenem, pip/tazo)  Goal of Therapy:  Appropriate abx dosing, eradication of infection.   Plan:   Ceftriaxone 1g IV q24h  Gretta Arab PharmD, BCPS Pager [phone] 03/12/2014 12:51 PM

## 2014-03-12 NOTE — Progress Notes (Signed)
When doing bedside rounding, pt. States "i've been itching since you gave me that antibiotic" which was scheduled at 1400 but just now telling RN. Patient states "this is what has happened in the past when I've had an allergy to antibiotics and I know this is a new one you started this afternoon." Night coverage MD paged with findings. Will continue to monitor.

## 2014-03-12 NOTE — Progress Notes (Signed)
[facility] PROGRESS NOTE  Shannon Schmidt CWC:[id number] DOB: [birth year] DOA: 03/09/2014 PCP: Theressa Millard, MD  Assessment/Plan: Principal problem:  Sepsis secondary to E. Coli UTI, bacteremia  - On admission, patient had a fever of 102.8 F, RR 24, HR 128 and elevated white blood cell count  - Patient also had evidence of urinary tract infection on UA and has sacral decubitus ulcer.  - pt initially started on Vancomycin and Rocephin, due to persistent fevers and prelim blood culture positive for g-rods.  E coli growing in urine culture as such IV antibiotics narrowed to Rocephin 03/12/14 - will obtain CT pelvis with contrast to further evaluate sacral decubitus ulcer: Reporting Chronic osteomyelitis which I will treat with Rocephin at this juncture.  Will contact ID next am 03/13/14 for further recommendations regarding length of therapy and any further recommendations from their standpoint.  Active Problems:  UTI (lower urinary tract infection)  - urine culture positive for E. Coli and sensitivities reviewed.  Ileus  - Resolving - continue supportive therapy.  Diabetes mellitus  -SSI and carb modified diet. - Will continue to hold levemir.  Decubitus ulcers  - Consult requested for several pressure ulcers. Patient has multiple sites from previous ischemia grafts which have healed.  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 Moist gauze packing to left ischium per wound care  - Right inner buttock stage 3 wound 1x1x1cm. Right ischium with grey, macerated scar tissue. No open wound or drainage but scar has formed a deep valley 4x1x1cm, which is macerated from frequent moisture from incontinent urine. Moist gauze packing to left ischium per wound.  - Right second toe with full thickness wound 3x.3cm, 100% pink and covered with collagen which s not available at [facility] system  so per wound care dressing can remain in place until she is discharged to promote healing.  - Colostomy stable  - Mattress overlay order in place    Code Status: DNR Family Communication: discussed directly with patient. Disposition Plan: Pending improvement in condition   Consultants:  Wound care nurse  Procedures:  Dg Chest 1 View 03/09/2014 No acute findings. Mild scarring left lung base. Dg Hip Bilateral W/pelvis March 16, 2014 Pronounced osseous demineralization. Mild degenerative changes of both hip joints. Abnormal foci of soft tissue gas inferiora to the left inferior pubic ramus, of uncertain antral posterior position. Soft tissue gas inferior to left inferior pubic ramus without definite bone destruction. Anteroposterior position of the gas is uncertain, question related to a posterior ulcer, doubt related to superimposed ostomy bag. Consider CT imaging of the pelvis with IV contrast to assess for deep soft tissue infection/abscess and to exclude subtle bone destruction/ osteomyelitis. Dg Abd 1 View 03/09/2014 Mild prominence of colon and small bowel likely reflecting ileus.    Antibiotics: Rocephin 4/15 Vancomycin 03/09/2014--> 4/15 Zosyn 03-17-2023 --> 4/15  HPI/Subjective: No new complaints. No acute issues overnight reported to me.  Objective: Filed Vitals:   03/12/14 1322  BP: 114/51  Pulse: 92  Temp: 97.7 F (36.5 C)  Resp: 20    Intake/Output Summary (Last 24 hours) at 03/12/14 1509 Last data filed at 03/12/14 1407  Gross per 24 hour  Intake   1123 ml  Output      0 ml  Net   1123 ml   Filed Weights   03/09/14 2300  Weight: 85.2 kg (187 lb 13.3 oz)  Exam:   General:  Pt in NAD, alert and awake  Cardiovascular: RRR, no MRG  Respiratory: CTA BL, no wheezes  Abdomen: soft, + suprapubic discomfort, ND  Data Reviewed: Basic Metabolic Panel:  Recent Labs Lab 03/09/14 1847 03/10/14 0412 03/12/14 0356  NA 133* 135* 137  K 3.8 3.8 3.5*  CL 95* 99 99   CO2 25 26 25   GLUCOSE 194* 141* 196*  BUN 17 16 13   CREATININE 0.65 0.68 0.83  CALCIUM 9.2 8.7 8.6  MG  --  2.0  --   PHOS  --  3.1  --    Liver Function Tests:  Recent Labs Lab 03/09/14 1847 03/10/14 0412  AST 12 11  ALT 7 7  ALKPHOS 54 49  BILITOT 0.4 0.4  PROT 8.2 7.5  ALBUMIN 2.9* 2.7*   No results found for this basename: LIPASE, AMYLASE,  in the last 168 hours No results found for this basename: AMMONIA,  in the last 168 hours CBC:  Recent Labs Lab 03/09/14 1847 03/10/14 0412 03/12/14 0356  WBC 11.5* 11.4* 9.2  NEUTROABS 9.7*  --   --   HGB 11.1* 10.4* 9.9*  HCT 34.0* 32.6* 31.0*  MCV 87.6 88.3 85.9  PLT 225 207 246   Cardiac Enzymes: No results found for this basename: CKTOTAL, CKMB, CKMBINDEX, TROPONINI,  in the last 168 hours BNP (last 3 results) No results found for this basename: PROBNP,  in the last 8760 hours CBG:  Recent Labs Lab 03/11/14 1201 03/11/14 1650 03/11/14 1947 03/12/14 0746 03/12/14 1145  GLUCAP 126* 176* 300* 166* 249*    Recent Results (from the past 240 hour(s))  CULTURE, BLOOD (ROUTINE X 2)     Status: None   Collection Time    03/09/14  6:44 PM      Result Value Ref Range Status   Specimen Description BLOOD RIGHT ARM   Final   Special Requests     Final   Value: BOTTLES DRAWN AEROBIC AND ANAEROBIC 5 CC BOTH BOTTLES   Culture  Setup Time     Final   Value: 03/10/2014 02:26     Performed at [facility]   Culture     Final   Value:        BLOOD CULTURE RECEIVED NO GROWTH TO DATE CULTURE WILL BE HELD FOR 5 DAYS BEFORE ISSUING A FINAL NEGATIVE REPORT     Performed at [facility]   Report Status PENDING   Incomplete  CULTURE, BLOOD (ROUTINE X 2)     Status: None   Collection Time    03/09/14  6:56 PM      Result Value Ref Range Status   Specimen Description BLOOD LEFT ARM  5 ML IN [facility] BOTTLE   Final   Special Requests NONE   Final   Culture  Setup Time     Final   Value: 03/10/2014 02:27      Performed at [facility]   Culture     Final   Value: ESCHERICHIA COLI     Note: Gram Stain Report Called to,Read Back By and Verified With: JENNIFER ZAPPIA AT 6:12 P.M. ON 03/10/14 [facility]     Performed at [facility]   Report Status 03/12/2014 FINAL   Final   Organism ID, Bacteria ESCHERICHIA COLI   Final  URINE CULTURE     Status: None   Collection Time    03/09/14  7:32 PM  Result Value Ref Range Status   Specimen Description URINE, CATHETERIZED   Final   Special Requests NONE   Final   Culture  Setup Time     Final   Value: 03/10/2014 03:34     Performed at [facility]     Final   Value: >=100,000 COLONIES/ML     Performed at [facility]   Culture     Final   Value: ESCHERICHIA COLI     Performed at [facility]   Report Status 03/11/2014 FINAL   Final   Organism ID, Bacteria ESCHERICHIA COLI   Final  WOUND CULTURE     Status: None   Collection Time    03/10/14  2:30 AM      Result Value Ref Range Status   Specimen Description WOUND LEFT BUTTOCKS   Final   Special Requests Normal   Final   Gram Stain     Final   Value: FEW WBC PRESENT,BOTH PMN AND MONONUCLEAR     NO SQUAMOUS EPITHELIAL CELLS SEEN     RARE GRAM POSITIVE COCCI     IN PAIRS RARE GRAM POSITIVE RODS     Performed at [facility]   Culture     Final   Value: FEW GROUP B STREP(S.AGALACTIAE)ISOLATED     Note: TESTING AGAINST S. AGALACTIAE NOT ROUTINELY PERFORMED DUE TO PREDICTABILITY OF AMP/PEN/VAN SUSCEPTIBILITY.     Performed at [facility]   Report Status 03/12/2014 FINAL   Final  MRSA PCR SCREENING     Status: Abnormal   Collection Time    03/10/14  2:30 AM      Result Value Ref Range Status   MRSA by PCR POSITIVE (*) NEGATIVE Final   Comment:            The GeneXpert MRSA Assay (FDA     approved for NASAL specimens     only), is one component of a     comprehensive MRSA colonization     surveillance program. It is not      intended to diagnose MRSA     infection nor to guide or     monitor treatment for     MRSA infections.     RESULT CALLED TO, READ BACK BY AND VERIFIED WITH:     SPOKE WITH LOCKWWOD,K RN [phone] [id number] [place],N      Studies: Ct Pelvis W Contrast  03/11/2014   CLINICAL DATA:  Pelvic pain. Fever. Query abscess or osteomyelitis.  EXAM: CT PELVIS WITH CONTRAST  TECHNIQUE: Multidetector CT imaging of the pelvis was performed using the standard protocol following the bolus administration of intravenous contrast.  CONTRAST:  121mL OMNIPAQUE IOHEXOL 300 MG/ML  SOLN  COMPARISON:  DG HIP BILATERAL W/PELVIS dated 03/10/2014; CT ABD/PELVIS W CM dated 12/19/2009  FINDINGS: Aortoiliac atherosclerotic vascular disease noted.  Left colostomy.  Urinary bladder unremarkable.  Large bilateral decubitus ulcers extend to the bony surface of the ischial tuberosities which demonstrate sclerosis and spurring. The sclerosis is quite likely an indicator of chronic osteomyelitis given the exposure of the bone to the ulcerative wounds.  Decubitus ulcer extends towards the right greater trochanter which demonstrates flattening and extensive spurring. No definite bony destructive findings to suggest current osteomyelitis of the right greater trochanter. There appears to be some separation of the ulceration from the bony margin of the greater trochanter by a granulation tissue.  A smaller ulceration overlies the left greater trochanter  but is separated from the trochanter by muscle and subcutaneous tissues.  IMPRESSION: 1. Bilateral decubitus ulcers extending to both ischial tuberosities, with sclerosis at the ischial tuberosities favoring chronic osteomyelitis. 2. Deep decubitus ulcer extending towards but not definitively reaching the right greater trochanter. There is underlying trochanteric deformity on the right but I doubt that there is active osteomyelitis involving the right greater trochanter. 3. Smaller superficial decubitus  ulcer overlying the left greater trochanter.   Electronically Signed   By: Sherryl Barters M.D.   On: 03/11/2014 21:21    Scheduled Meds: . cefTRIAXone (ROCEPHIN)  IV  1 g Intravenous Q24H  . Chlorhexidine Gluconate Cloth  6 each Topical Q0600  . enoxaparin (LOVENOX) injection  40 mg Subcutaneous QHS  . insulin aspart  0-9 Units Subcutaneous TID WC  . latanoprost  1 drop Both Eyes QHS  . loratadine  10 mg Oral q morning - 10a  . mupirocin ointment  1 application Nasal BID  . pantoprazole  40 mg Oral Daily  . risperiDONE  1.5 mg Oral BID  . simvastatin  10 mg Oral QHS  . sodium chloride  3 mL Intravenous Q12H  . tiZANidine  2 mg Oral BID   Continuous Infusions:    Time spent: > 35 minutes    [facility] Pager [phone]. If 7PM-7AM, please contact night-coverage at [email or web address], password [facility] 03/12/2014, 3:09 PM  LOS: 3 days

## 2014-03-12 NOTE — Progress Notes (Signed)
Nutrition Note  -Pt's diet order advanced to Carb Mod on 4/14 -Ordered Glucerna shakes BID and Beneprotein supplements on 4/14 -Followed up with pt on supplement preferences. Pt has been refusing as she does not like taste. Was willing to incorporate high protein snacks to assist in skin integrity. Will d'c supplements and encourage PO intake -PO intake 75-100%. Pt denied any nausea/abd pain post meals  Will continue to monitor. Please re-consult as needed.  Atlee Abide MS RD LDN Clinical Dietitian IBBCW:[phone]

## 2014-03-12 NOTE — Progress Notes (Addendum)
Inpatient Diabetes Program Recommendations  AACE/ADA: New Consensus Statement on Inpatient Glycemic Control (2013)  Target Ranges:  Prepandial:   less than 140 mg/dL      Peak postprandial:   less than 180 mg/dL (1-2 hours)      Critically ill patients:  140 - 180 mg/dL   Reason for Assessment:  Note CBG's low on admission, however now CBG's increased.  Diabetes history: Type 2 diabetes Outpatient Diabetes medications: Levemir 24 units in the AM and 55 units q PM, Humalog correction scale, Metformin 500 mg daily   Current orders for Inpatient glycemic control: Novolog sensitive tid with meals   May consider restarting a portion of patient's NH dose of Levemir.  Consider restarting the AM dose of Levemir 24 units q AM  (while in the hospital).     Thanks, Adah Perl, RN, BC-ADM Inpatient Diabetes Coordinator Pager [phone]

## 2014-03-13 ENCOUNTER — Encounter (HOSPITAL_COMMUNITY): Payer: Self-pay | Admitting: Internal Medicine

## 2014-03-13 DIAGNOSIS — N12 Tubulo-interstitial nephritis, not specified as acute or chronic: Secondary | ICD-10-CM

## 2014-03-13 DIAGNOSIS — A498 Other bacterial infections of unspecified site: Secondary | ICD-10-CM

## 2014-03-13 DIAGNOSIS — L89109 Pressure ulcer of unspecified part of back, unspecified stage: Secondary | ICD-10-CM

## 2014-03-13 LAB — GLUCOSE, CAPILLARY
GLUCOSE-CAPILLARY: 157 mg/dL — AB (ref 70–99)
GLUCOSE-CAPILLARY: 215 mg/dL — AB (ref 70–99)
Glucose-Capillary: 221 mg/dL — ABNORMAL HIGH (ref 70–99)
Glucose-Capillary: 307 mg/dL — ABNORMAL HIGH (ref 70–99)

## 2014-03-13 MED ORDER — DEXTROSE 5 % IV SOLN
1.0000 g | Freq: Three times a day (TID) | INTRAVENOUS | Status: DC
  Administered 2014-03-13 – 2014-03-16 (×8): 1 g via INTRAVENOUS
  Filled 2014-03-13 (×11): qty 1

## 2014-03-13 NOTE — Consult Note (Signed)
[place] for Infectious Disease    Date of Admission:  03/09/2014   Total days of antibiotics 5              Reason for Consult: Bacteremic Escherichia coli pyelonephritis and chronic sacral decubitus ulcers    Referring Physician: Dr. Velvet Bathe  Principal Problem:   Ileus Active Problems:   UTI (lower urinary tract infection)   Diabetes mellitus   Decubitus ulcers   HTN (hypertension)   Leukocytosis, unspecified   Anemia   Hyponatremia   Dyslipidemia   . Chlorhexidine Gluconate Cloth  6 each Topical Q0600  . enoxaparin (LOVENOX) injection  40 mg Subcutaneous QHS  . insulin aspart  0-9 Units Subcutaneous TID WC  . latanoprost  1 drop Both Eyes QHS  . loratadine  10 mg Oral q morning - 10a  . mupirocin ointment  1 application Nasal BID  . pantoprazole  40 mg Oral Daily  . risperiDONE  1.5 mg Oral BID  . simvastatin  10 mg Oral QHS  . sodium chloride  3 mL Intravenous Q12H  . tiZANidine  2 mg Oral BID    Recommendations: 1. Start IV aztreonam and treated for 9 more days   Assessment: Her initial febrile illness was probably due to pyelonephritis complicated by transient Escherichia coli bacteremia. She probably has developed a beta-lactam allergy. Her isolate is resistant to fluoroquinolones and sulfa antibiotics. We'll treat her with IV aztreonam to complete 14 days of total therapy.  CT scans dating back to 2009 in our system show some changes of chronic osteomyelitis of the pelvis. I doubt that she has acute, active osteomyelitis at this time it would not treat her with a longer course of antibiotics.    HPI: Shannon Schmidt is a 73 y.o. 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 She developed sacral decubitus ulcers 10 years ago and was seen at the wound center at [facility]. She underwent diverting colostomy and skin grafts. She recalls being treated for long periods of time  with antibiotics. She had a Foley catheter in for many years but developed periurethral ulcers and difficulty keeping the catheter in. It was removed about one year ago when she was refused attempts to replace the catheter. She's not aware of any recent changes in the wounds.  She recently developed fever, chills and decreased urine output. Admission notes state that there was also foul-smelling urine. She was admitted with high fever. One of 2 blood cultures have grown Escherichia coli as has her urine culture. She is feeling much better and has defervesced. She was switched to ceftriaxone but developed a pruritic red rash last night and her antibiotic was stopped. She knows that she is allergic to some antibiotic in the past but does not know the name and there are no antibiotic allergies listed in EPIC.   Review of Systems: Pertinent items are noted in HPI.  Past Medical History  Diagnosis Date  . Osteomyelitis   . Schizophrenia   . Bipolar 1 disorder   . Muscle weakness   . Diabetes mellitus   . GERD (gastroesophageal reflux disease)   . PONV (postoperative nausea and vomiting)   . RA (rheumatoid arthritis)   . Colostomy in place   . COPD (chronic obstructive pulmonary disease)   . H/O hiatal hernia   . Depression   . Anemia   .  Paraplegia   . Neuropathy     History  Substance Use Topics  . Smoking status: Former Research scientist (life sciences)  . Smokeless tobacco: Not on file  . Alcohol Use: No    Family History  Problem Relation Age of Onset  . Hypertension Mother   . Diabetes type II Mother   . Stroke Father   . Cancer Other    Allergies  Allergen Reactions  . Ace Inhibitors Other (See Comments)    Unknown   . Chlorpromazine Other (See Comments)    Unknown  . Haloperidol Decanoate Other (See Comments)    Unknown    OBJECTIVE: Blood pressure 138/99, pulse 86, temperature 97.3 F (36.3 C), temperature source Oral, resp. rate 18, height 5\' 10"  (1.778 m), weight 85.2 kg (187 lb 13.3  oz), SpO2 100.00%. General: She is alert, talkative in no distress Skin: She has bilateral sacral ulcers. Exposed bone is noted the face of a less ischial ulcer or. There is no significant drainage currently or odor. She has healed scars from skin grafts from her right anterior thigh. Lungs: Clear Cor: Regular S1-S2 no murmurs Abdomen: Obese, soft and nontender with a left lower quadrant colostomy Joints and extremities: Healed incision over her left knee  Lab Results Lab Results  Component Value Date   WBC 9.2 03/12/2014   HGB 9.9* 03/12/2014   HCT 31.0* 03/12/2014   MCV 85.9 03/12/2014   PLT 246 03/12/2014    Lab Results  Component Value Date   CREATININE 0.83 03/12/2014   BUN 13 03/12/2014   NA 137 03/12/2014   K 3.5* 03/12/2014   CL 99 03/12/2014   CO2 25 03/12/2014    Lab Results  Component Value Date   ALT 7 03/10/2014   AST 11 03/10/2014   ALKPHOS 49 03/10/2014   BILITOT 0.4 03/10/2014     Microbiology: Recent Results (from the past 240 hour(s))  CULTURE, BLOOD (ROUTINE X 2)     Status: None   Collection Time    03/09/14  6:44 PM      Result Value Ref Range Status   Specimen Description BLOOD RIGHT ARM   Final   Special Requests     Final   Value: BOTTLES DRAWN AEROBIC AND ANAEROBIC 5 CC BOTH BOTTLES   Culture  Setup Time     Final   Value: 03/10/2014 02:26     Performed at [facility]   Culture     Final   Value:        BLOOD CULTURE RECEIVED NO GROWTH TO DATE CULTURE WILL BE HELD FOR 5 DAYS BEFORE ISSUING A FINAL NEGATIVE REPORT     Performed at [facility]   Report Status PENDING   Incomplete  CULTURE, BLOOD (ROUTINE X 2)     Status: None   Collection Time    03/09/14  6:56 PM      Result Value Ref Range Status   Specimen Description BLOOD LEFT ARM  5 ML IN [facility] BOTTLE   Final   Special Requests NONE   Final   Culture  Setup Time     Final   Value: 03/10/2014 02:27     Performed at [facility]   Culture     Final   Value: ESCHERICHIA  COLI     Note: Gram Stain Report Called to,Read Back By and Verified With: JENNIFER ZAPPIA AT 6:12 P.M. ON 03/10/14 [facility]     Performed at [facility]  Report Status 03/12/2014 FINAL   Final   Organism ID, Bacteria ESCHERICHIA COLI   Final  URINE CULTURE     Status: None   Collection Time    03/09/14  7:32 PM      Result Value Ref Range Status   Specimen Description URINE, CATHETERIZED   Final   Special Requests NONE   Final   Culture  Setup Time     Final   Value: 03/10/2014 03:34     Performed at [place]     Final   Value: >=100,000 COLONIES/ML     Performed at [facility]   Culture     Final   Value: ESCHERICHIA COLI     Performed at [facility]   Report Status 03/11/2014 FINAL   Final   Organism ID, Bacteria ESCHERICHIA COLI   Final  WOUND CULTURE     Status: None   Collection Time    03/10/14  2:30 AM      Result Value Ref Range Status   Specimen Description WOUND LEFT BUTTOCKS   Final   Special Requests Normal   Final   Gram Stain     Final   Value: FEW WBC PRESENT,BOTH PMN AND MONONUCLEAR     NO SQUAMOUS EPITHELIAL CELLS SEEN     RARE GRAM POSITIVE COCCI     IN PAIRS RARE GRAM POSITIVE RODS     Performed at [facility]   Culture     Final   Value: FEW GROUP B STREP(S.AGALACTIAE)ISOLATED     Note: TESTING AGAINST S. AGALACTIAE NOT ROUTINELY PERFORMED DUE TO PREDICTABILITY OF AMP/PEN/VAN SUSCEPTIBILITY.     Performed at [facility]   Report Status 03/12/2014 FINAL   Final  MRSA PCR SCREENING     Status: Abnormal   Collection Time    03/10/14  2:30 AM      Result Value Ref Range Status   MRSA by PCR POSITIVE (*) NEGATIVE Final   Comment:            The GeneXpert MRSA Assay (FDA     approved for NASAL specimens     only), is one component of a     comprehensive MRSA colonization     surveillance program. It is not     intended to diagnose MRSA     infection nor to guide or     monitor  treatment for     MRSA infections.     RESULT CALLED TO, READ BACK BY AND VERIFIED WITH:     SPOKE WITH LOCKWWOD,K RN [phone] Cindra Eves, [place] [facility] [phone] pager   [phone] cell 03/13/2014, 4:14 PM

## 2014-03-13 NOTE — Care Management Note (Signed)
    Page 1 of 1   03/13/2014     2:57:58 PM   CARE MANAGEMENT NOTE 03/13/2014  Patient:  Shannon Schmidt, Shannon Schmidt   Account Number:  [id number]  Date Initiated:  03/10/2014  Documentation initiated by:  [facility]  Subjective/Objective Assessment:   73 year old female admitted with SIRS and ileus.     Action/Plan:   From [facility].   Anticipated DC Date:  03/14/2014   Anticipated DC Plan:  SKILLED NURSING FACILITY  In-house referral  Clinical Social Worker      DC Planning Services  CM consult      Choice offered to / List presented to:             Status of service:  In process, will continue to follow Medicare Important Message given?  NA - LOS <3 / Initial given by admissions (If response is "NO", the following Medicare IM given date fields will be blank) Date Medicare IM given:   Date Additional Medicare IM given:    Discharge Disposition:    Per UR Regulation:  Reviewed for med. necessity/level of care/duration of stay  If discussed at [place] of Stay Meetings, dates discussed:    Comments:  03/13/14 [place]ana Walthall RN,BSN NCM [email or web address].ID FOLLOWING FOR ABX.D/C PLAN SNF.

## 2014-03-13 NOTE — Progress Notes (Signed)
[facility] PROGRESS NOTE  Shannon Schmidt YHC:[id number] DOB: 1941/11/03 DOA: 03/09/2014 PCP: Theressa Millard, MD  Assessment/Plan: Principal problem:  Sepsis secondary to E. Coli UTI, bacteremia  - On admission, patient had a fever of 102.8 F, RR 24, HR 128 and elevated white blood cell count  - Patient also had evidence of urinary tract infection on UA and has sacral decubitus ulcer with chronic osteomyelitis.  - pt initially started on Vancomycin and Rocephin, due to persistent fevers and prelim blood culture positive for g-rods.  E coli growing in urine culture. - Given chronic osteomyelitis and current E Coli infection will defer antibiotic selection to ID who has been consulted. Patient was switched yesterday to Rocephin but she developed itching overnight as such night team discontinued.  Active Problems:  UTI (lower urinary tract infection)  - urine culture positive for E. Coli and sensitivities reviewed. - ID to help with antibiotic selection.  Ileus  - Resolving - continue supportive therapy.  Diabetes mellitus  -SSI and carb modified diet. - Will continue to hold levemir.  Decubitus ulcers  - Consult requested for several pressure ulcers. Patient has multiple sites from previous ischemia grafts which have healed.  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 Moist gauze packing to left ischium per wound care  - Right inner buttock stage 3 wound 1x1x1cm. Right ischium with grey, macerated scar tissue. No open wound or drainage but scar has formed a deep valley 4x1x1cm, which is macerated from frequent moisture from incontinent urine. Moist gauze packing to left ischium per wound.  - Right second toe with full thickness wound 3x.3cm, 100% pink and covered with collagen which s not available at [facility] system so per wound care dressing can remain in place until she is  discharged to promote healing.  - Colostomy stable  - Mattress overlay order in place    Code Status: DNR Family Communication: discussed directly with patient. Disposition Plan: Pending improvement in condition and recommendations from ID   Consultants:  Wound care nurse  Procedures:  Dg Chest 1 View 03/09/2014 No acute findings. Mild scarring left lung base. Dg Hip Bilateral W/pelvis 2014-03-25 Pronounced osseous demineralization. Mild degenerative changes of both hip joints. Abnormal foci of soft tissue gas inferiora to the left inferior pubic ramus, of uncertain antral posterior position. Soft tissue gas inferior to left inferior pubic ramus without definite bone destruction. Anteroposterior position of the gas is uncertain, question related to a posterior ulcer, doubt related to superimposed ostomy bag. Consider CT imaging of the pelvis with IV contrast to assess for deep soft tissue infection/abscess and to exclude subtle bone destruction/ osteomyelitis. Dg Abd 1 View 03/09/2014 Mild prominence of colon and small bowel likely reflecting ileus.    Antibiotics: Rocephin 4/15 Vancomycin 03/09/2014--> 4/15 Zosyn 2023-03-26 --> 4/15  HPI/Subjective: No new complaints. No acute issues overnight reported to me.  Objective: Filed Vitals:   03/13/14 0642  BP: 137/61  Pulse: 83  Temp: 98.3 F (36.8 C)  Resp: 18    Intake/Output Summary (Last 24 hours) at 03/13/14 1348 Last data filed at 03/13/14 0824  Gross per 24 hour  Intake    650 ml  Output      0 ml  Net    650 ml   Filed Weights   03/09/14 2300  Weight: 85.2 kg (187 lb 13.3 oz)    Exam:  General:  Pt in NAD, alert and awake  Cardiovascular: RRR, no MRG  Respiratory: CTA BL, no wheezes  Abdomen: soft, + suprapubic discomfort, ND  Data Reviewed: Basic Metabolic Panel:  Recent Labs Lab 03/09/14 1847 03/10/14 0412 03/12/14 0356  NA 133* 135* 137  K 3.8 3.8 3.5*  CL 95* 99 99  CO2 25 26 25   GLUCOSE 194*  141* 196*  BUN 17 16 13   CREATININE 0.65 0.68 0.83  CALCIUM 9.2 8.7 8.6  MG  --  2.0  --   PHOS  --  3.1  --    Liver Function Tests:  Recent Labs Lab 03/09/14 1847 03/10/14 0412  AST 12 11  ALT 7 7  ALKPHOS 54 49  BILITOT 0.4 0.4  PROT 8.2 7.5  ALBUMIN 2.9* 2.7*   No results found for this basename: LIPASE, AMYLASE,  in the last 168 hours No results found for this basename: AMMONIA,  in the last 168 hours CBC:  Recent Labs Lab 03/09/14 1847 03/10/14 0412 03/12/14 0356  WBC 11.5* 11.4* 9.2  NEUTROABS 9.7*  --   --   HGB 11.1* 10.4* 9.9*  HCT 34.0* 32.6* 31.0*  MCV 87.6 88.3 85.9  PLT 225 207 246   Cardiac Enzymes: No results found for this basename: CKTOTAL, CKMB, CKMBINDEX, TROPONINI,  in the last 168 hours BNP (last 3 results) No results found for this basename: PROBNP,  in the last 8760 hours CBG:  Recent Labs Lab 03/12/14 0746 03/12/14 1145 03/12/14 1623 03/12/14 2042 03/13/14 1157  GLUCAP 166* 249* 255* 281* 307*    Recent Results (from the past 240 hour(s))  CULTURE, BLOOD (ROUTINE X 2)     Status: None   Collection Time    03/09/14  6:44 PM      Result Value Ref Range Status   Specimen Description BLOOD RIGHT ARM   Final   Special Requests     Final   Value: BOTTLES DRAWN AEROBIC AND ANAEROBIC 5 CC BOTH BOTTLES   Culture  Setup Time     Final   Value: 03/10/2014 02:26     Performed at [facility]   Culture     Final   Value:        BLOOD CULTURE RECEIVED NO GROWTH TO DATE CULTURE WILL BE HELD FOR 5 DAYS BEFORE ISSUING A FINAL NEGATIVE REPORT     Performed at [facility]   Report Status PENDING   Incomplete  CULTURE, BLOOD (ROUTINE X 2)     Status: None   Collection Time    03/09/14  6:56 PM      Result Value Ref Range Status   Specimen Description BLOOD LEFT ARM  5 ML IN [facility] BOTTLE   Final   Special Requests NONE   Final   Culture  Setup Time     Final   Value: 03/10/2014 02:27     Performed at [facility]    Culture     Final   Value: ESCHERICHIA COLI     Note: Gram Stain Report Called to,Read Back By and Verified With: JENNIFER ZAPPIA AT 6:12 P.M. ON 03/10/14 WARRB     Performed at [facility]   Report Status 03/12/2014 FINAL   Final   Organism ID, Bacteria ESCHERICHIA COLI   Final  URINE CULTURE     Status: None   Collection Time    03/09/14  7:32 PM      Result Value Ref  Range Status   Specimen Description URINE, CATHETERIZED   Final   Special Requests NONE   Final   Culture  Setup Time     Final   Value: 03/10/2014 03:34     Performed at [facility] Count     Final   Value: >=100,000 COLONIES/ML     Performed at [facility]   Culture     Final   Value: ESCHERICHIA COLI     Performed at [facility]   Report Status 03/11/2014 FINAL   Final   Organism ID, Bacteria ESCHERICHIA COLI   Final  WOUND CULTURE     Status: None   Collection Time    03/10/14  2:30 AM      Result Value Ref Range Status   Specimen Description WOUND LEFT BUTTOCKS   Final   Special Requests Normal   Final   Gram Stain     Final   Value: FEW WBC PRESENT,BOTH PMN AND MONONUCLEAR     NO SQUAMOUS EPITHELIAL CELLS SEEN     RARE GRAM POSITIVE COCCI     IN PAIRS RARE GRAM POSITIVE RODS     Performed at [facility]   Culture     Final   Value: FEW GROUP B STREP(S.AGALACTIAE)ISOLATED     Note: TESTING AGAINST S. AGALACTIAE NOT ROUTINELY PERFORMED DUE TO PREDICTABILITY OF AMP/PEN/VAN SUSCEPTIBILITY.     Performed at [facility]   Report Status 03/12/2014 FINAL   Final  MRSA PCR SCREENING     Status: Abnormal   Collection Time    03/10/14  2:30 AM      Result Value Ref Range Status   MRSA by PCR POSITIVE (*) NEGATIVE Final   Comment:            The GeneXpert MRSA Assay (FDA     approved for NASAL specimens     only), is one component of a     comprehensive MRSA colonization     surveillance program. It is not     intended to diagnose MRSA      infection nor to guide or     monitor treatment for     MRSA infections.     RESULT CALLED TO, READ BACK BY AND VERIFIED WITH:     SPOKE WITH LOCKWWOD,K RN [phone] [place],N      Studies: Ct Pelvis W Contrast  03/11/2014   CLINICAL DATA:  Pelvic pain. Fever. Query abscess or osteomyelitis.  EXAM: CT PELVIS WITH CONTRAST  TECHNIQUE: Multidetector CT imaging of the pelvis was performed using the standard protocol following the bolus administration of intravenous contrast.  CONTRAST:  OMNIPAQUE IOHEXOL 300 MG/ML  SOLN  COMPARISON:  DG HIP BILATERAL W/PELVIS dated 03/10/2014; CT ABD/PELVIS W CM dated 12/19/2009  FINDINGS: Aortoiliac atherosclerotic vascular disease noted.  Left colostomy.  Urinary bladder unremarkable.  Large bilateral decubitus ulcers extend to the bony surface of the ischial tuberosities which demonstrate sclerosis and spurring. The sclerosis is quite likely an indicator of chronic osteomyelitis given the exposure of the bone to the ulcerative wounds.  Decubitus ulcer extends towards the right greater trochanter which demonstrates flattening and extensive spurring. No definite bony destructive findings to suggest current osteomyelitis of the right greater trochanter. There appears to be some separation of the ulceration from the bony margin of the greater trochanter by a granulation tissue.  A smaller ulceration overlies the left greater trochanter but is separated  from the trochanter by muscle and subcutaneous tissues.  IMPRESSION: 1. Bilateral decubitus ulcers extending to both ischial tuberosities, with sclerosis at the ischial tuberosities favoring chronic osteomyelitis. 2. Deep decubitus ulcer extending towards but not definitively reaching the right greater trochanter. There is underlying trochanteric deformity on the right but I doubt that there is active osteomyelitis involving the right greater trochanter. 3. Smaller superficial decubitus ulcer overlying the left greater  trochanter.   Electronically Signed   By: Sherryl Barters M.D.   On: 03/11/2014 21:21    Scheduled Meds: . Chlorhexidine Gluconate Cloth  6 each Topical Q0600  . enoxaparin (LOVENOX) injection  40 mg Subcutaneous QHS  . insulin aspart  0-9 Units Subcutaneous TID WC  . latanoprost  1 drop Both Eyes QHS  . loratadine  10 mg Oral q morning - 10a  . mupirocin ointment  1 application Nasal BID  . pantoprazole  40 mg Oral Daily  . risperiDONE  1.5 mg Oral BID  . simvastatin  10 mg Oral QHS  . sodium chloride  3 mL Intravenous Q12H  . tiZANidine  2 mg Oral BID   Continuous Infusions:    Time spent: > 35 minutes    [facility] Pager [phone]. If 7PM-7AM, please contact night-coverage at [email or web address], password [facility] 03/13/2014, 1:48 PM  LOS: 4 days

## 2014-03-14 DIAGNOSIS — R7881 Bacteremia: Secondary | ICD-10-CM

## 2014-03-14 LAB — GLUCOSE, CAPILLARY
GLUCOSE-CAPILLARY: 301 mg/dL — AB (ref 70–99)
Glucose-Capillary: 220 mg/dL — ABNORMAL HIGH (ref 70–99)
Glucose-Capillary: 278 mg/dL — ABNORMAL HIGH (ref 70–99)
Glucose-Capillary: 270 mg/dL — ABNORMAL HIGH (ref 70–99)

## 2014-03-14 NOTE — Progress Notes (Signed)
Patient had eyes closed and was laying in bed upon entering room.  Patient awoken with a touch on the arm.  Patient is normocephalic with no lesions on head.  Patient ears are in line with lateral canvas; bend with no pain; no lesions present.  Eyes react and accommodate to light; sclera color normal for race; conjunctiva is moist and pink.  Nose is midline with no deviated septum; mucous membranes are pink and moist.  Heart rate and rhythm are within normal limits.  Lung sounds are clear and diminished; no adventitious sounds heard.  Bowel sounds are active.  Patient skin is dry, flaky and non-intact; has decubitus ulcer in ischium and buttocks.  Pulses felt in all extremities; 2+. Patient is comfortable at the time and does not need anything else.  Shelbie Hutching, SN  Filed Vitals:   03/14/14 0858  BP: 143/59  Pulse: 96  Temp: 98.4 F (36.9 C)  Resp: 18

## 2014-03-14 NOTE — Progress Notes (Signed)
Patient ID: Shannon Schmidt, female   DOB: 1941/04/16, 73 y.o.   MRN: [id number]         [facility]    Date of Admission:  03/09/2014   Total days of antibiotics 6  Patient Active Problem List   Diagnosis Date Noted  . Pyelonephritis due to Escherichia coli 03/09/2014    Priority: High  . HTN (hypertension) 03/10/2014  . Leukocytosis, unspecified 03/10/2014  . Anemia 03/10/2014  . Hyponatremia 03/10/2014  . Dyslipidemia 03/10/2014  . Ileus 03/09/2014  . Diabetes mellitus 03/09/2014  . Decubitus ulcers 03/09/2014    . aztreonam  1 g Intravenous Q8H  . Chlorhexidine Gluconate Cloth  6 each Topical Q0600  . enoxaparin (LOVENOX) injection  40 mg Subcutaneous QHS  . insulin aspart  0-9 Units Subcutaneous TID WC  . latanoprost  1 drop Both Eyes QHS  . loratadine  10 mg Oral q morning - 10a  . mupirocin ointment  1 application Nasal BID  . pantoprazole  40 mg Oral Daily  . risperiDONE  1.5 mg Oral BID  . simvastatin  10 mg Oral QHS  . sodium chloride  3 mL Intravenous Q12H  . tiZANidine  2 mg Oral BID    Subjective: She is feeling much better. She has not had any further chills. She has no dysuria.  Objective: Temp:  [98.4 F (36.9 C)-99 F (37.2 C)] 98.4 F (36.9 C) (04/17 0858) Pulse Rate:  [82-96] 96 (04/17 0858) Resp:  [18-24] 18 (04/17 0858) BP: (119-146)/(55-59) 143/59 mmHg (04/17 0858) SpO2:  [96 %-97 %] 96 % (04/17 0858)  General:  Alert, smiling and in no distress. She is eating lunch and watching television. Skin: No acute rash Lungs: Clear Cor: Regular S1 and S2 no murmurs Abdomen: Soft. Colostomy appears normal Sacral decubiti bandaged  Lab Results Lab Results  Component Value Date   WBC 9.2 03/12/2014   HGB 9.9* 03/12/2014   HCT 31.0* 03/12/2014   MCV 85.9 03/12/2014   PLT 246 03/12/2014    Lab Results  Component Value Date   CREATININE 0.83 03/12/2014   BUN 13 03/12/2014   NA 137 03/12/2014   K 3.5* 03/12/2014   CL 99  03/12/2014   CO2 25 03/12/2014    Lab Results  Component Value Date   ALT 7 03/10/2014   AST 11 03/10/2014   ALKPHOS 49 03/10/2014   BILITOT 0.4 03/10/2014      Microbiology: Recent Results (from the past 240 hour(s))  CULTURE, BLOOD (ROUTINE X 2)     Status: None   Collection Time    03/09/14  6:44 PM      Result Value Ref Range Status   Specimen Description BLOOD RIGHT ARM   Final   Special Requests     Final   Value: BOTTLES DRAWN AEROBIC AND ANAEROBIC 5 CC BOTH BOTTLES   Culture  Setup Time     Final   Value: 03/10/2014 02:26     Performed at [facility]   Culture     Final   Value:        BLOOD CULTURE RECEIVED NO GROWTH TO DATE CULTURE WILL BE HELD FOR 5 DAYS BEFORE ISSUING A FINAL NEGATIVE REPORT     Performed at [facility]   Report Status PENDING   Incomplete  CULTURE, BLOOD (ROUTINE X 2)     Status: None   Collection Time    03/09/14  6:56 PM  Result Value Ref Range Status   Specimen Description BLOOD LEFT ARM  5 ML IN [facility] BOTTLE   Final   Special Requests NONE   Final   Culture  Setup Time     Final   Value: 03/10/2014 02:27     Performed at [facility]   Culture     Final   Value: ESCHERICHIA COLI     Note: Gram Stain Report Called to,Read Back By and Verified With: JENNIFER ZAPPIA AT 6:12 P.M. ON 03/10/14 WARRB     Performed at [facility]   Report Status 03/12/2014 FINAL   Final   Organism ID, Bacteria ESCHERICHIA COLI   Final  URINE CULTURE     Status: None   Collection Time    03/09/14  7:32 PM      Result Value Ref Range Status   Specimen Description URINE, CATHETERIZED   Final   Special Requests NONE   Final   Culture  Setup Time     Final   Value: 03/10/2014 03:34     Performed at [place]     Final   Value: >=100,000 COLONIES/ML     Performed at [facility]   Culture     Final   Value: ESCHERICHIA COLI     Performed at [facility]   Report Status 03/11/2014 FINAL    Final   Organism ID, Bacteria ESCHERICHIA COLI   Final  WOUND CULTURE     Status: None   Collection Time    03/10/14  2:30 AM      Result Value Ref Range Status   Specimen Description WOUND LEFT BUTTOCKS   Final   Special Requests Normal   Final   Gram Stain     Final   Value: FEW WBC PRESENT,BOTH PMN AND MONONUCLEAR     NO SQUAMOUS EPITHELIAL CELLS SEEN     RARE GRAM POSITIVE COCCI     IN PAIRS RARE GRAM POSITIVE RODS     Performed at [facility]   Culture     Final   Value: FEW GROUP B STREP(S.AGALACTIAE)ISOLATED     Note: TESTING AGAINST S. AGALACTIAE NOT ROUTINELY PERFORMED DUE TO PREDICTABILITY OF AMP/PEN/VAN SUSCEPTIBILITY.     Performed at [facility]   Report Status 03/12/2014 FINAL   Final  MRSA PCR SCREENING     Status: Abnormal   Collection Time    03/10/14  2:30 AM      Result Value Ref Range Status   MRSA by PCR POSITIVE (*) NEGATIVE Final   Comment:            The GeneXpert MRSA Assay (FDA     approved for NASAL specimens     only), is one component of a     comprehensive MRSA colonization     surveillance program. It is not     intended to diagnose MRSA     infection nor to guide or     monitor treatment for     MRSA infections.     RESULT CALLED TO, READ BACK BY AND VERIFIED WITH:     SPOKE WITH LOCKWWOD,K RN [phone] [place],N     Assessment: She had Escherichia coli pyelonephritis with transient bacteremia. I will complete therapy with a more days of IV aztreonam.  Plan: 1. Aztreonam for 8 more days after PICC placement 2. I will sign off now but please call if  I can be of further assistance while she is here  Michel Bickers, MD [facility] [phone] pager   [phone] cell 03/14/2014, 2:02 PM

## 2014-03-14 NOTE — Progress Notes (Signed)
[facility] PROGRESS NOTE  Shannon Schmidt PYP:[id number] DOB: [birth year] DOA: 03/09/2014 PCP: Theressa Millard, MD  Assessment/Plan: Principal problem:  Sepsis secondary to E. Coli UTI, bacteremia  - On admission, patient had a fever of 102.8 F, RR 24, HR 128 and elevated white blood cell count  - Patient also had evidence of urinary tract infection on UA and has sacral decubitus ulcer with chronic osteomyelitis.  - pt initially started on Vancomycin and Rocephin, consulted ID who recommended Aztreonam for 9 more days  Active Problems:  UTI (lower urinary tract infection)  - ID recommending aztreonam.   Ileus  - Resolving - continue supportive therapy.  Diabetes mellitus  -SSI and carb modified diet. - Will continue to hold levemir.  Decubitus ulcers  - Consult requested for several pressure ulcers. Patient has multiple sites from previous ischemia grafts which have healed.  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 Moist gauze packing to left ischium per wound care  - Right inner buttock stage 3 wound 1x1x1cm. Right ischium with grey, macerated scar tissue. No open wound or drainage but scar has formed a deep valley 4x1x1cm, which is macerated from frequent moisture from incontinent urine. Moist gauze packing to left ischium per wound.  - Right second toe with full thickness wound 3x.3cm, 100% pink and covered with collagen which s not available at [facility] system so per wound care dressing can remain in place until she is discharged to promote healing.  - Colostomy stable  - Mattress overlay order in place    Code Status: DNR Family Communication: discussed directly with patient. Disposition Plan: Picc line and discharge most likely next am if patient condition continues to improve.   Consultants:  Wound care nurse  Procedures:  Dg Chest 1 View 03/09/2014 No  acute findings. Mild scarring left lung base. Dg Hip Bilateral W/pelvis 03/31/14 Pronounced osseous demineralization. Mild degenerative changes of both hip joints. Abnormal foci of soft tissue gas inferiora to the left inferior pubic ramus, of uncertain antral posterior position. Soft tissue gas inferior to left inferior pubic ramus without definite bone destruction. Anteroposterior position of the gas is uncertain, question related to a posterior ulcer, doubt related to superimposed ostomy bag. Consider CT imaging of the pelvis with IV contrast to assess for deep soft tissue infection/abscess and to exclude subtle bone destruction/ osteomyelitis. Dg Abd 1 View 03/09/2014 Mild prominence of colon and small bowel likely reflecting ileus.    Antibiotics: Rocephin 4/15 Vancomycin 03/09/2014--> 4/15 Zosyn Apr 01, 2023 --> 4/15  HPI/Subjective: No new complaints. No acute issues overnight reported to me.  Objective: Filed Vitals:   03/14/14 0858  BP: 143/59  Pulse: 96  Temp: 98.4 F (36.9 C)  Resp: 18    Intake/Output Summary (Last 24 hours) at 03/14/14 1240 Last data filed at 03/14/14 1041  Gross per 24 hour  Intake    700 ml  Output    400 ml  Net    300 ml   Filed Weights   03/09/14 2300  Weight: 85.2 kg (187 lb 13.3 oz)    Exam:   General:  Pt in NAD, alert and awake  Cardiovascular: RRR, no MRG  Respiratory: CTA BL, no wheezes  Abdomen: soft, + suprapubic discomfort, ND  Data Reviewed: Basic Metabolic Panel:  Recent Labs Lab 03/09/14 1847 03/31/2014 0412 03/12/14 0356  NA 133* 135* 137  K 3.8  3.8 3.5*  CL 95* 99 99  CO2 25 26 25   GLUCOSE 194* 141* 196*  BUN 17 16 13   CREATININE 0.65 0.68 0.83  CALCIUM 9.2 8.7 8.6  MG  --  2.0  --   PHOS  --  3.1  --    Liver Function Tests:  Recent Labs Lab 03/09/14 1847 03/10/14 0412  AST 12 11  ALT 7 7  ALKPHOS 54 49  BILITOT 0.4 0.4  PROT 8.2 7.5  ALBUMIN 2.9* 2.7*   No results found for this basename: LIPASE,  AMYLASE,  in the last 168 hours No results found for this basename: AMMONIA,  in the last 168 hours CBC:  Recent Labs Lab 03/09/14 1847 03/10/14 0412 03/12/14 0356  WBC 11.5* 11.4* 9.2  NEUTROABS 9.7*  --   --   HGB 11.1* 10.4* 9.9*  HCT 34.0* 32.6* 31.0*  MCV 87.6 88.3 85.9  PLT 225 207 246   Cardiac Enzymes: No results found for this basename: CKTOTAL, CKMB, CKMBINDEX, TROPONINI,  in the last 168 hours BNP (last 3 results) No results found for this basename: PROBNP,  in the last 8760 hours CBG:  Recent Labs Lab 03/13/14 1157 03/13/14 1728 03/13/14 2231 03/14/14 0735 03/14/14 1222  GLUCAP 307* 157* 215* 220* 301*    Recent Results (from the past 240 hour(s))  CULTURE, BLOOD (ROUTINE X 2)     Status: None   Collection Time    03/09/14  6:44 PM      Result Value Ref Range Status   Specimen Description BLOOD RIGHT ARM   Final   Special Requests     Final   Value: BOTTLES DRAWN AEROBIC AND ANAEROBIC 5 CC BOTH BOTTLES   Culture  Setup Time     Final   Value: 03/10/2014 02:26     Performed at [facility]   Culture     Final   Value:        BLOOD CULTURE RECEIVED NO GROWTH TO DATE CULTURE WILL BE HELD FOR 5 DAYS BEFORE ISSUING A FINAL NEGATIVE REPORT     Performed at [facility]   Report Status PENDING   Incomplete  CULTURE, BLOOD (ROUTINE X 2)     Status: None   Collection Time    03/09/14  6:56 PM      Result Value Ref Range Status   Specimen Description BLOOD LEFT ARM  5 ML IN [facility] BOTTLE   Final   Special Requests NONE   Final   Culture  Setup Time     Final   Value: 03/10/2014 02:27     Performed at [facility]   Culture     Final   Value: ESCHERICHIA COLI     Note: Gram Stain Report Called to,Read Back By and Verified With: JENNIFER ZAPPIA AT 6:12 P.M. ON 03/10/14 WARRB     Performed at [facility]   Report Status 03/12/2014 FINAL   Final   Organism ID, Bacteria ESCHERICHIA COLI   Final  URINE CULTURE     Status: None    Collection Time    03/09/14  7:32 PM      Result Value Ref Range Status   Specimen Description URINE, CATHETERIZED   Final   Special Requests NONE   Final   Culture  Setup Time     Final   Value: 03/10/2014 03:34     Performed at [facility]  Final   Value: >=100,000 COLONIES/ML     Performed at [facility]   Culture     Final   Value: ESCHERICHIA COLI     Performed at [facility]   Report Status 03/11/2014 FINAL   Final   Organism ID, Bacteria ESCHERICHIA COLI   Final  WOUND CULTURE     Status: None   Collection Time    03/10/14  2:30 AM      Result Value Ref Range Status   Specimen Description WOUND LEFT BUTTOCKS   Final   Special Requests Normal   Final   Gram Stain     Final   Value: FEW WBC PRESENT,BOTH PMN AND MONONUCLEAR     NO SQUAMOUS EPITHELIAL CELLS SEEN     RARE GRAM POSITIVE COCCI     IN PAIRS RARE GRAM POSITIVE RODS     Performed at [facility]   Culture     Final   Value: FEW GROUP B STREP(S.AGALACTIAE)ISOLATED     Note: TESTING AGAINST S. AGALACTIAE NOT ROUTINELY PERFORMED DUE TO PREDICTABILITY OF AMP/PEN/VAN SUSCEPTIBILITY.     Performed at [facility]   Report Status 03/12/2014 FINAL   Final  MRSA PCR SCREENING     Status: Abnormal   Collection Time    03/10/14  2:30 AM      Result Value Ref Range Status   MRSA by PCR POSITIVE (*) NEGATIVE Final   Comment:            The GeneXpert MRSA Assay (FDA     approved for NASAL specimens     only), is one component of a     comprehensive MRSA colonization     surveillance program. It is not     intended to diagnose MRSA     infection nor to guide or     monitor treatment for     MRSA infections.     RESULT CALLED TO, READ BACK BY AND VERIFIED WITH:     SPOKE WITH LOCKWWOD,K RN [phone] COVINGTON,N      Studies: No results found.  Scheduled Meds: . aztreonam  1 g Intravenous Q8H  . Chlorhexidine Gluconate Cloth  6 each Topical Q0600  .  enoxaparin (LOVENOX) injection  40 mg Subcutaneous QHS  . insulin aspart  0-9 Units Subcutaneous TID WC  . latanoprost  1 drop Both Eyes QHS  . loratadine  10 mg Oral q morning - 10a  . mupirocin ointment  1 application Nasal BID  . pantoprazole  40 mg Oral Daily  . risperiDONE  1.5 mg Oral BID  . simvastatin  10 mg Oral QHS  . sodium chloride  3 mL Intravenous Q12H  . tiZANidine  2 mg Oral BID   Continuous Infusions:    Time spent: > 35 minutes    [facility] Pager [phone]. If 7PM-7AM, please contact night-coverage at [email or web address], password [facility] 03/14/2014, 12:40 PM  LOS: 5 days

## 2014-03-14 NOTE — Progress Notes (Signed)
Patient laying in bed watching TV upon entering room.  Alert and orientated to person, place, and time.  Heart rate and rhythm within normal limits.  Bowel sounds hyperactive upon auscultation.  Lungs sound clear and diminished.  Patient comfortable and does not need anything else at the moment.  Shelbie Hutching, SN  Filed Vitals:   03/14/14 1426  BP: 135/59  Pulse: 92  Temp:   Resp: 19

## 2014-03-14 NOTE — Progress Notes (Signed)
CSW anticipating discharge tomorrow, Sat - 4/18 back to [facility] after PICC line is placed. Weekend CSW, Estill Bamberg to facilitate discharge.   Raynaldo Opitz, [facility] Clinical Social Worker cell #: [phone]

## 2014-03-14 NOTE — Progress Notes (Signed)
Peripherally Inserted Central Catheter/Midline Placement  The IV Nurse has discussed with the patient and/or persons authorized to consent for the patient, the purpose of this procedure and the potential benefits and risks involved with this procedure.  The benefits include less needle sticks, lab draws from the catheter and patient may be discharged home with the catheter.  Risks include, but not limited to, infection, bleeding, blood clot (thrombus formation), and puncture of an artery; nerve damage and irregular heat beat.  Alternatives to this procedure were also discussed.  PICC/Midline Placement Documentation        Henderson [place] 03/14/2014, 2:27 PM

## 2014-03-15 LAB — GLUCOSE, CAPILLARY
Glucose-Capillary: 256 mg/dL — ABNORMAL HIGH (ref 70–99)
Glucose-Capillary: 356 mg/dL — ABNORMAL HIGH (ref 70–99)
Glucose-Capillary: 231 mg/dL — ABNORMAL HIGH (ref 70–99)
Glucose-Capillary: 229 mg/dL — ABNORMAL HIGH (ref 70–99)

## 2014-03-15 MED ORDER — METOPROLOL TARTRATE 12.5 MG HALF TABLET
12.5000 mg | ORAL_TABLET | Freq: Two times a day (BID) | ORAL | Status: DC
  Administered 2014-03-15 – 2014-03-16 (×3): 12.5 mg via ORAL
  Filled 2014-03-15 (×4): qty 1

## 2014-03-15 MED ORDER — INSULIN DETEMIR 100 UNIT/ML ~~LOC~~ SOLN
25.0000 [IU] | Freq: Two times a day (BID) | SUBCUTANEOUS | Status: DC
Start: 2014-03-15 — End: 2014-03-16
  Administered 2014-03-15 – 2014-03-16 (×3): 25 [IU] via SUBCUTANEOUS
  Filled 2014-03-15 (×4): qty 0.25

## 2014-03-15 MED ORDER — INSULIN ASPART 100 UNIT/ML ~~LOC~~ SOLN
0.0000 [IU] | Freq: Three times a day (TID) | SUBCUTANEOUS | Status: DC
  Administered 2014-03-15: 15 [IU] via SUBCUTANEOUS
  Administered 2014-03-15: 5 [IU] via SUBCUTANEOUS

## 2014-03-15 MED ORDER — SODIUM CHLORIDE 0.9 % IJ SOLN
10.0000 mL | INTRAMUSCULAR | Status: DC | PRN
  Administered 2014-03-16: 10 mL

## 2014-03-15 NOTE — Progress Notes (Addendum)
[facility] PROGRESS NOTE  ANALISE GLOTFELTY ZSW:[id number] DOB: 23-Feb-1941 DOA: 03/09/2014 PCP: Theressa Millard, MD  Assessment/Plan: Principal problem:  Sepsis secondary to E. Coli UTI, bacteremia  - On admission, patient had a fever of 102.8 F, RR 24, HR 128 and elevated white blood cell count  - Patient also had evidence of urinary tract infection on UA and has sacral decubitus ulcer with chronic osteomyelitis.  - pt initially started on Vancomycin and Rocephin, consulted ID who recommended Aztreonam for 8 more days  Active Problems:  UTI (lower urinary tract infection)  - ID recommending aztreonam.   Ileus  - Resolving - continue supportive therapy.  Diabetes mellitus  -SSI and carb modified diet. - Addendum: given elevated blood sugar will plan on adding levemir.  Decubitus ulcers  - Consult requested for several pressure ulcers. Patient has multiple sites from previous ischemia grafts which have healed.  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 Moist gauze packing to left ischium per wound care  - Right inner buttock stage 3 wound 1x1x1cm. Right ischium with grey, macerated scar tissue. No open wound or drainage but scar has formed a deep valley 4x1x1cm, which is macerated from frequent moisture from incontinent urine. Moist gauze packing to left ischium per wound.  - Right second toe with full thickness wound 3x.3cm, 100% pink and covered with collagen which s not available at [facility] system so per wound care dressing can remain in place until she is discharged to promote healing.  - Colostomy stable  - Mattress overlay order in place   HTN - Will add low dose metoprolol - if blood pressures better controlled next am will plan on discharging next am.   Code Status: DNR Family Communication: discussed directly with patient. Disposition Plan: With improved  blood pressures   Consultants:  Wound care nurse  Procedures:  Dg Chest 1 View 03/09/2014 No acute findings. Mild scarring left lung base. Dg Hip Bilateral W/pelvis Mar 23, 2014 Pronounced osseous demineralization. Mild degenerative changes of both hip joints. Abnormal foci of soft tissue gas inferiora to the left inferior pubic ramus, of uncertain antral posterior position. Soft tissue gas inferior to left inferior pubic ramus without definite bone destruction. Anteroposterior position of the gas is uncertain, question related to a posterior ulcer, doubt related to superimposed ostomy bag. Consider CT imaging of the pelvis with IV contrast to assess for deep soft tissue infection/abscess and to exclude subtle bone destruction/ osteomyelitis. Dg Abd 1 View 03/09/2014 Mild prominence of colon and small bowel likely reflecting ileus.    Antibiotics: Rocephin 4/15 Vancomycin 03/09/2014--> 4/15 Zosyn Mar 24, 2023 --> 4/15  HPI/Subjective: No acute issues reported overnight.  No new complaints.  This morning nurse paged me indicating that patient's blood pressures were elevated at 185/72 despite prn blood pressure medications.  Objective: Filed Vitals:   03/15/14 0945  BP: 167/69  Pulse: 101  Temp:   Resp:     Intake/Output Summary (Last 24 hours) at 03/15/14 1046 Last data filed at 03/14/14 1811  Gross per 24 hour  Intake     50 ml  Output      0 ml  Net     50 ml   Filed Weights   03/09/14 2300  Weight: 85.2 kg (187 lb 13.3 oz)    Exam:   General:  Pt in NAD, alert and awake  Cardiovascular: RRR, no MRG  Respiratory: CTA BL, no wheezes  Abdomen: soft, + suprapubic discomfort, ND  Data Reviewed: Basic Metabolic Panel:  Recent Labs Lab 03/09/14 1847 03/10/14 0412 03/12/14 0356  NA 133* 135* 137  K 3.8 3.8 3.5*  CL 95* 99 99  CO2 25 26 25   GLUCOSE 194* 141* 196*  BUN 17 16 13   CREATININE 0.65 0.68 0.83  CALCIUM 9.2 8.7 8.6  MG  --  2.0  --   PHOS  --  3.1  --     Liver Function Tests:  Recent Labs Lab 03/09/14 1847 03/10/14 0412  AST 12 11  ALT 7 7  ALKPHOS 54 49  BILITOT 0.4 0.4  PROT 8.2 7.5  ALBUMIN 2.9* 2.7*   No results found for this basename: LIPASE, AMYLASE,  in the last 168 hours No results found for this basename: AMMONIA,  in the last 168 hours CBC:  Recent Labs Lab 03/09/14 1847 03/10/14 0412 03/12/14 0356  WBC 11.5* 11.4* 9.2  NEUTROABS 9.7*  --   --   HGB 11.1* 10.4* 9.9*  HCT 34.0* 32.6* 31.0*  MCV 87.6 88.3 85.9  PLT 225 207 246   Cardiac Enzymes: No results found for this basename: CKTOTAL, CKMB, CKMBINDEX, TROPONINI,  in the last 168 hours BNP (last 3 results) No results found for this basename: PROBNP,  in the last 8760 hours CBG:  Recent Labs Lab 03/14/14 0735 03/14/14 1222 03/14/14 1749 03/14/14 2202 03/15/14 0736  GLUCAP 220* 301* 278* 270* 256*    Recent Results (from the past 240 hour(s))  CULTURE, BLOOD (ROUTINE X 2)     Status: None   Collection Time    03/09/14  6:44 PM      Result Value Ref Range Status   Specimen Description BLOOD RIGHT ARM   Final   Special Requests     Final   Value: BOTTLES DRAWN AEROBIC AND ANAEROBIC 5 CC BOTH BOTTLES   Culture  Setup Time     Final   Value: 03/10/2014 02:26     Performed at [facility]   Culture     Final   Value:        BLOOD CULTURE RECEIVED NO GROWTH TO DATE CULTURE WILL BE HELD FOR 5 DAYS BEFORE ISSUING A FINAL NEGATIVE REPORT     Performed at [facility]   Report Status PENDING   Incomplete  CULTURE, BLOOD (ROUTINE X 2)     Status: None   Collection Time    03/09/14  6:56 PM      Result Value Ref Range Status   Specimen Description BLOOD LEFT ARM  5 ML IN [facility] BOTTLE   Final   Special Requests NONE   Final   Culture  Setup Time     Final   Value: 03/10/2014 02:27     Performed at [facility]   Culture     Final   Value: ESCHERICHIA COLI     Note: Gram Stain Report Called to,Read Back By and Verified  With: JENNIFER ZAPPIA AT 6:12 P.M. ON 03/10/14 WARRB     Performed at [facility]   Report Status 03/12/2014 FINAL   Final   Organism ID, Bacteria ESCHERICHIA COLI   Final  URINE CULTURE     Status: None   Collection Time    03/09/14  7:32 PM      Result Value Ref Range Status   Specimen Description URINE, CATHETERIZED   Final   Special  Requests NONE   Final   Culture  Setup Time     Final   Value: 03/10/2014 03:34     Performed at [facility]     Final   Value: >=100,000 COLONIES/ML     Performed at [facility]   Culture     Final   Value: ESCHERICHIA COLI     Performed at [facility]   Report Status 03/11/2014 FINAL   Final   Organism ID, Bacteria ESCHERICHIA COLI   Final  WOUND CULTURE     Status: None   Collection Time    03/10/14  2:30 AM      Result Value Ref Range Status   Specimen Description WOUND LEFT BUTTOCKS   Final   Special Requests Normal   Final   Gram Stain     Final   Value: FEW WBC PRESENT,BOTH PMN AND MONONUCLEAR     NO SQUAMOUS EPITHELIAL CELLS SEEN     RARE GRAM POSITIVE COCCI     IN PAIRS RARE GRAM POSITIVE RODS     Performed at [facility]   Culture     Final   Value: FEW GROUP B STREP(S.AGALACTIAE)ISOLATED     Note: TESTING AGAINST S. AGALACTIAE NOT ROUTINELY PERFORMED DUE TO PREDICTABILITY OF AMP/PEN/VAN SUSCEPTIBILITY.     Performed at [facility]   Report Status 03/12/2014 FINAL   Final  MRSA PCR SCREENING     Status: Abnormal   Collection Time    03/10/14  2:30 AM      Result Value Ref Range Status   MRSA by PCR POSITIVE (*) NEGATIVE Final   Comment:            The GeneXpert MRSA Assay (FDA     approved for NASAL specimens     only), is one component of a     comprehensive MRSA colonization     surveillance program. It is not     intended to diagnose MRSA     infection nor to guide or     monitor treatment for     MRSA infections.     RESULT CALLED TO, READ BACK BY AND  VERIFIED WITH:     SPOKE WITH LOCKWWOD,K RN [phone] COV[place],N      Studies: No results found.  Scheduled Meds: . aztreonam  1 g Intravenous Q8H  . Chlorhexidine Gluconate Cloth  6 each Topical Q0600  . enoxaparin (LOVENOX) injection  40 mg Subcutaneous QHS  . insulin aspart  0-9 Units Subcutaneous TID WC  . latanoprost  1 drop Both Eyes QHS  . loratadine  10 mg Oral q morning - 10a  . metoprolol tartrate  12.5 mg Oral BID  . mupirocin ointment  1 application Nasal BID  . pantoprazole  40 mg Oral Daily  . risperiDONE  1.5 mg Oral BID  . simvastatin  10 mg Oral QHS  . sodium chloride  3 mL Intravenous Q12H  . tiZANidine  2 mg Oral BID   Continuous Infusions:    Time spent: > 35 minutes    [facility] [facility] Pager [phone]. If 7PM-7AM, please contact night-coverage at [email or web address], password [facility] 03/15/2014, 10:46 AM  LOS: 6 days

## 2014-03-15 NOTE — Progress Notes (Signed)
Per MD, Pt not ready for d/c; may be ready tomorrow.  Facility notified that Pt not ready today.  Facility stated that they can accept Pt back tomorrow, if ready.  Bernita Raisin, [place] Work [phone]

## 2014-03-15 NOTE — Progress Notes (Signed)
Pt BP elevated this am. 185/72 even with PRN hydralazine on board. Asymptomatic and other VSS. MD notified and will pass on to day shift. Lovina Reach, RN

## 2014-03-16 LAB — CULTURE, BLOOD (ROUTINE X 2): CULTURE: NO GROWTH

## 2014-03-16 LAB — CREATININE, SERUM
CREATININE: 0.52 mg/dL (ref 0.50–1.10)
GFR calc Af Amer: >90 mL/min (ref >90)

## 2014-03-16 LAB — GLUCOSE, CAPILLARY
GLUCOSE-CAPILLARY: 175 mg/dL — AB (ref 70–99)
Glucose-Capillary: 103 mg/dL — ABNORMAL HIGH (ref 70–99)

## 2014-03-16 MED ORDER — HEPARIN SOD (PORK) LOCK FLUSH 100 UNIT/ML IV SOLN
250.0000 [IU] | INTRAVENOUS | Status: AC | PRN
  Administered 2014-03-16: 250 [IU]

## 2014-03-16 MED ORDER — DEXTROSE 5 % IV SOLN: 1.0000 g | Freq: Three times a day (TID) | INTRAVENOUS | Status: DC

## 2014-03-16 NOTE — Progress Notes (Signed)
Per MD, Pt ready for d/c.  Notified RN, Pt and facility.  Pt asked CSW to contact her son in [place] to notify him of her d/c.  CSW attempted to contact Pt's son.  Phone rang endlessly.  Sent d/c summary via [facility].  Facility ready to receive Pt.  Arranged for transportation.  Bernita Raisin, [place] Work [phone]

## 2014-03-16 NOTE — Discharge Summary (Signed)
Physician Discharge Summary  Shannon Schmidt JXB:[id number] DOB: 06-05-1941 DOA: 03/09/2014  PCP: Theressa Millard, MD  Admit date: 03/09/2014 Discharge date: 03/16/2014  Time spent: > 35 minutes  Recommendations for Outpatient Follow-up:    Please see discharge instructions below Reassess blood pressures and adjust antihypertensive medications as needed  Discharge Diagnoses:  Principal Problem:   Pyelonephritis due to Escherichia coli Active Problems:   Ileus   Diabetes mellitus   Decubitus ulcers   HTN (hypertension)   Leukocytosis, unspecified   Anemia   Hyponatremia   Dyslipidemia   Discharge Condition: stable  Diet recommendation: diabetic diet  Filed Weights   03/09/14 2300  Weight: 85.2 kg (187 lb 13.3 oz)    History of present illness:  73 year old African American female with history of schizophrenia, bipolar 1 disorder, chronic osteomyelitis, diabetes mellitus, COPD, history of decubitus ulcers status post diverting colostomy who is bedbound secondary to RA and neuropathy. Presented to the ED after noticing foul-smelling urine which was associated with fevers.  Hospital Course:  Sepsis secondary to E. Coli UTI, bacteremia  - On admission, patient had a fever of 102.8 F, RR 24, HR 128 and elevated white blood cell count  - Patient also had evidence of urinary tract infection on UA and has sacral decubitus ulcer with chronic osteomyelitis.  - pt initially started on Vancomycin and Rocephin, consulted ID who recommended Aztreonam for 8 more days since PICC line placement. Will need to complete 7 more days of IV antibiotics at facility. - Recommend patient followup with infectious disease Dr. after completion of IV antibiotic regimen.  Active Problems:  UTI (lower urinary tract infection)  - ID recommending aztreonam.  - Please see discharge instructions and above  Ileus  - Resolved  Diabetes mellitus  -A shunt to continue home regimen prior to admission  and diabetic diet. Serum creatinine 0.8 on last check  Decubitus ulcers  - Consult requested for several pressure ulcers. Patient has multiple sites from previous ischemia grafts which have healed.  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 Moist gauze packing to left ischium per wound care  - Right inner buttock stage 3 wound 1x1x1cm. Right ischium with grey, macerated scar tissue. No open wound or drainage but scar has formed a deep valley 4x1x1cm, which is macerated from frequent moisture from incontinent urine. Moist gauze packing to left ischium per wound.  - Right second toe with full thickness wound 3x.3cm, 100% pink and covered with collagen which s not available at [facility] so per wound care dressing can remain in place until she is discharged to promote healing.  - Colostomy stable  - Mattress overlay while patient was in house  HTN  - Added low dose metoprolol  - Recommend PCP continue monitoring blood pressures and adjust blood pressure medication if indicated   Procedures:  None  Consultations:  None  Discharge Exam: Filed Vitals:   03/16/14 0527  BP: 167/68  Pulse: 91  Temp: 98 F (36.7 C)  Resp: 18    General: Patient in no acute distress, alert and awake Cardiovascular: Regular rate and rhythm no murmurs or rubs Respiratory: Clear to auscultation no wheezes no increased work of breathing  Discharge Instructions You were cared for by a hospitalist during your hospital stay. If you have any questions about your discharge medications or the care you received while you were in  the hospital after you are discharged, you can call the unit and asked to speak with the hospitalist on call if the hospitalist that took care of you is not available. Once you are discharged, your primary care physician will handle any further medical issues. Please note that NO  REFILLS for any discharge medications will be authorized once you are discharged, as it is imperative that you return to your primary care physician (or establish a relationship with a primary care physician if you do not have one) for your aftercare needs so that they can reassess your need for medications and monitor your lab values.  Discharge Orders   Future Orders Complete By Expires   Call MD for:  severe uncontrolled pain  As directed    Call MD for:  temperature >100.4  As directed    Diet - low sodium heart healthy  As directed    Discharge instructions  As directed    Increase activity slowly  As directed        Medication List         aztreonam 1 g in dextrose 5 % 50 mL  Inject 1 g into the vein every 8 (eight) hours.     bimatoprost 0.01 % Soln  Commonly known as:  LUMIGAN  Place 1 drop into both eyes at bedtime. Both eyes.     diclofenac sodium 1 % Gel  Commonly known as:  VOLTAREN  Apply 4 g topically 4 (four) times daily. Right wrist and knee     diphenhydrAMINE 25 mg capsule  Commonly known as:  BENADRYL  Take 25 mg by mouth every 4 (four) hours as needed. For itching and/or swelling.     insulin detemir 100 UNIT/ML injection  Commonly known as:  LEVEMIR  Inject 24-55 Units into the skin 2 (two) times daily. 24 in am and 55 in pm     insulin lispro 100 UNIT/ML injection  Commonly known as:  HUMALOG  - Inject 0-10 Units into the skin 2 (two) times daily. 61-150 0 units  - 151-200 2 units  - 201-250 4 units  - 251-300 6 units  - 301-350 8 units  - 351-400 10 units     loratadine 10 MG tablet  Commonly known as:  CLARITIN  Take 10 mg by mouth every morning.     magnesium hydroxide 400 MG/5ML suspension  Commonly known as:  MILK OF MAGNESIA  Take 30 mLs by mouth daily as needed for mild constipation.     MAPAP 325 MG tablet  Generic drug:  acetaminophen  Take by mouth 2 (two) times daily. For pain or fever.     acetaminophen 500 MG tablet   Commonly known as:  TYLENOL  Take 500 mg by mouth every 4 (four) hours as needed. For pain/discomfort.     metFORMIN 500 MG tablet  Commonly known as:  GLUCOPHAGE  Take 500 mg by mouth daily with breakfast.     MI-ACID GAS RELIEF 80 MG chewable tablet  Generic drug:  simethicone  Chew 80 mg by mouth every 12 (twelve) hours as needed. For gas.     omeprazole 20 MG capsule  Commonly known as:  PRILOSEC  Take 20 mg by mouth daily as needed. For heartburn.     oxyCODONE 15 MG immediate release tablet  Commonly known as:  ROXICODONE  Take 15 mg by mouth every 6 (six) hours as needed for pain.     risperiDONE 1 MG tablet  Commonly known as:  RISPERDAL  Take 1.5 mg by mouth 2 (two) times daily.     simvastatin 10 MG tablet  Commonly known as:  ZOCOR  Take 10 mg by mouth at bedtime.     tiZANidine 2 MG tablet  Commonly known as:  ZANAFLEX  Take 2 mg by mouth 2 (two) times daily.     vitamin C 500 MG tablet  Commonly known as:  ASCORBIC ACID  Take 500 mg by mouth 2 (two) times daily.       Allergies  Allergen Reactions  . Ace Inhibitors Other (See Comments)    Unknown   . Chlorpromazine Other (See Comments)    Unknown  . Haloperidol Decanoate Other (See Comments)    Unknown      The results of significant diagnostics from this hospitalization (including imaging, microbiology, ancillary and laboratory) are listed below for reference.    Significant Diagnostic Studies: Dg Chest 1 View  03/09/2014   CLINICAL DATA:  Fever, abdominal pain, history of abdominal surgery  EXAM: CHEST - 1 VIEW  COMPARISON:  DG PNEUMONIA CHEST 1V dated 10/20/2010  FINDINGS: Heart size and vascular pattern are normal. Lungs are clear except for mild scarring or discoid atelectasis at the left lung base, similar to prior study. Given the long-term similarity, scarring is favored.  IMPRESSION: No acute findings.  Mild scarring left lung base.   Electronically Signed   By: Skipper Cliche M.D.   On:  03/09/2014 19:31   Dg Hip Bilateral W/pelvis  03/10/2014   CLINICAL DATA:  Deep ulcers question osteomyelitis  EXAM: BILATERAL HIP WITH PELVIS - 4+ VIEW  COMPARISON:  None  FINDINGS: Severe osseous demineralization.  Mild degenerative changes of both hip joints.  Scattered enthesopathy.  No acute fracture dislocation.  Multiple foci of soft tissue gas or identified inferior to the left inferior pubic ramus.  These are of uncertain AP position.  No definite cortical bone destruction is seen at the inferior pubic rami bilaterally.  No definite radiographic evidence of osteomyelitis.  Numerous pelvic phleboliths.  Ostomy bag left lower quadrant.  IMPRESSION: Pronounced osseous demineralization.  Mild degenerative changes of both hip joints.  Abnormal foci of soft tissue gas inferiora to the left inferior pubic ramus, of uncertain antral posterior position.  Soft tissue gas inferior to left inferior pubic ramus without definite bone destruction.  Anteroposterior position of the gas is uncertain, question related to a posterior ulcer, doubt related to superimposed ostomy bag.  Consider CT imaging of the pelvis with IV contrast to assess for deep soft tissue infection/abscess and to exclude subtle bone destruction/ osteomyelitis.   Electronically Signed   By: Lavonia Dana M.D.   On: 03/10/2014 11:30   Dg Abd 1 View  03/09/2014   CLINICAL DATA:  Abdominal distention.  EXAM: ABDOMEN - 1 VIEW  COMPARISON:  CT ABD/PELVIS W CM dated 12/19/2009; DG ABD ACUTE W/CHEST dated 12/19/2009  FINDINGS: There is some gaseous prominence of both the colon and small bowel, likely reflecting ileus. No overt signs of free air are identified. Marked degenerative changes are present of the spine and hips.  IMPRESSION: Mild prominence of colon and small bowel likely reflecting ileus.   Electronically Signed   By: Aletta Edouard M.D.   On: 03/09/2014 19:29   Ct Pelvis W Contrast  03/11/2014   CLINICAL DATA:  Pelvic pain. Fever. Query  abscess or osteomyelitis.  EXAM: CT PELVIS WITH CONTRAST  TECHNIQUE: Multidetector CT imaging of the  pelvis was performed using the standard protocol following the bolus administration of intravenous contrast.  CONTRAST:  114mL OMNIPAQUE IOHEXOL 300 MG/ML  SOLN  COMPARISON:  DG HIP BILATERAL W/PELVIS dated 03/10/2014; CT ABD/PELVIS W CM dated 12/19/2009  FINDINGS: Aortoiliac atherosclerotic vascular disease noted.  Left colostomy.  Urinary bladder unremarkable.  Large bilateral decubitus ulcers extend to the bony surface of the ischial tuberosities which demonstrate sclerosis and spurring. The sclerosis is quite likely an indicator of chronic osteomyelitis given the exposure of the bone to the ulcerative wounds.  Decubitus ulcer extends towards the right greater trochanter which demonstrates flattening and extensive spurring. No definite bony destructive findings to suggest current osteomyelitis of the right greater trochanter. There appears to be some separation of the ulceration from the bony margin of the greater trochanter by a granulation tissue.  A smaller ulceration overlies the left greater trochanter but is separated from the trochanter by muscle and subcutaneous tissues.  IMPRESSION: 1. Bilateral decubitus ulcers extending to both ischial tuberosities, with sclerosis at the ischial tuberosities favoring chronic osteomyelitis. 2. Deep decubitus ulcer extending towards but not definitively reaching the right greater trochanter. There is underlying trochanteric deformity on the right but I doubt that there is active osteomyelitis involving the right greater trochanter. 3. Smaller superficial decubitus ulcer overlying the left greater trochanter.   Electronically Signed   By: Sherryl Barters M.D.   On: 03/11/2014 21:21    Microbiology: Recent Results (from the past 240 hour(s))  CULTURE, BLOOD (ROUTINE X 2)     Status: None   Collection Time    03/09/14  6:44 PM      Result Value Ref Range Status    Specimen Description BLOOD RIGHT ARM   Final   Special Requests     Final   Value: BOTTLES DRAWN AEROBIC AND ANAEROBIC 5 CC BOTH BOTTLES   Culture  Setup Time     Final   Value: 03/10/2014 02:26     Performed at [facility]   Culture     Final   Value:        BLOOD CULTURE RECEIVED NO GROWTH TO DATE CULTURE WILL BE HELD FOR 5 DAYS BEFORE ISSUING A FINAL NEGATIVE REPORT     Performed at [facility]   Report Status PENDING   Incomplete  CULTURE, BLOOD (ROUTINE X 2)     Status: None   Collection Time    03/09/14  6:56 PM      Result Value Ref Range Status   Specimen Description BLOOD LEFT ARM  5 ML IN [facility] BOTTLE   Final   Special Requests NONE   Final   Culture  Setup Time     Final   Value: 03/10/2014 02:27     Performed at [facility]   Culture     Final   Value: ESCHERICHIA COLI     Note: Gram Stain Report Called to,Read Back By and Verified With: JENNIFER ZAPPIA AT 6:12 P.M. ON 03/10/14 WARRB     Performed at [facility]   Report Status 03/12/2014 FINAL   Final   Organism ID, Bacteria ESCHERICHIA COLI   Final  URINE CULTURE     Status: None   Collection Time    03/09/14  7:32 PM      Result Value Ref Range Status   Specimen Description URINE, CATHETERIZED   Final   Special Requests NONE   Final   Culture  Setup Time  Final   Value: 03/10/2014 03:34     Performed at [place]     Final   Value: >=100,000 COLONIES/ML     Performed at [facility]   Culture     Final   Value: ESCHERICHIA COLI     Performed at [facility]   Report Status 03/11/2014 FINAL   Final   Organism ID, Bacteria ESCHERICHIA COLI   Final  WOUND CULTURE     Status: None   Collection Time    03/10/14  2:30 AM      Result Value Ref Range Status   Specimen Description WOUND LEFT BUTTOCKS   Final   Special Requests Normal   Final   Gram Stain     Final   Value: FEW WBC PRESENT,BOTH PMN AND MONONUCLEAR     NO SQUAMOUS  EPITHELIAL CELLS SEEN     RARE GRAM POSITIVE COCCI     IN PAIRS RARE GRAM POSITIVE RODS     Performed at [facility]   Culture     Final   Value: FEW GROUP B STREP(S.AGALACTIAE)ISOLATED     Note: TESTING AGAINST S. AGALACTIAE NOT ROUTINELY PERFORMED DUE TO PREDICTABILITY OF AMP/PEN/VAN SUSCEPTIBILITY.     Performed at [facility]   Report Status 03/12/2014 FINAL   Final  MRSA PCR SCREENING     Status: Abnormal   Collection Time    03/10/14  2:30 AM      Result Value Ref Range Status   MRSA by PCR POSITIVE (*) NEGATIVE Final   Comment:            The GeneXpert MRSA Assay (FDA     approved for NASAL specimens     only), is one component of a     comprehensive MRSA colonization     surveillance program. It is not     intended to diagnose MRSA     infection nor to guide or     monitor treatment for     MRSA infections.     RESULT CALLED TO, READ BACK BY AND VERIFIED WITH:     SPOKE WITH LOCKWWOD,K RN [phone] [id number] [place],N      Labs: Basic Metabolic Panel:  Recent Labs Lab 03/09/14 1847 03/10/14 0412 03/12/14 0356 03/16/14 0445  NA 133* 135* 137  --   K 3.8 3.8 3.5*  --   CL 95* 99 99  --   CO2 25 26 25   --   GLUCOSE 194* 141* 196*  --   BUN 17 16 13   --   CREATININE 0.65 0.68 0.83 0.52  CALCIUM 9.2 8.7 8.6  --   MG  --  2.0  --   --   PHOS  --  3.1  --   --    Liver Function Tests:  Recent Labs Lab 03/09/14 1847 03/10/14 0412  AST 12 11  ALT 7 7  ALKPHOS 54 49  BILITOT 0.4 0.4  PROT 8.2 7.5  ALBUMIN 2.9* 2.7*   No results found for this basename: LIPASE, AMYLASE,  in the last 168 hours No results found for this basename: AMMONIA,  in the last 168 hours CBC:  Recent Labs Lab 03/09/14 1847 03/10/14 0412 03/12/14 0356  WBC 11.5* 11.4* 9.2  NEUTROABS 9.7*  --   --   HGB 11.1* 10.4* 9.9*  HCT 34.0* 32.6* 31.0*  MCV 87.6 88.3 85.9  PLT 225 207 246  Cardiac Enzymes: No results found for this basename: CKTOTAL, CKMB, CKMBINDEX,  TROPONINI,  in the last 168 hours BNP: BNP (last 3 results) No results found for this basename: PROBNP,  in the last 8760 hours CBG:  Recent Labs Lab 03/15/14 0736 03/15/14 1150 03/15/14 1703 03/15/14 2127 03/16/14 0733  GLUCAP 256* 356* 231* 229* 103*       Signed:  [place]  [facility] 03/16/2014, 11:39 AM

## 2014-03-16 NOTE — Progress Notes (Signed)
No change in initial AM assessment. EMS here to pick pt up. PT to go with PICC line. Pt stable at time of transprt.

## 2014-03-31 ENCOUNTER — Encounter (HOSPITAL_BASED_OUTPATIENT_CLINIC_OR_DEPARTMENT_OTHER): Payer: Medicare Other | Attending: Internal Medicine

## 2014-03-31 DIAGNOSIS — L98499 Non-pressure chronic ulcer of skin of other sites with unspecified severity: Secondary | ICD-10-CM | POA: Insufficient documentation

## 2014-03-31 DIAGNOSIS — M205X9 Other deformities of toe(s) (acquired), unspecified foot: Secondary | ICD-10-CM | POA: Insufficient documentation

## 2014-03-31 DIAGNOSIS — L97509 Non-pressure chronic ulcer of other part of unspecified foot with unspecified severity: Secondary | ICD-10-CM | POA: Insufficient documentation

## 2014-04-01 NOTE — Progress Notes (Signed)
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  The patient reports that since her last visit here, she was admitted with fevers to the hospital and is currently still undergoing treatment for urinary tract infection with IV antibiotics through a PICC line. She notes that no dressings were placed over her toe wound while she was in the hospital.  PHYSICAL EXAMINATION:  VITAL SIGNS:  Blood pressure is 99/62, pulse is 82, blood glucose is 160, temperature is 98.1.  She has stage IV ulcerations over bilateral ischia, both of them are down to bone with complete granulation present over both bones.  Right ischium is measured as 2 x 0.7 x 0.2 cm, this is contracted since her last visit.  Over the left ischium, she is 4.6 x 3 x 0.8 cm, this is also slightly contracted since her last visit.  The patient is completely anesthetic in these areas and curette was used to remove all the superficial slough present over the entirety of both the ischial wounds for selective debridement.  Over her right second toe, she has a Wagner 3 ulceration. Though she initially presented down to her joint space, it is now contracted to 0.2 x 0.3 x 0.1 cm.  There is scant drainage present.  There is no exposure of joint space.  Continue with collagen to all of her wounds followed by foam to both of her ischial wounds and follow up in 2 week's time.          ______________________________ Irene Limbo, MD MBA     BT/MEDQ  D:  03/31/2014  T:  04/01/2014  Job:  [id number]

## 2014-04-16 NOTE — Progress Notes (Signed)
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  Her current wound care has been collagen to all of the above wounds.  On examination, VITAL SIGNS:  Blood pressure is 115/65, pulse is 65, temperature is 98.2, blood glucose is 158. EXTREMITIES:  Right second toe wound which was a previous Wagner 3 is now completely epithelialized.  She does have deformity at the PIP joint of this digit.  Over the right ischium, the wound is quite clean and is continuing to contract.  The wound size is measured as 1.4 x 0.5 x 0.2 cm.  Over the left ischium, she has open wound that is measured as 5.2 x 3 x 0.8 cm.  This is minimally changed since her last visit.  The patient is completely anesthetic in the area.  Curette was used to remove all the superficial slough over the left ischial wound for selective debridement.  We will plan to continue with collagen and foam over bilateral ischial ulcers.  She will follow up in 1 month's time.  Discussed with the patient that she does have deformity of that toe and I suspect that she had some type of trauma that caused dislocation at the joint and proper care needs to be taken with all of her transfers to prevent further trauma to this area.          ______________________________ Irene Limbo, MD     BT/MEDQ  D:  04/14/2014  T:  04/15/2014  Job:  [id number]

## 2014-05-12 ENCOUNTER — Encounter (HOSPITAL_BASED_OUTPATIENT_CLINIC_OR_DEPARTMENT_OTHER): Payer: Medicare Other | Attending: Plastic Surgery

## 2014-05-12 DIAGNOSIS — L8994 Pressure ulcer of unspecified site, stage 4: Secondary | ICD-10-CM | POA: Insufficient documentation

## 2014-05-12 DIAGNOSIS — L89309 Pressure ulcer of unspecified buttock, unspecified stage: Secondary | ICD-10-CM | POA: Insufficient documentation

## 2014-06-09 ENCOUNTER — Encounter (HOSPITAL_BASED_OUTPATIENT_CLINIC_OR_DEPARTMENT_OTHER): Payer: Medicare Other | Attending: Plastic Surgery

## 2014-06-09 DIAGNOSIS — L8994 Pressure ulcer of unspecified site, stage 4: Secondary | ICD-10-CM | POA: Diagnosis not present

## 2014-06-09 DIAGNOSIS — G8220 Paraplegia, unspecified: Secondary | ICD-10-CM | POA: Insufficient documentation

## 2014-06-09 DIAGNOSIS — L89309 Pressure ulcer of unspecified buttock, unspecified stage: Secondary | ICD-10-CM | POA: Diagnosis not present

## 2014-06-10 NOTE — Progress Notes (Signed)
[facility]  NAME:  TYRIHANNA, WINGERT NO.:  MEDICAL RECORD NO.:  [id number]      DATE OF BIRTH:  [birth year]  PHYSICIAN:  Irene Limbo, MD    VISIT DATE:  06/09/2014                                  OFFICE VISIT   Ms. Demarco is here for followup of bilateral chronic ischial ulcers in the setting of paraplegia.  Current wound care has been collagen.  PHYSICAL EXAMINATION:  Blood pressure is 105/53, pulse 77, blood glucose 102, temperature is 98.1.  Right ischial wound is measured as 2.3 x 0.5 x 0.2 cm. Wound was clean without slough present.  No debridement was performed.  Over the left ischium, she has a stage IV ulceration measured as 5.1 x 3.5 x 1.0 cm.  There is dark discoloration over side wall of the wound consistent with pressure.  The patient is anesthetic in the area.  Scissors were used to tangentially debride the necrotic skin and subcutaneous tissue over the side wall.  Post treatment measurements were the same.  The patient tolerated the procedure well.  We will continue with collagen and foam dressing and followup in 1 month's time.          ______________________________ Irene Limbo, MD     BT/MEDQ  D:  06/09/2014  T:  06/10/2014  Job:  [id number]

## 2014-07-07 ENCOUNTER — Encounter (HOSPITAL_BASED_OUTPATIENT_CLINIC_OR_DEPARTMENT_OTHER): Payer: Medicare Other | Attending: Plastic Surgery

## 2014-07-07 DIAGNOSIS — L8994 Pressure ulcer of unspecified site, stage 4: Secondary | ICD-10-CM | POA: Diagnosis not present

## 2014-07-07 DIAGNOSIS — L89309 Pressure ulcer of unspecified buttock, unspecified stage: Secondary | ICD-10-CM | POA: Insufficient documentation

## 2014-08-11 ENCOUNTER — Encounter (HOSPITAL_BASED_OUTPATIENT_CLINIC_OR_DEPARTMENT_OTHER): Payer: Medicare Other | Attending: Plastic Surgery

## 2014-08-11 DIAGNOSIS — L89309 Pressure ulcer of unspecified buttock, unspecified stage: Secondary | ICD-10-CM | POA: Insufficient documentation

## 2014-08-11 DIAGNOSIS — L8994 Pressure ulcer of unspecified site, stage 4: Secondary | ICD-10-CM | POA: Insufficient documentation

## 2014-08-18 DIAGNOSIS — L89309 Pressure ulcer of unspecified buttock, unspecified stage: Secondary | ICD-10-CM | POA: Diagnosis present

## 2014-08-18 DIAGNOSIS — L8994 Pressure ulcer of unspecified site, stage 4: Secondary | ICD-10-CM | POA: Diagnosis not present

## 2014-09-15 ENCOUNTER — Encounter (HOSPITAL_BASED_OUTPATIENT_CLINIC_OR_DEPARTMENT_OTHER): Payer: Medicare Other | Attending: Plastic Surgery

## 2014-09-15 DIAGNOSIS — L89894 Pressure ulcer of other site, stage 4: Secondary | ICD-10-CM | POA: Diagnosis not present

## 2014-09-15 LAB — GLUCOSE, CAPILLARY: Glucose-Capillary: 151 mg/dL — ABNORMAL HIGH (ref 70–99)

## 2014-10-13 ENCOUNTER — Encounter (HOSPITAL_BASED_OUTPATIENT_CLINIC_OR_DEPARTMENT_OTHER): Payer: Medicare Other | Attending: Plastic Surgery

## 2014-10-13 DIAGNOSIS — L89314 Pressure ulcer of right buttock, stage 4: Secondary | ICD-10-CM | POA: Diagnosis not present

## 2014-10-13 DIAGNOSIS — L89324 Pressure ulcer of left buttock, stage 4: Secondary | ICD-10-CM | POA: Insufficient documentation

## 2014-11-10 ENCOUNTER — Encounter (HOSPITAL_BASED_OUTPATIENT_CLINIC_OR_DEPARTMENT_OTHER): Payer: Medicare Other | Attending: Plastic Surgery

## 2014-11-10 DIAGNOSIS — L89314 Pressure ulcer of right buttock, stage 4: Secondary | ICD-10-CM | POA: Insufficient documentation

## 2014-11-10 DIAGNOSIS — L89324 Pressure ulcer of left buttock, stage 4: Secondary | ICD-10-CM | POA: Insufficient documentation

## 2014-11-11 NOTE — Progress Notes (Signed)
[facility]  NAME:  Shannon Schmidt, Shannon Schmidt                   ACCOUNT NO.:  MEDICAL RECORD NO.:  [id number]      DATE OF BIRTH:  [birth year]  PHYSICIAN:  Theodoro Kos, DO       VISIT DATE:  11/10/2014                                  OFFICE VISIT   The patient is a 73 year old female who is here for followup on her bilateral ischial ulcers.  She is in a nursing facility and states that she does have an air mattress bed.  She is being treated with wet-to-dry dressings.  She says that she is getting wet-to-dry dressings sometimes once a day and sometimes once every other day.  REVIEW OF SYSTEMS:  No other medical conditions are flaring up at this time and she does not have any complaints other than knee pain due to previous surgery.  PHYSICAL EXAMINATION:  GENERAL:  She is alert, oriented, cooperative, not in any acute distress.  She is pleasant. HEENT:  Her pupils are equal. LUNGS:  Her breathing is unlabored. HEART:  Her heart rate is regular. EXTREMITIES:  The wounds were debrided and those notes were noted in the chart.  Recommendation is to continue with offloading multivitamin, vitamin C, protein, and wet-to-dry dressings 3 times a day.  The description of the wound is in the chart.     Theodoro Kos, DO     CS/MEDQ  D:  11/10/2014  T:  11/11/2014  Job:  [id number]

## 2014-12-15 ENCOUNTER — Encounter (HOSPITAL_BASED_OUTPATIENT_CLINIC_OR_DEPARTMENT_OTHER): Payer: Medicare Other

## 2014-12-29 ENCOUNTER — Encounter (HOSPITAL_BASED_OUTPATIENT_CLINIC_OR_DEPARTMENT_OTHER): Payer: Medicare Other | Attending: Plastic Surgery

## 2014-12-29 DIAGNOSIS — L89324 Pressure ulcer of left buttock, stage 4: Secondary | ICD-10-CM | POA: Insufficient documentation

## 2014-12-29 DIAGNOSIS — L89314 Pressure ulcer of right buttock, stage 4: Secondary | ICD-10-CM | POA: Insufficient documentation

## 2015-03-02 ENCOUNTER — Encounter (HOSPITAL_BASED_OUTPATIENT_CLINIC_OR_DEPARTMENT_OTHER): Payer: Medicare Other | Attending: Plastic Surgery

## 2015-03-02 DIAGNOSIS — L89313 Pressure ulcer of right buttock, stage 3: Secondary | ICD-10-CM | POA: Insufficient documentation

## 2015-03-02 DIAGNOSIS — R32 Unspecified urinary incontinence: Secondary | ICD-10-CM | POA: Insufficient documentation

## 2015-03-02 DIAGNOSIS — L89323 Pressure ulcer of left buttock, stage 3: Secondary | ICD-10-CM | POA: Insufficient documentation

## 2015-03-02 DIAGNOSIS — G8220 Paraplegia, unspecified: Secondary | ICD-10-CM | POA: Insufficient documentation

## 2015-03-09 DIAGNOSIS — L89313 Pressure ulcer of right buttock, stage 3: Secondary | ICD-10-CM | POA: Diagnosis not present

## 2015-03-09 DIAGNOSIS — L89323 Pressure ulcer of left buttock, stage 3: Secondary | ICD-10-CM | POA: Diagnosis not present

## 2015-03-09 DIAGNOSIS — R32 Unspecified urinary incontinence: Secondary | ICD-10-CM | POA: Diagnosis not present

## 2015-03-09 DIAGNOSIS — G8220 Paraplegia, unspecified: Secondary | ICD-10-CM | POA: Diagnosis not present

## 2015-04-06 ENCOUNTER — Encounter (HOSPITAL_BASED_OUTPATIENT_CLINIC_OR_DEPARTMENT_OTHER): Payer: Medicare Other | Attending: Plastic Surgery

## 2015-07-20 ENCOUNTER — Encounter (HOSPITAL_BASED_OUTPATIENT_CLINIC_OR_DEPARTMENT_OTHER): Payer: Medicare Other | Attending: Plastic Surgery

## 2015-07-20 DIAGNOSIS — L89314 Pressure ulcer of right buttock, stage 4: Secondary | ICD-10-CM | POA: Insufficient documentation

## 2015-07-20 DIAGNOSIS — L89324 Pressure ulcer of left buttock, stage 4: Secondary | ICD-10-CM | POA: Diagnosis not present

## 2015-07-20 DIAGNOSIS — R32 Unspecified urinary incontinence: Secondary | ICD-10-CM | POA: Insufficient documentation

## 2015-07-20 DIAGNOSIS — G8220 Paraplegia, unspecified: Secondary | ICD-10-CM | POA: Diagnosis not present

## 2015-08-17 ENCOUNTER — Encounter (HOSPITAL_BASED_OUTPATIENT_CLINIC_OR_DEPARTMENT_OTHER): Payer: Medicare Other | Attending: Plastic Surgery

## 2017-06-08 ENCOUNTER — Inpatient Hospital Stay (HOSPITAL_COMMUNITY)
Admission: EM | Admit: 2017-06-08 | Discharge: 2017-06-15 | DRG: 871 | Disposition: A | Discharge status: Skilled Nursing Facility | Payer: Medicare Other | Attending: Internal Medicine | Admitting: Internal Medicine

## 2017-06-08 ENCOUNTER — Encounter (HOSPITAL_COMMUNITY): Payer: Self-pay | Admitting: Emergency Medicine

## 2017-06-08 ENCOUNTER — Emergency Department (HOSPITAL_COMMUNITY): Payer: Medicare Other

## 2017-06-08 DIAGNOSIS — Z860100 Personal history of colon polyps, unspecified: Secondary | ICD-10-CM

## 2017-06-08 DIAGNOSIS — M869 Osteomyelitis, unspecified: Secondary | ICD-10-CM | POA: Diagnosis not present

## 2017-06-08 DIAGNOSIS — E119 Type 2 diabetes mellitus without complications: Secondary | ICD-10-CM

## 2017-06-08 DIAGNOSIS — M86671 Other chronic osteomyelitis, right ankle and foot: Secondary | ICD-10-CM | POA: Diagnosis present

## 2017-06-08 DIAGNOSIS — B370 Candidal stomatitis: Secondary | ICD-10-CM | POA: Diagnosis present

## 2017-06-08 DIAGNOSIS — J189 Pneumonia, unspecified organism: Secondary | ICD-10-CM | POA: Diagnosis present

## 2017-06-08 DIAGNOSIS — L8990 Pressure ulcer of unspecified site, unspecified stage: Secondary | ICD-10-CM | POA: Diagnosis present

## 2017-06-08 DIAGNOSIS — Z87891 Personal history of nicotine dependence: Secondary | ICD-10-CM

## 2017-06-08 DIAGNOSIS — G8929 Other chronic pain: Secondary | ICD-10-CM | POA: Diagnosis present

## 2017-06-08 DIAGNOSIS — D638 Anemia in other chronic diseases classified elsewhere: Secondary | ICD-10-CM | POA: Diagnosis present

## 2017-06-08 DIAGNOSIS — J440 Chronic obstructive pulmonary disease with acute lower respiratory infection: Secondary | ICD-10-CM | POA: Diagnosis present

## 2017-06-08 DIAGNOSIS — K449 Diaphragmatic hernia without obstruction or gangrene: Secondary | ICD-10-CM | POA: Diagnosis not present

## 2017-06-08 DIAGNOSIS — F209 Schizophrenia, unspecified: Secondary | ICD-10-CM | POA: Diagnosis present

## 2017-06-08 DIAGNOSIS — Z794 Long term (current) use of insulin: Secondary | ICD-10-CM

## 2017-06-08 DIAGNOSIS — E43 Unspecified severe protein-calorie malnutrition: Secondary | ICD-10-CM | POA: Diagnosis present

## 2017-06-08 DIAGNOSIS — D649 Anemia, unspecified: Secondary | ICD-10-CM | POA: Diagnosis present

## 2017-06-08 DIAGNOSIS — D122 Benign neoplasm of ascending colon: Secondary | ICD-10-CM | POA: Diagnosis present

## 2017-06-08 DIAGNOSIS — R0902 Hypoxemia: Secondary | ICD-10-CM | POA: Diagnosis present

## 2017-06-08 DIAGNOSIS — Z823 Family history of stroke: Secondary | ICD-10-CM

## 2017-06-08 DIAGNOSIS — E11628 Type 2 diabetes mellitus with other skin complications: Secondary | ICD-10-CM | POA: Diagnosis not present

## 2017-06-08 DIAGNOSIS — Z8701 Personal history of pneumonia (recurrent): Secondary | ICD-10-CM

## 2017-06-08 DIAGNOSIS — E11621 Type 2 diabetes mellitus with foot ulcer: Secondary | ICD-10-CM | POA: Diagnosis present

## 2017-06-08 DIAGNOSIS — G8220 Paraplegia, unspecified: Secondary | ICD-10-CM | POA: Diagnosis present

## 2017-06-08 DIAGNOSIS — Z66 Do not resuscitate: Secondary | ICD-10-CM | POA: Diagnosis present

## 2017-06-08 DIAGNOSIS — I10 Essential (primary) hypertension: Secondary | ICD-10-CM | POA: Diagnosis present

## 2017-06-08 DIAGNOSIS — L97509 Non-pressure chronic ulcer of other part of unspecified foot with unspecified severity: Secondary | ICD-10-CM | POA: Diagnosis present

## 2017-06-08 DIAGNOSIS — K2950 Unspecified chronic gastritis without bleeding: Secondary | ICD-10-CM | POA: Diagnosis not present

## 2017-06-08 DIAGNOSIS — K2970 Gastritis, unspecified, without bleeding: Secondary | ICD-10-CM | POA: Diagnosis present

## 2017-06-08 DIAGNOSIS — Z6829 Body mass index (BMI) 29.0-29.9, adult: Secondary | ICD-10-CM

## 2017-06-08 DIAGNOSIS — D123 Benign neoplasm of transverse colon: Secondary | ICD-10-CM | POA: Diagnosis not present

## 2017-06-08 DIAGNOSIS — Z1624 Resistance to multiple antibiotics: Secondary | ICD-10-CM | POA: Diagnosis present

## 2017-06-08 DIAGNOSIS — Y95 Nosocomial condition: Secondary | ICD-10-CM | POA: Diagnosis present

## 2017-06-08 DIAGNOSIS — E1169 Type 2 diabetes mellitus with other specified complication: Secondary | ICD-10-CM | POA: Diagnosis present

## 2017-06-08 DIAGNOSIS — R1310 Dysphagia, unspecified: Secondary | ICD-10-CM | POA: Diagnosis present

## 2017-06-08 DIAGNOSIS — Z833 Family history of diabetes mellitus: Secondary | ICD-10-CM

## 2017-06-08 DIAGNOSIS — K31819 Angiodysplasia of stomach and duodenum without bleeding: Secondary | ICD-10-CM | POA: Diagnosis not present

## 2017-06-08 DIAGNOSIS — D509 Iron deficiency anemia, unspecified: Secondary | ICD-10-CM | POA: Diagnosis present

## 2017-06-08 DIAGNOSIS — A419 Sepsis, unspecified organism: Secondary | ICD-10-CM | POA: Diagnosis present

## 2017-06-08 DIAGNOSIS — Z8249 Family history of ischemic heart disease and other diseases of the circulatory system: Secondary | ICD-10-CM

## 2017-06-08 DIAGNOSIS — L89304 Pressure ulcer of unspecified buttock, stage 4: Secondary | ICD-10-CM | POA: Diagnosis not present

## 2017-06-08 DIAGNOSIS — R32 Unspecified urinary incontinence: Secondary | ICD-10-CM | POA: Diagnosis present

## 2017-06-08 DIAGNOSIS — Z7401 Bed confinement status: Secondary | ICD-10-CM

## 2017-06-08 DIAGNOSIS — M86672 Other chronic osteomyelitis, left ankle and foot: Secondary | ICD-10-CM | POA: Diagnosis present

## 2017-06-08 DIAGNOSIS — Z933 Colostomy status: Secondary | ICD-10-CM

## 2017-06-08 DIAGNOSIS — L89154 Pressure ulcer of sacral region, stage 4: Secondary | ICD-10-CM | POA: Diagnosis present

## 2017-06-08 DIAGNOSIS — F319 Bipolar disorder, unspecified: Secondary | ICD-10-CM | POA: Diagnosis present

## 2017-06-08 DIAGNOSIS — K5710 Diverticulosis of small intestine without perforation or abscess without bleeding: Secondary | ICD-10-CM | POA: Diagnosis present

## 2017-06-08 DIAGNOSIS — K5730 Diverticulosis of large intestine without perforation or abscess without bleeding: Secondary | ICD-10-CM | POA: Diagnosis present

## 2017-06-08 DIAGNOSIS — K219 Gastro-esophageal reflux disease without esophagitis: Secondary | ICD-10-CM | POA: Diagnosis present

## 2017-06-08 DIAGNOSIS — M069 Rheumatoid arthritis, unspecified: Secondary | ICD-10-CM | POA: Diagnosis present

## 2017-06-08 LAB — POC OCCULT BLOOD, ED: Fecal Occult Bld: NEGATIVE

## 2017-06-08 LAB — CBC WITH DIFFERENTIAL/PLATELET
Basophils Absolute: 0 10*3/uL (ref 0.0–0.1)
Basophils Relative: 0 %
Eosinophils Absolute: 0 10*3/uL (ref 0.0–0.7)
Eosinophils Relative: 0 %
HCT: 22.3 % — ABNORMAL LOW (ref 36.0–46.0)
Hemoglobin: 6.8 g/dL — CL (ref 12.0–15.0)
Lymphocytes Relative: 8 %
Lymphs Abs: 1.3 10*3/uL (ref 0.7–4.0)
MCH: 21.5 pg — ABNORMAL LOW (ref 26.0–34.0)
MCHC: 30.5 g/dL (ref 30.0–36.0)
MCV: 70.6 fL — ABNORMAL LOW (ref 78.0–100.0)
Monocytes Absolute: 1.5 10*3/uL — ABNORMAL HIGH (ref 0.1–1.0)
Monocytes Relative: 9 %
Neutro Abs: 13.5 10*3/uL — ABNORMAL HIGH (ref 1.7–7.7)
Neutrophils Relative %: 83 %
Platelets: 405 10*3/uL — ABNORMAL HIGH (ref 150–400)
RBC: 3.16 MIL/uL — ABNORMAL LOW (ref 3.87–5.11)
RDW: 20.1 % — ABNORMAL HIGH (ref 11.5–15.5)
WBC: 16.2 10*3/uL — ABNORMAL HIGH (ref 4.0–10.5)

## 2017-06-08 LAB — URINALYSIS, ROUTINE W REFLEX MICROSCOPIC
Bacteria, UA: NONE SEEN
Bilirubin Urine: NEGATIVE
Glucose, UA: NEGATIVE mg/dL
Ketones, ur: NEGATIVE mg/dL
Nitrite: NEGATIVE
Protein, ur: NEGATIVE mg/dL
Specific Gravity, Urine: 1.002 — ABNORMAL LOW (ref 1.005–1.030)
Squamous Epithelial / LPF: NONE SEEN
pH: 6 (ref 5.0–8.0)

## 2017-06-08 LAB — I-STAT CHEM 8, ED
BUN: 14 mg/dL (ref 6–20)
CALCIUM ION: 0.91 mmol/L — AB (ref 1.15–1.40)
CREATININE: 0.6 mg/dL (ref 0.44–1.00)
Chloride: 94 mmol/L — ABNORMAL LOW (ref 101–111)
Glucose, Bld: 88 mg/dL (ref 65–99)
HEMATOCRIT: 22 % — AB (ref 36.0–46.0)
Hemoglobin: 7.5 g/dL — ABNORMAL LOW (ref 12.0–15.0)
Potassium: 6 mmol/L — ABNORMAL HIGH (ref 3.5–5.1)
SODIUM: 131 mmol/L — AB (ref 135–145)
TCO2: 32 mmol/L (ref 0–100)

## 2017-06-08 LAB — COMPREHENSIVE METABOLIC PANEL
ALT: 21 U/L (ref 14–54)
AST: 42 U/L — ABNORMAL HIGH (ref 15–41)
Albumin: 2.1 g/dL — ABNORMAL LOW (ref 3.5–5.0)
Alkaline Phosphatase: 70 U/L (ref 38–126)
Anion gap: 8 (ref 5–15)
BUN: 12 mg/dL (ref 6–20)
CO2: 29 mmol/L (ref 22–32)
Calcium: 7.5 mg/dL — ABNORMAL LOW (ref 8.9–10.3)
Chloride: 96 mmol/L — ABNORMAL LOW (ref 101–111)
Creatinine, Ser: 0.71 mg/dL (ref 0.44–1.00)
GFR calc Af Amer: >60 mL/min (ref >60)
GFR calc non Af Amer: >60 mL/min (ref >60)
Glucose, Bld: 83 mg/dL (ref 65–99)
Potassium: 5.1 mmol/L (ref 3.5–5.1)
Sodium: 133 mmol/L — ABNORMAL LOW (ref 135–145)
Total Bilirubin: 0.8 mg/dL (ref 0.3–1.2)
Total Protein: 7.2 g/dL (ref 6.5–8.1)

## 2017-06-08 LAB — PREPARE RBC (CROSSMATCH)

## 2017-06-08 LAB — GLUCOSE, CAPILLARY: Glucose-Capillary: 91 mg/dL (ref 65–99)

## 2017-06-08 LAB — MRSA PCR SCREENING: MRSA by PCR: NEGATIVE

## 2017-06-08 LAB — I-STAT CG4 LACTIC ACID, ED: Lactic Acid, Venous: 1.33 mmol/L (ref 0.5–1.9)

## 2017-06-08 MED ORDER — CHLORHEXIDINE GLUCONATE CLOTH 2 % EX PADS
6.0000 | MEDICATED_PAD | Freq: Every day | CUTANEOUS | Status: DC
  Administered 2017-06-08 – 2017-06-15 (×6): 6 via TOPICAL

## 2017-06-08 MED ORDER — LORATADINE 10 MG PO TABS
10.0000 mg | ORAL_TABLET | Freq: Every morning | ORAL | Status: DC
  Administered 2017-06-09 – 2017-06-15 (×7): 10 mg via ORAL
  Filled 2017-06-08 (×7): qty 1

## 2017-06-08 MED ORDER — IPRATROPIUM-ALBUTEROL 0.5-2.5 (3) MG/3ML IN SOLN: 3.0000 mL | Freq: Four times a day (QID) | RESPIRATORY_TRACT | Status: DC

## 2017-06-08 MED ORDER — SODIUM CHLORIDE 0.9 % IV SOLN
INTRAVENOUS | Status: DC
  Administered 2017-06-08: 23:00:00 via INTRAVENOUS

## 2017-06-08 MED ORDER — FLUCONAZOLE 100 MG PO TABS
200.0000 mg | ORAL_TABLET | Freq: Every day | ORAL | Status: DC
  Administered 2017-06-09 – 2017-06-13 (×6): 200 mg via ORAL
  Filled 2017-06-08 (×6): qty 2

## 2017-06-08 MED ORDER — SODIUM CHLORIDE 0.9 % IV BOLUS (SEPSIS)
1000.0000 mL | Freq: Once | INTRAVENOUS | Status: AC
  Administered 2017-06-08: 1000 mL via INTRAVENOUS

## 2017-06-08 MED ORDER — ALTEPLASE 2 MG IJ SOLR
2.0000 mg | Freq: Once | INTRAMUSCULAR | Status: AC
  Administered 2017-06-08: 2 mg
  Filled 2017-06-08: qty 2

## 2017-06-08 MED ORDER — INSULIN DETEMIR 100 UNIT/ML ~~LOC~~ SOLN: 26.0000 [IU] | Freq: Two times a day (BID) | SUBCUTANEOUS | Status: DC

## 2017-06-08 MED ORDER — DIVALPROEX SODIUM ER 250 MG PO TB24
250.0000 mg | ORAL_TABLET | Freq: Every day | ORAL | Status: DC
  Administered 2017-06-08 – 2017-06-14 (×7): 250 mg via ORAL
  Filled 2017-06-08 (×7): qty 1

## 2017-06-08 MED ORDER — PIPERACILLIN-TAZOBACTAM 3.375 G IVPB
3.3750 g | Freq: Three times a day (TID) | INTRAVENOUS | Status: DC
  Administered 2017-06-09 – 2017-06-14 (×17): 3.375 g via INTRAVENOUS
  Filled 2017-06-08 (×18): qty 50

## 2017-06-08 MED ORDER — IPRATROPIUM-ALBUTEROL 0.5-2.5 (3) MG/3ML IN SOLN
RESPIRATORY_TRACT | Status: AC
  Administered 2017-06-08: 3 mL
  Filled 2017-06-08: qty 3

## 2017-06-08 MED ORDER — RISPERIDONE 1 MG PO TABS
1.5000 mg | ORAL_TABLET | Freq: Every day | ORAL | Status: DC
  Administered 2017-06-08 – 2017-06-14 (×7): 1.5 mg via ORAL
  Filled 2017-06-08 (×7): qty 2

## 2017-06-08 MED ORDER — SODIUM CHLORIDE 0.9% FLUSH: 10.0000 mL | INTRAVENOUS | Status: DC | PRN

## 2017-06-08 MED ORDER — PANTOPRAZOLE SODIUM 40 MG PO TBEC
40.0000 mg | DELAYED_RELEASE_TABLET | Freq: Every day | ORAL | Status: DC
  Administered 2017-06-09 – 2017-06-15 (×7): 40 mg via ORAL
  Filled 2017-06-08 (×7): qty 1

## 2017-06-08 MED ORDER — FERROUS SULFATE 325 (65 FE) MG PO TABS
325.0000 mg | ORAL_TABLET | Freq: Two times a day (BID) | ORAL | Status: DC
  Administered 2017-06-09 – 2017-06-15 (×13): 325 mg via ORAL
  Filled 2017-06-08 (×13): qty 1

## 2017-06-08 MED ORDER — MAGNESIUM HYDROXIDE 400 MG/5ML PO SUSP: 30.0000 mL | Freq: Every day | ORAL | Status: DC | PRN

## 2017-06-08 MED ORDER — PREDNISONE 20 MG PO TABS
20.0000 mg | ORAL_TABLET | Freq: Every day | ORAL | Status: DC
  Administered 2017-06-09 – 2017-06-12 (×4): 20 mg via ORAL
  Filled 2017-06-08 (×4): qty 1

## 2017-06-08 MED ORDER — METOPROLOL SUCCINATE ER 25 MG PO TB24
25.0000 mg | ORAL_TABLET | Freq: Every day | ORAL | Status: DC
  Administered 2017-06-09 – 2017-06-14 (×6): 25 mg via ORAL
  Filled 2017-06-08 (×6): qty 1

## 2017-06-08 MED ORDER — METRONIDAZOLE IN NACL 5-0.79 MG/ML-% IV SOLN
500.0000 mg | Freq: Three times a day (TID) | INTRAVENOUS | Status: DC
  Administered 2017-06-08 – 2017-06-09 (×2): 500 mg via INTRAVENOUS
  Filled 2017-06-08 (×3): qty 100

## 2017-06-08 MED ORDER — DIPHENHYDRAMINE HCL 25 MG PO CAPS: 25.0000 mg | ORAL_CAPSULE | Freq: Three times a day (TID) | ORAL | Status: DC | PRN

## 2017-06-08 MED ORDER — VANCOMYCIN HCL IN DEXTROSE 1-5 GM/200ML-% IV SOLN
1000.0000 mg | Freq: Once | INTRAVENOUS | Status: AC
  Administered 2017-06-08: 1000 mg via INTRAVENOUS
  Filled 2017-06-08: qty 200

## 2017-06-08 MED ORDER — TIZANIDINE HCL 4 MG PO TABS
2.0000 mg | ORAL_TABLET | Freq: Two times a day (BID) | ORAL | Status: DC
  Administered 2017-06-08 – 2017-06-15 (×14): 2 mg via ORAL
  Filled 2017-06-08 (×14): qty 1

## 2017-06-08 MED ORDER — INSULIN DETEMIR 100 UNIT/ML ~~LOC~~ SOLN
26.0000 [IU] | Freq: Every day | SUBCUTANEOUS | Status: DC
  Administered 2017-06-09: 26 [IU] via SUBCUTANEOUS
  Filled 2017-06-08 (×2): qty 0.26

## 2017-06-08 MED ORDER — GABAPENTIN 300 MG PO CAPS
300.0000 mg | ORAL_CAPSULE | Freq: Three times a day (TID) | ORAL | Status: DC
  Administered 2017-06-08 – 2017-06-15 (×20): 300 mg via ORAL
  Filled 2017-06-08 (×20): qty 1

## 2017-06-08 MED ORDER — OXYCODONE HCL 5 MG PO TABS
30.0000 mg | ORAL_TABLET | Freq: Four times a day (QID) | ORAL | Status: DC
  Administered 2017-06-08: 20 mg via ORAL
  Administered 2017-06-09 – 2017-06-15 (×23): 30 mg via ORAL
  Filled 2017-06-08 (×25): qty 6

## 2017-06-08 MED ORDER — SODIUM CHLORIDE 0.9 % IV SOLN
1.0000 g | Freq: Once | INTRAVENOUS | Status: AC
  Administered 2017-06-09: 1 g via INTRAVENOUS
  Filled 2017-06-08: qty 10

## 2017-06-08 MED ORDER — ACETAMINOPHEN 500 MG PO TABS
1000.0000 mg | ORAL_TABLET | Freq: Once | ORAL | Status: AC
  Administered 2017-06-08: 1000 mg via ORAL
  Filled 2017-06-08: qty 2

## 2017-06-08 MED ORDER — SIMVASTATIN 10 MG PO TABS
10.0000 mg | ORAL_TABLET | Freq: Every day | ORAL | Status: DC
  Administered 2017-06-08 – 2017-06-14 (×7): 10 mg via ORAL
  Filled 2017-06-08 (×7): qty 1

## 2017-06-08 MED ORDER — INSULIN ASPART 100 UNIT/ML ~~LOC~~ SOLN
0.0000 [IU] | Freq: Three times a day (TID) | SUBCUTANEOUS | Status: DC
  Administered 2017-06-10: 3 [IU] via SUBCUTANEOUS
  Administered 2017-06-10: 2 [IU] via SUBCUTANEOUS
  Administered 2017-06-10: 5 [IU] via SUBCUTANEOUS
  Administered 2017-06-11: 2 [IU] via SUBCUTANEOUS
  Administered 2017-06-11: 11 [IU] via SUBCUTANEOUS
  Administered 2017-06-11: 5 [IU] via SUBCUTANEOUS
  Administered 2017-06-12: 3 [IU] via SUBCUTANEOUS
  Administered 2017-06-12: 11 [IU] via SUBCUTANEOUS
  Administered 2017-06-12: 8 [IU] via SUBCUTANEOUS
  Administered 2017-06-13: 11 [IU] via SUBCUTANEOUS
  Administered 2017-06-13 (×2): 8 [IU] via SUBCUTANEOUS
  Administered 2017-06-14: 4 [IU] via SUBCUTANEOUS
  Administered 2017-06-14: 5 [IU] via SUBCUTANEOUS
  Administered 2017-06-15: 15 [IU] via SUBCUTANEOUS
  Administered 2017-06-15: 11 [IU] via SUBCUTANEOUS

## 2017-06-08 MED ORDER — LATANOPROST 0.005 % OP SOLN
1.0000 [drp] | Freq: Every day | OPHTHALMIC | Status: DC
  Administered 2017-06-08 – 2017-06-14 (×7): 1 [drp] via OPHTHALMIC
  Filled 2017-06-08 (×3): qty 2.5

## 2017-06-08 MED ORDER — VANCOMYCIN HCL IN DEXTROSE 1-5 GM/200ML-% IV SOLN
1000.0000 mg | Freq: Two times a day (BID) | INTRAVENOUS | Status: DC
  Administered 2017-06-09 – 2017-06-15 (×13): 1000 mg via INTRAVENOUS
  Filled 2017-06-08 (×11): qty 200

## 2017-06-08 MED ORDER — ENOXAPARIN SODIUM 40 MG/0.4ML ~~LOC~~ SOLN
40.0000 mg | SUBCUTANEOUS | Status: DC
  Administered 2017-06-08 – 2017-06-14 (×7): 40 mg via SUBCUTANEOUS
  Filled 2017-06-08 (×7): qty 0.4

## 2017-06-08 MED ORDER — DEXTROSE 5 % IV SOLN
2.0000 g | Freq: Two times a day (BID) | INTRAVENOUS | Status: DC
  Filled 2017-06-08: qty 2

## 2017-06-08 MED ORDER — INSULIN DETEMIR 100 UNIT/ML ~~LOC~~ SOLN
55.0000 [IU] | Freq: Every day | SUBCUTANEOUS | Status: DC
  Administered 2017-06-09: 55 [IU] via SUBCUTANEOUS
  Filled 2017-06-08 (×2): qty 0.55

## 2017-06-08 MED ORDER — FUROSEMIDE 20 MG PO TABS
20.0000 mg | ORAL_TABLET | Freq: Every day | ORAL | Status: DC
  Administered 2017-06-09 – 2017-06-15 (×7): 20 mg via ORAL
  Filled 2017-06-08 (×7): qty 1

## 2017-06-08 MED ORDER — DAKINS (1/4 STRENGTH) 0.125 % EX SOLN
1.0000 "application " | Freq: Every evening | CUTANEOUS | Status: AC
  Administered 2017-06-10: 1
  Filled 2017-06-08: qty 473

## 2017-06-08 MED ORDER — ALBUTEROL SULFATE (2.5 MG/3ML) 0.083% IN NEBU: 2.5000 mg | INHALATION_SOLUTION | Freq: Four times a day (QID) | RESPIRATORY_TRACT | Status: DC | PRN

## 2017-06-08 MED ORDER — CEFEPIME HCL 2 G IJ SOLR
2.0000 g | Freq: Once | INTRAMUSCULAR | Status: AC
  Administered 2017-06-08: 2 g via INTRAVENOUS
  Filled 2017-06-08: qty 2

## 2017-06-08 NOTE — ED Notes (Signed)
First set of cultures sent to lab. 

## 2017-06-08 NOTE — ED Notes (Signed)
Patient requesting to speak with MD prior to signing blood consent. PA and MD made aware.

## 2017-06-08 NOTE — ED Notes (Signed)
Patient transported to X-ray 

## 2017-06-08 NOTE — ED Triage Notes (Signed)
Per EMS, patient from [facility], where staff reports patient has hemoglobin 6.3 and WBC 19.2. Recently treated for pneumonia. PICC line in right arm.  Patient has no complaints. Denies pain.  5mg  Albuterol with EMS  BP 108/62 HR 108 RR 24 O2 94% RA

## 2017-06-08 NOTE — Progress Notes (Addendum)
Pharmacy Antibiotic Note  Shannon Schmidt is a 75 y.o. female admitted on 06/08/2017 with pneumonia.   Per NH MAR she was recently on IV Cefepime & Vancomycin which changed to oral Levaquin on 06/02/17.  She continues on Levaquin & Prednisone 20mg  for persistent PNA at NH however she has worsened & presents with fever and increased O2 requirements.  PMH is also pertinent for chronic osteomyelitis of foot.  Pharmacy has been consulted for Vancomycin & Cefepime dosing.  06/08/2017:  Tm 101F  WBC 16.2 (ANC 13.5) elevated.  LA WNL.  Renal function at baseline.  NCrCl~44ml/min (with rounding Scr to 0.85 for age)  Plan: Cefepime 2gm IV q12h Vancomycin 1gm IV q12h (goal trough 15-82mcg/ml) Check Vancomycin trough at steady state Monitor renal function and cx data      Temp (24hrs), Avg:99.5 F (37.5 C), Min:99.5 F (37.5 C), Max:99.5 F (37.5 C)  No results for input(s): WBC, CREATININE, LATICACIDVEN, VANCOTROUGH, VANCOPEAK, VANCORANDOM, GENTTROUGH, GENTPEAK, GENTRANDOM, TOBRATROUGH, TOBRAPEAK, TOBRARND, AMIKACINPEAK, AMIKACINTROU, AMIKACIN in the last 168 hours.  CrCl cannot be calculated (Patient's most recent lab result is older than the maximum 21 days allowed.).    Allergies  Allergen Reactions  . Ace Inhibitors Other (See Comments)    Unknown   . Chlorpromazine Other (See Comments)    Unknown  . Haloperidol Decanoate Other (See Comments)    Unknown    Antimicrobials this admission: 7/12 Vanc >>  7/12 Cefepime >>   Dose adjustments this admission:  Microbiology results: 7/12 BCx: ordered 7/12 UCx:    Thank you for allowing pharmacy to be a part of this patient's care.  Biagio Borg 06/08/2017 4:00 PM   Addendum: Admitting MD changing Cefepime/Flagyl to Zosyn for better anaerobic coverage-->possible aspiration + diabetic infection as possible source of infection. Zosyn 3.375gm IV Q8h to be infused over [place], PharmD,  BCPS 06/08/2017@10 :28 PM

## 2017-06-08 NOTE — ED Notes (Signed)
Bed: HO88 Expected date:  Expected time:  Means of arrival:  Comments: EMS-low HgB

## 2017-06-08 NOTE — H&P (Signed)
History and Physical    Shannon Schmidt EQA:[id number] DOB: July 03, 1941 DOA: 06/08/2017  PCP: [facility] (has been there since 11/11) Consultants:  ? Patient coming from: [facility]; [facility]: son or daughter, Shannon Schmidt: [phone], son; [phone], daughter, Shannon Schmidt  Chief Complaint: low hemoglobin  HPI: Shannon Schmidt is a 76 y.o. female with medical history significant of COPD, DM, and osteomyelitis presenting from SNF with persistent pneumonia x 3-4 weeks.  She is currently requiring 5L O2 and does not usually have an O2 requirement; she is also requiring increased nebs.  Cough has not improved, keeps coming and going.  Cough is productive of yellowish phlegm.  She has previously been treated with Cefepime  and Vancomycin x 1 week several weeks ago and more recently was started on Levaquin from 7/8-7/18 (scheduled).  She is also on a prednisone taper, started 6/24 for 20 days.  +fevers to 101.  Also with chronic osteomyelitis of feet.  "It's been there for a while now."  She was found to have Hgb 6.3 at SNF today and was sent for transfusion.    She has chronic sacral pressure ulcers and urinary incontinence.  She has an ostomy.  ED Course: Concern for sepsis, order set initiated.  Given Cefepime and Vanc.  Blood transfusion initiated.  Review of Systems: As per HPI; otherwise review of systems reviewed and negative. This is limited by patient's ability to answer questions fully.  Ambulatory Status:  Non-ambulatory; has been wheelchair-bound for 17 years  Past Medical History:  Diagnosis Date  . Anemia   . Bipolar 1 disorder ([place])   . Colostomy in place [facility])   . COPD (chronic obstructive pulmonary disease) ([place])   . Depression   . Diabetes mellitus   . GERD (gastroesophageal reflux disease)   . H/O hiatal hernia   . Muscle weakness   . Neuropathy   . Osteomyelitis ([place])   . Paraplegia ([place])   . PONV (postoperative nausea and vomiting)   . RA  (rheumatoid arthritis) ([place])   . Schizophrenia [facility])     Past Surgical History:  Procedure Laterality Date  . ABDOMINAL HYSTERECTOMY    . BREAST SURGERY     L breast abscess  . CATARACT EXTRACTION W/PHACO  05/09/2012   Procedure: CATARACT EXTRACTION PHACO AND INTRAOCULAR LENS PLACEMENT (IOC);  Surgeon: Marylynn Pearson, MD;  Location: [facility];  Service: Ophthalmology;  Laterality: Right;  . COLON SURGERY    . EYE SURGERY     L cataract  . GANGLION CYST EXCISION     R wrist x2  . HERNIA REPAIR     abdominal  . JOINT REPLACEMENT     knee  . KNEE SURGERY    . WOUND DEBRIDEMENT      Social History   Social History  . Marital status: Divorced    Spouse name: N/A  . Number of children: N/A  . Years of education: N/A   Occupational History  . retired    Social History Main Topics  . Smoking status: Former Smoker    Packs/day: 2.00    Years: 40.00    Quit date: 2006  . Smokeless tobacco: Never Used  . Alcohol use No  . Drug use: No  . Sexual activity: Not on file   Other Topics Concern  . Not on file   Social History Narrative  . No narrative on file    Allergies  Allergen Reactions  . Ace Inhibitors Other (See Comments)  Unknown   . Chlorpromazine Other (See Comments)    Unknown  . Haloperidol Decanoate Other (See Comments)    Unknown    Family History  Problem Relation Age of Onset  . Hypertension Mother   . Diabetes type II Mother   . Stroke Father   . Cancer Other     Prior to Admission medications   Medication Sig Start Date End Date Taking? Authorizing Provider  acetaminophen (MAPAP) 325 MG tablet Take by mouth 2 (two) times daily. For pain or fever.    [provider]  acetaminophen (TYLENOL) 500 MG tablet Take 500 mg by mouth every 4 (four) hours as needed. For pain/discomfort.    [provider]  aztreonam 1 g in dextrose 5 % 50 mL Inject 1 g into the vein every 8 (eight) hours. 03/16/14   Velvet Bathe, MD  bimatoprost  (LUMIGAN) 0.01 % SOLN Place 1 drop into both eyes at bedtime. Both eyes.    [provider]  diclofenac sodium (VOLTAREN) 1 % GEL Apply 4 g topically 4 (four) times daily. Right wrist and knee    [provider]  diphenhydrAMINE (BENADRYL) 25 mg capsule Take 25 mg by mouth every 4 (four) hours as needed. For itching and/or swelling.    [provider]  insulin detemir (LEVEMIR) 100 UNIT/ML injection Inject 24-55 Units into the skin 2 (two) times daily. 24 in am and 55 in pm    [provider]  insulin lispro (HUMALOG) 100 UNIT/ML injection Inject 0-10 Units into the skin 2 (two) times daily. 61-150 0 units 151-200 2 units 201-250 4 units 251-300 6 units 301-350 8 units 351-400 10 units    [provider]  loratadine (CLARITIN) 10 MG tablet Take 10 mg by mouth every morning.     [provider]  magnesium hydroxide (MILK OF MAGNESIA) 400 MG/5ML suspension Take 30 mLs by mouth daily as needed for mild constipation.    [provider]  metFORMIN (GLUCOPHAGE) 500 MG tablet Take 500 mg by mouth daily with breakfast.    [provider]  omeprazole (PRILOSEC) 20 MG capsule Take 20 mg by mouth daily as needed. For heartburn.    [provider]  oxyCODONE (ROXICODONE) 15 MG immediate release tablet Take 15 mg by mouth every 6 (six) hours as needed for pain.    [provider]  risperiDONE (RISPERDAL) 1 MG tablet Take 1.5 mg by mouth 2 (two) times daily.    [provider]  simethicone (MI-ACID GAS RELIEF) 80 MG chewable tablet Chew 80 mg by mouth every 12 (twelve) hours as needed. For gas.    [provider]  simvastatin (ZOCOR) 10 MG tablet Take 10 mg by mouth at bedtime.    [provider]  tiZANidine (ZANAFLEX) 2 MG tablet Take 2 mg by mouth 2 (two) times daily.    [provider]  vitamin C (ASCORBIC ACID) 500 MG tablet Take 500 mg by mouth 2 (two) times daily.    [provider]    Physical Exam: Vitals:   06/08/17 2000 06/08/17 2003 06/08/17 2015 06/08/17 2133  BP: 122/60 110/63 117/69   Pulse: 92 94 98   Resp: 20 19 (!) 23   Temp:  98.7 F (37.1 C)  98.3 F (36.8 C)  TempSrc:  Oral  Oral  SpO2: 92% 95% 90%   Weight:         General: Appears calm and comfortable and is NAD; poor  historian Eyes:  PERRL, EOMI, normal lids, iris ENT:  grossly normal hearing, mmm, thrush noted underneath tongue Neck:  no LAD, masses or thyromegaly Cardiovascular:  RRR, no m/r/g. No LE edema.  Respiratory:  Diffusely rhonchorous breath sounds with a very coarse cough. Normal respiratory effort. Abdomen:  soft, ntnd, NABS, ostomy with brown stool in bag Skin:  Foot ulcers on the bilateral great toes; nurse also reports sacral pressure ulcers, per EDP several chronic stage 3-4 sacral ulcers without signs of infection Musculoskeletal:  grossly normal tone BUE/BLE, good ROM, no bony abnormality Psychiatric: grossly normal Schmidt and affect, speech fluent and appropriate, AOx3; became emotionally labile when talking about code status Neurologic:  CN 2-12 grossly intact  Labs on Admission: I have personally reviewed following labs and imaging studies  CBC:  Recent Labs Lab 06/08/17 1617 06/08/17 1629  WBC 16.2*  --   NEUTROABS 13.5*  --   HGB 6.8* 7.5*  HCT 22.3* 22.0*  MCV 70.6*  --   PLT 405*  --    Basic Metabolic Panel:  Recent Labs Lab 06/08/17 1617 06/08/17 1629  NA 133* 131*  K 5.1 6.0*  CL 96* 94*  CO2 29  --   GLUCOSE 83 88  BUN 12 14  CREATININE 0.71 0.60  CALCIUM 7.5*  --    GFR: CrCl cannot be calculated (Unknown ideal weight.). Liver Function Tests:  Recent Labs Lab 06/08/17 1617  AST 42*  ALT 21  ALKPHOS 70  BILITOT 0.8  PROT 7.2  ALBUMIN 2.1*   No results for input(s): LIPASE, AMYLASE in the last 168 hours. No results for input(s): AMMONIA in the last 168 hours. Coagulation Profile: No results for input(s): INR,  PROTIME in the last 168 hours. Cardiac Enzymes: No results for input(s): CKTOTAL, CKMB, CKMBINDEX, TROPONINI in the last 168 hours. BNP (last 3 results) No results for input(s): PROBNP in the last 8760 hours. HbA1C: No results for input(s): HGBA1C in the last 72 hours. CBG: No results for input(s): GLUCAP in the last 168 hours. Lipid Profile: No results for input(s): CHOL, HDL, LDLCALC, TRIG, CHOLHDL, LDLDIRECT in the last 72 hours. Thyroid Function Tests: No results for input(s): TSH, T4TOTAL, FREET4, T3FREE, THYROIDAB in the last 72 hours. Anemia Panel: No results for input(s): VITAMINB12, FOLATE, FERRITIN, TIBC, IRON, RETICCTPCT in the last 72 hours. Urine analysis:    Component Value Date/Time   COLORURINE STRAW (A) 06/08/2017 1552   APPEARANCEUR CLEAR 06/08/2017 1552   LABSPEC 1.002 (L) 06/08/2017 1552   PHURINE 6.0 06/08/2017 1552   GLUCOSEU NEGATIVE 06/08/2017 1552   HGBUR SMALL (A) 06/08/2017 1552   BILIRUBINUR NEGATIVE 06/08/2017 1552   KETONESUR NEGATIVE 06/08/2017 1552   PROTEINUR NEGATIVE 06/08/2017 1552   UROBILINOGEN 0.2 03/09/2014 1932   NITRITE NEGATIVE 06/08/2017 1552   LEUKOCYTESUR TRACE (A) 06/08/2017 1552    Creatinine Clearance: CrCl cannot be calculated (Unknown ideal weight.).  Sepsis Labs: @LABRCNTIP (procalcitonin:4,lacticidven:4) )No results found for this or any previous visit (from the past 240 hour(s)).   Radiological Exams on Admission: Dg Chest 2 View  Result Date: 06/08/2017 CLINICAL DATA:  Leukocytosis, recent treatment for pneumonia. History of diabetes, paraplegia. EXAM: CHEST  2 VIEW COMPARISON:  Chest radiograph March 09, 2014 FINDINGS: Dense consolidation RIGHT upper lobe. No pleural effusion. Cardiac silhouette is normal in size, calcified aortic knob. Apical pleural capping. No pneumothorax. RIGHT PICC distal tip projects in proximal superior vena cava. Moderate degenerative change of the thoracic spine. IMPRESSION: RIGHT upper lobe  pneumonia.  Followup PA and lateral chest X-ray is recommended in 3-4 weeks following trial of antibiotic therapy to ensure resolution and exclude underlying malignancy. Electronically Signed   By: Elon Alas M.D.   On: 06/08/2017 17:30   Dg Foot Complete Left  Result Date: 06/08/2017 CLINICAL DATA:  Black great toe.  History of diabetes, paraplegia. EXAM: LEFT FOOT - COMPLETE 3+ VIEW COMPARISON:  None. FINDINGS: There is no evidence of fracture or dislocation. Thickened fifth metatarsus diaphysis suggests old fracture. Severe osteopenia. There is no evidence of arthropathy or other focal bone abnormality. Soft tissue swelling. Mild vascular calcifications. IMPRESSION: Severe osteopenia without acute osseous process. Soft tissue swelling. Electronically Signed   By: Elon Alas M.D.   On: 06/08/2017 17:34   Dg Foot Complete Right  Result Date: 06/08/2017 CLINICAL DATA:  Black great toe EXAM: RIGHT FOOT COMPLETE - 3+ VIEW COMPARISON:  None FINDINGS: Marked osseous demineralization. Hallux valgus. Prior resection of the PIP joint second toe. Cortical integrity at the medial aspect tuft of distal phalanx appears lost concerning for osteomyelitis. No acute fracture, dislocation or additional bone destruction. Diffuse soft tissue swelling especially at the dorsum of the foot and anterior ankle. Scattered small vessel vascular calcifications. IMPRESSION: Suspected bone destruction at the medial aspect, tuft of distal phalanx LEFT great toe concerning for osteomyelitis. Electronically Signed   By: Lavonia Dana M.D.   On: 06/08/2017 17:32    EKG: Pending  Assessment/Plan Principal Problem:   Sepsis ([place]) Active Problems:   Diabetes mellitus ([place])   Decubitus ulcers   HTN (hypertension)   Anemia   HCAP (healthcare-associated pneumonia)   Osteomyelitis (HCC)   Oral thrush   Severe malnutrition ([place])   Sepsis, likely due to HCAP but possibly also related to osteomyelitis --Elevated WBC  count (16.2), fever, tachycardia with normal lactate -While awaiting blood cultures, this appears to be a preseptic condition. -Sepsis protocol initiated -Given productive cough, fever to 101, decreased oxygen saturation (not on home O2 but now requiring 5L [place] O2), and infiltrate in right upper lobe on chest x-ray , most likely pneumonia. -This may be aspiration related given the upper lobe location and recurrent/refractory infection. -Will request speech therapy swallow evaluation.  -Given the fact that the patient has been recently treated for HCAP with Cefepime and Vanc and Prednisone and started levaquin about 3 days ago, the idea of an MDR pathogen is concerning.  Will place on Zosyn and Vanc for now to cover aspiration and HCAP.   -Suggest ID consultation in AM. -CURB-65 score is 2 - will admit the patient to SDU given high oxygen requirement.. -Pneumonia Severity Index (PSI) is Class 4, Score 124, 9.3% mortality. -Corticosteroids have been to shown to low overall mortality rate; risk of ARDS; and need for mechanical ventilation.  This is particularly true in severe PNA (class 3+ PSI).  Since the patient has already been given a prolonged steroid taper, will not resume them at this time. -The patient has the following criteria for MDR (multi-drug resistance): IV antibiotics in the last 90 days; SNF placement. -The patient will need treatment for HCAP due to MDR risk factors as above; will treat with Zosyn and Vancomycin to also cover aspiration, as above. -Additional complicating factors include: hypoxia; and failure of outpatient antibiotics (both parenteral and PO). -NS @ 75cc/hr -Fever control -Repeat CBC in am -Sputum cultures -Strep pneumo testing -Will order icu procalcitonin levels.  >0.5 indicates infection and >>0.5 indicates more serious disease.  As the procalcitonin level  normalizes, it will be reasonable to consider de-escalation of antibiotic coverage. -albuterol  PRN -Standing Duonebs -UA: no bacteria, small Hgb, trace LE, 0-5 WBC -Blood and urine cultures pending  Osteomyelitis -Patient with apparent chronic foot infections -Will add Flagyl to above antibiotics per diabetic foot ulcer order set -Needs consult by orthopedics tomorrow -Diabetic foot infection order set utilized, including orders for CM, SW, DM coordinator, wound care, and nutrition consult. -Goal would be for glucose <150 to facilitate wound healing. -Patient is chronically on bed rest, non-weight bearing. -Excellent BP control is needed   Hypocalcemia -Calcium 7.5; ionized calcium 0.91 -Will replete with calcium gluconate -Recheck calcium in AM -Check magnesium level -With her very low albumin, it may be difficult to correct her calcium  Malnutrition -Albumin 2.1 -Nutrition consult  Anemia -Hgb 6.8; prior 9.9 in 2015 -Heme negative -MCV is <80 and so this is microcytic anemia -MCV is 70.6, indicating probable iron deficiency -With ongoing question, will check ferritin; if <15, diagnostic of iron deficiency; if >100, very unlikely iron deficiency -If ferritin is non-diagnostic, will check transferrin saturation - the lower it is, the more likely this is iron deficiency anemia -She takes chronic iron at home -Transfusion of 2 units PRBC initiated in ER -Will recheck CBC in AM  Oral thrush -Fluconazole 200 mg PO daily  DVT prophylaxis: Lovenox  Code Status:  DNR - confirmed with patient/family Family Communication: I spoke with both her children via telephone.  Her daughter, particularly, requests to be updated daily. Disposition Plan:  Back to SNF once clinically improved Consults called: Suggest ID and orthopedics; CM, SW, DM coordinator, wound care, speech therapy, nutrition consult. Admission status: Admit - It is my clinical opinion that admission to INPATIENT is reasonable and necessary because this patient will require at least 2 midnights in the hospital to  treat this condition based on the medical complexity of the problems presented.  Given the aforementioned information, the predictability of an adverse outcome is felt to be significant.   Total critical care time: 105 minutes Critical care time was exclusive of separately billable procedures and treating other patients. Critical care was necessary to treat or prevent imminent or life-threatening deterioration. Critical care was time spent personally by me on the following activities: development of treatment plan with patient and/or surrogate as well as nursing, discussions with consultants, evaluation of patient's response to treatment, examination of patient, obtaining history from patient or surrogate, ordering and performing treatments and interventions, ordering and review of laboratory studies, ordering and review of radiographic studies, pulse oximetry and re-evaluation of patient's condition.    Karmen Bongo MD [facility]  If 7PM-7AM, please contact night-coverage [email or web address] Password [facility]  06/08/2017, 9:50 PM

## 2017-06-08 NOTE — ED Provider Notes (Signed)
[place] DEPT Provider Note   CSN: [id number] Arrival date & time: 06/08/17  1532     History   Chief Complaint Chief Complaint  Patient presents with  . Abnormal Lab    HPI Shannon Schmidt is a 76 y.o. female with history of COPD, diabetes, osteomyelitis who has recently been treated for pneumonia and her skilled nursing facility, [facility], who presents with fever, low hemoglobin, new oxygen requirement. Patient not normally on oxygen prior to today. Patient has been treated with Levaquin and vancomycin at [facility]. Patient continues to have cough, shortness of breath. Patient notes she has had diarrhea for the past 2 or 3 weeks. She has not seen any blood. She has a colostomy. Patient comes with medical record showing a hemoglobin of 6.3 and WBC 19.2 today. Patient denies any chest pain, urinary symptoms. Patient is incontinent with briefs.  I spoke with the patient's nurse, Chriss Czar, who reported that the patient was on IV Vancomycin, cefepime a couple weeks ago for one 1 week. Patient began developing high temps and meds were d/c'd beause course was completed and and then oral levaquin started at least 3 days. It is scheduled through 06/14/2017. Patient has had increased confusion, not letting staff do dressing changes on chronic sacral decubitus ulcers. Oxygen level has decreased over the past few days and patient has been needing 3-4 L O2 and PRN breathing treatments more frequently. Patient also on Prednisone taper since 6/24 for 20 days. Wound doctor has great toes noted and was treating, but not anymore.    HPI  Past Medical History:  Diagnosis Date  . Anemia   . Bipolar 1 disorder ([place])   . Colostomy in place [facility])   . COPD (chronic obstructive pulmonary disease) ([place])   . Depression   . Diabetes mellitus   . GERD (gastroesophageal reflux disease)   . H/O hiatal hernia   . Muscle weakness   . Neuropathy   . Osteomyelitis ([place])   .  Paraplegia ([place])   . PONV (postoperative nausea and vomiting)   . RA (rheumatoid arthritis) ([place])   . Schizophrenia [facility])     Patient Active Problem List   Diagnosis Date Noted  . Sepsis ([place]) 06/08/2017  . HCAP (healthcare-associated pneumonia) 06/08/2017  . Osteomyelitis ([place]) 06/08/2017  . Oral thrush 06/08/2017  . Severe malnutrition ([place]) 06/08/2017  . Hypocalcemia 06/08/2017  . HTN (hypertension) 03/10/2014  . Leukocytosis, unspecified 03/10/2014  . Anemia 03/10/2014  . Hyponatremia 03/10/2014  . Dyslipidemia 03/10/2014  . Pyelonephritis due to Escherichia coli 03/09/2014  . Ileus ([place]) 03/09/2014  . Diabetes mellitus ([place]) 03/09/2014  . Decubitus ulcers 03/09/2014    Past Surgical History:  Procedure Laterality Date  . ABDOMINAL HYSTERECTOMY    . BREAST SURGERY     L breast abscess  . CATARACT EXTRACTION W/PHACO  05/09/2012   Procedure: CATARACT EXTRACTION PHACO AND INTRAOCULAR LENS PLACEMENT (IOC);  Surgeon: Marylynn Pearson, MD;  Location: [place];  Service: Ophthalmology;  Laterality: Right;  . COLON SURGERY    . EYE SURGERY     L cataract  . GANGLION CYST EXCISION     R wrist x2  . HERNIA REPAIR     abdominal  . JOINT REPLACEMENT     knee  . KNEE SURGERY    . WOUND DEBRIDEMENT      OB History    No data available       Home Medications    Prior to  Admission medications   Medication Sig Start Date End Date Taking? Authorizing Provider  acetaminophen (MAPAP) 325 MG tablet Take 650 mg by mouth every 8 (eight) hours as needed for mild pain or fever. For pain or fever.    Yes [provider]  albuterol (PROVENTIL) (2.5 MG/3ML) 0.083% nebulizer solution Take 2.5 mg by nebulization every 6 (six) hours as needed for wheezing or shortness of breath.   Yes [provider]  divalproex (DEPAKOTE ER) 250 MG 24 hr tablet Take 250 mg by mouth at bedtime.   Yes [provider]  ferrous sulfate 325 (65 FE) MG tablet Take 325 mg by mouth 2 (two)  times daily with a meal.   Yes [provider]  furosemide (LASIX) 20 MG tablet Take 20 mg by mouth daily.   Yes [provider]  gabapentin (NEURONTIN) 300 MG capsule Take 300 mg by mouth 3 (three) times daily.   Yes [provider]  insulin detemir (LEVEMIR) 100 UNIT/ML injection Inject 26-55 Units into the skin 2 (two) times daily. Inject 24units at 9a and 55 units at 5pm   Yes [provider]  insulin lispro (HUMALOG) 100 UNIT/ML injection Inject 0-10 Units into the skin 2 (two) times daily. 61-150 0 units 151-200 2 units 201-250 4 units 251-300 6 units 301-350 8 units 351-400 10 units   Yes [provider]  ipratropium-albuterol (DUONEB) 0.5-2.5 (3) MG/3ML SOLN Take 3 mLs by nebulization 4 (four) times daily.   Yes [provider]  latanoprost (XALATAN) 0.005 % ophthalmic solution Place 1 drop into both eyes at bedtime.   Yes [provider]  levofloxacin (LEVAQUIN) 750 MG tablet Take 750 mg by mouth daily. Started 06/02/17.  Originally ordered x7days, but then changed until pneumonia resolves.   Yes [provider]  loratadine (CLARITIN) 10 MG tablet Take 10 mg by mouth every morning.    Yes [provider]  magnesium hydroxide (MILK OF MAGNESIA) 400 MG/5ML suspension Take 30 mLs by mouth daily as needed for mild constipation.   Yes [provider]  metFORMIN (GLUCOPHAGE) 500 MG tablet Take 500 mg by mouth daily with breakfast.   Yes [provider]  metoprolol succinate (TOPROL-XL) 25 MG 24 hr tablet Take 25 mg by mouth daily.   Yes [provider]  Multiple Vitamin (MULTIVITAMIN WITH MINERALS) TABS tablet Take 1 tablet by mouth daily.   Yes [provider]  omeprazole (PRILOSEC) 20 MG capsule Take 20 mg by mouth 2 (two) times daily before a meal. For heartburn.   Yes [provider]  oxycodone (ROXICODONE) 30 MG immediate release tablet Take 30 mg by mouth every 6  (six) hours.   Yes [provider]  predniSONE (DELTASONE) 20 MG tablet Take 20 mg by mouth daily with breakfast. Ordered for 20 days to complete on 7/16 then decrease to 10mg  for 1 day   Yes [provider]  risperiDONE (RISPERDAL) 1 MG tablet Take 1.5 mg by mouth at bedtime.    Yes [provider]  simethicone (MI-ACID GAS RELIEF) 80 MG chewable tablet Chew 80 mg by mouth every 12 (twelve) hours as needed. For gas.   Yes [provider]  simvastatin (ZOCOR) 10 MG tablet Take 10 mg by mouth at bedtime.   Yes [provider]  sodium hypochlorite (DAKIN'S 1/4 STRENGTH) 0.125 % SOLN Irrigate with 1 application as directed every evening. Apply to left sacrum at dressing changes nightly   Yes [provider]  tiZANidine (ZANAFLEX) 2 MG tablet Take 2 mg by mouth 2 (two) times daily.   Yes [provider]  Vitamin D, Ergocalciferol, (DRISDOL) 50000 units CAPS capsule Take 50,000 Units by mouth every 30 (thirty) days.   Yes [provider]  diphenhydrAMINE (BENADRYL) 25 mg capsule Take 25 mg by mouth every 8 (eight) hours as needed for itching. For itching and/or swelling.    [provider]    Family History Family History  Problem Relation Age of Onset  . Hypertension Mother   . Diabetes type II Mother   . Stroke Father   . Cancer Other     Social History Social History  Substance Use Topics  . Smoking status: Former Smoker    Packs/day: 2.00    Years: 40.00    Quit date: 2006  . Smokeless tobacco: Never Used  . Alcohol use No     Allergies   Ace inhibitors; Chlorpromazine; and Haloperidol decanoate   Review of Systems Review of Systems  Constitutional: Negative for chills and fever.  HENT: Negative for facial swelling and sore throat.   Respiratory: Positive for cough and shortness of breath.   Cardiovascular: Negative for chest pain.  Gastrointestinal: Positive for abdominal pain and diarrhea.  Negative for blood in stool, nausea and vomiting.  Genitourinary: Negative for dysuria.  Musculoskeletal: Negative for back pain.  Skin: Negative for rash and wound.  Neurological: Negative for headaches.  Psychiatric/Behavioral: The patient is not nervous/anxious.      Physical Exam Updated Vital Signs BP (!) 103/49   Pulse 87   Temp 98.4 F (36.9 C) (Oral)   Resp 19   Wt 89.4 kg (197 lb)   SpO2 94%   BMI 28.27 kg/m   Physical Exam  Constitutional: She appears well-developed and well-nourished. No distress.  HENT:  Head: Normocephalic and atraumatic.  Mouth/Throat: Oropharynx is clear and moist. No oropharyngeal exudate.  Eyes: Pupils are equal, round, and reactive to light. Conjunctivae are normal. Right eye exhibits no discharge. Left eye exhibits no discharge. No scleral icterus.  Pale conjunctiva  Neck: Normal range of motion. Neck supple. No thyromegaly present.  Cardiovascular: Normal rate, regular rhythm, normal heart sounds and intact distal pulses.  Exam reveals no gallop and no friction rub.   No murmur heard. Pulmonary/Chest: Effort normal. No stridor. No respiratory distress. She has decreased breath sounds. She has wheezes. She has rhonchi. She has no rales.  Abdominal: Soft. Bowel sounds are normal. She exhibits no distension. There is no tenderness. There is no rebound and no guarding.  Colostomy stoma with loose stool in bag, no signs of infection  Musculoskeletal: She exhibits no edema.  Bilateral great toes have 2x2 cm area of blackened skin; decreased sensation (patient with neuropathy)  Lymphadenopathy:    She has no cervical adenopathy.  Neurological: She is alert. Coordination normal.  Skin: Skin is warm and dry. No rash noted. She is not diaphoretic. No pallor.  Several chronic stage 3-4 sacral decubitus ulcers (pt reports she has had them for 12 years)- no signs of infection  Psychiatric: She has a normal mood and affect.  Nursing note and vitals  reviewed.    ED Treatments / Results  Labs (all labs ordered are listed, but only abnormal results are displayed) Labs Reviewed  COMPREHENSIVE METABOLIC PANEL - Abnormal; Notable for the following:       Result Value   Sodium 133 (*)    Chloride 96 (*)  Calcium 7.5 (*)    Albumin 2.1 (*)    AST 42 (*)    All other components within normal limits  CBC WITH DIFFERENTIAL/PLATELET - Abnormal; Notable for the following:    WBC 16.2 (*)    RBC 3.16 (*)    Hemoglobin 6.8 (*)    HCT 22.3 (*)    MCV 70.6 (*)    MCH 21.5 (*)    RDW 20.1 (*)    Platelets 405 (*)    Neutro Abs 13.5 (*)    Monocytes Absolute 1.5 (*)    All other components within normal limits  URINALYSIS, ROUTINE W REFLEX MICROSCOPIC - Abnormal; Notable for the following:    Color, Urine STRAW (*)    Specific Gravity, Urine 1.002 (*)    Hgb urine dipstick SMALL (*)    Leukocytes, UA TRACE (*)    All other components within normal limits  I-STAT CHEM 8, ED - Abnormal; Notable for the following:    Sodium 131 (*)    Potassium 6.0 (*)    Chloride 94 (*)    Calcium, Ion 0.91 (*)    Hemoglobin 7.5 (*)    HCT 22.0 (*)    All other components within normal limits  MRSA PCR SCREENING  CULTURE, BLOOD (ROUTINE X 2)  CULTURE, BLOOD (ROUTINE X 2)  URINE CULTURE  CULTURE, EXPECTORATED SPUTUM-ASSESSMENT  GLUCOSE, CAPILLARY  OCCULT BLOOD X 1 CARD TO LAB, STOOL  LACTIC ACID, PLASMA  LACTIC ACID, PLASMA  PROCALCITONIN  PROTIME-INR  APTT  STREP PNEUMONIAE URINARY ANTIGEN  BASIC METABOLIC PANEL  CBC WITH DIFFERENTIAL/PLATELET  HEMOGLOBIN A1C  HIV ANTIBODY (ROUTINE TESTING)  SEDIMENTATION RATE  C-REACTIVE PROTEIN  PREALBUMIN  MAGNESIUM  PROCALCITONIN  FERRITIN  I-STAT CG4 LACTIC ACID, ED  POC OCCULT BLOOD, ED  TYPE AND SCREEN  PREPARE RBC (CROSSMATCH)    EKG  EKG Interpretation None       Radiology Dg Chest 2 View  Result Date: 06/08/2017 CLINICAL DATA:  Leukocytosis, recent treatment for pneumonia.  History of diabetes, paraplegia. EXAM: CHEST  2 VIEW COMPARISON:  Chest radiograph March 09, 2014 FINDINGS: Dense consolidation RIGHT upper lobe. No pleural effusion. Cardiac silhouette is normal in size, calcified aortic knob. Apical pleural capping. No pneumothorax. RIGHT PICC distal tip projects in proximal superior vena cava. Moderate degenerative change of the thoracic spine. IMPRESSION: RIGHT upper lobe pneumonia. Followup PA and lateral chest X-ray is recommended in 3-4 weeks following trial of antibiotic therapy to ensure resolution and exclude underlying malignancy. Electronically Signed   By: Elon Alas M.D.   On: 06/08/2017 17:30   Dg Foot Complete Left  Result Date: 06/08/2017 CLINICAL DATA:  Black great toe.  History of diabetes, paraplegia. EXAM: LEFT FOOT - COMPLETE 3+ VIEW COMPARISON:  None. FINDINGS: There is no evidence of fracture or dislocation. Thickened fifth metatarsus diaphysis suggests old fracture. Severe osteopenia. There is no evidence of arthropathy or other focal bone abnormality. Soft tissue swelling. Mild vascular calcifications. IMPRESSION: Severe osteopenia without acute osseous process. Soft tissue swelling. Electronically Signed   By: Elon Alas M.D.   On: 06/08/2017 17:34   Dg Foot Complete Right  Result Date: 06/08/2017 CLINICAL DATA:  Black great toe EXAM: RIGHT FOOT COMPLETE - 3+ VIEW COMPARISON:  None FINDINGS: Marked osseous demineralization. Hallux valgus. Prior resection of the PIP joint second toe. Cortical integrity at the medial aspect tuft of distal phalanx appears lost concerning for osteomyelitis. No acute fracture, dislocation or additional bone destruction. Diffuse  soft tissue swelling especially at the dorsum of the foot and anterior ankle. Scattered small vessel vascular calcifications. IMPRESSION: Suspected bone destruction at the medial aspect, tuft of distal phalanx LEFT great toe concerning for osteomyelitis. Electronically Signed   By:  Lavonia Dana M.D.   On: 06/08/2017 17:32    Procedures Procedures (including critical care time)  CRITICAL CARE Performed by: Frederica Kuster   Total critical care time: 30 minutes  Critical care time was exclusive of separately billable procedures and treating other patients.  Critical care was necessary to treat or prevent imminent or life-threatening deterioration.  Critical care was time spent personally by me on the following activities: development of treatment plan with patient and/or surrogate as well as nursing, discussions with consultants, evaluation of patient's response to treatment, examination of patient, obtaining history from patient or surrogate, ordering and performing treatments and interventions, ordering and review of laboratory studies, ordering and review of radiographic studies, pulse oximetry and re-evaluation of patient's condition.  Sepsis - Repeat Assessment  Performed at:    [place]     Blood pressure (!) 103/49, pulse 87, temperature 98.4 F (36.9 C), temperature source Oral, resp. rate 19, weight 89.4 kg (197 lb), SpO2 94 %.  Heart:     Tachycardic  Lungs:    decreased bilaterally  Capillary Refill:   <2 sec  Peripheral Pulse:   Radial pulse palpable  Skin:     Normal Color      Medications Ordered in ED Medications  enoxaparin (LOVENOX) injection 40 mg (40 mg Subcutaneous Given 06/08/17 2224)  0.9 %  sodium chloride infusion ( Intravenous New Bag/Given 06/08/17 2319)  insulin aspart (novoLOG) injection 0-15 Units (not administered)  metroNIDAZOLE (FLAGYL) IVPB 500 mg (0 mg Intravenous Stopped 06/09/17 0021)  albuterol (PROVENTIL) (2.5 MG/3ML) 0.083% nebulizer solution 2.5 mg (not administered)  diphenhydrAMINE (BENADRYL) capsule 25 mg (not administered)  divalproex (DEPAKOTE ER) 24 hr tablet 250 mg (250 mg Oral Given 06/08/17 2216)  ferrous sulfate tablet 325 mg (not administered)  furosemide (LASIX) tablet 20 mg (not administered)    gabapentin (NEURONTIN) capsule 300 mg (300 mg Oral Given 06/08/17 2216)  ipratropium-albuterol (DUONEB) 0.5-2.5 (3) MG/3ML nebulizer solution 3 mL (0 mLs Nebulization Duplicate 7/35/32 9924)  latanoprost (XALATAN) 0.005 % ophthalmic solution 1 drop (1 drop Both Eyes Given 06/08/17 2219)  loratadine (CLARITIN) tablet 10 mg (not administered)  magnesium hydroxide (MILK OF MAGNESIA) suspension 30 mL (not administered)  metoprolol succinate (TOPROL-XL) 24 hr tablet 25 mg (not administered)  pantoprazole (PROTONIX) EC tablet 40 mg (not administered)  oxyCODONE (Oxy IR/ROXICODONE) immediate release tablet 30 mg (20 mg Oral Given 06/08/17 2216)  predniSONE (DELTASONE) tablet 20 mg (not administered)  risperiDONE (RISPERDAL) tablet 1.5 mg (1.5 mg Oral Given 06/08/17 2218)  simvastatin (ZOCOR) tablet 10 mg (10 mg Oral Given 06/08/17 2216)  sodium hypochlorite (DAKIN'S 1/4 STRENGTH) topical solution 1 application (1 application Irrigation Not Given 06/08/17 2300)  tiZANidine (ZANAFLEX) tablet 2 mg (2 mg Oral Given 06/08/17 2219)  vancomycin (VANCOCIN) IVPB 1000 mg/200 mL premix (not administered)  sodium chloride flush (NS) 0.9 % injection 10-40 mL (not administered)  Chlorhexidine Gluconate Cloth 2 % PADS 6 each (6 each Topical Given 06/08/17 2145)  insulin detemir (LEVEMIR) injection 26 Units ( Subcutaneous Canceled Entry 06/08/17 2210)    And  insulin detemir (LEVEMIR) injection 55 Units (55 Units Subcutaneous Given 06/09/17 0031)  fluconazole (DIFLUCAN) tablet 200 mg (200 mg Oral Given 06/09/17 0028)  piperacillin-tazobactam (  ZOSYN) IVPB 3.375 g (3.375 g Intravenous New Bag/Given 06/09/17 0030)  vitamin A & D ointment (not administered)  sodium chloride 0.9 % bolus 1,000 mL (0 mLs Intravenous Stopped 06/08/17 1726)    And  sodium chloride 0.9 % bolus 1,000 mL (0 mLs Intravenous Stopped 06/08/17 1846)    And  sodium chloride 0.9 % bolus 1,000 mL (0 mLs Intravenous Stopped 06/08/17 1919)  ceFEPIme (MAXIPIME) 2  g in dextrose 5 % 50 mL IVPB (0 g Intravenous Stopped 06/08/17 1726)  vancomycin (VANCOCIN) IVPB 1000 mg/200 mL premix (0 mg Intravenous Stopped 06/08/17 1846)  acetaminophen (TYLENOL) tablet 1,000 mg (1,000 mg Oral Given 06/08/17 1823)  ipratropium-albuterol (DUONEB) 0.5-2.5 (3) MG/3ML nebulizer solution (3 mLs  Given 06/08/17 2150)  alteplase (CATHFLO ACTIVASE) injection 2 mg (2 mg Intracatheter Given 06/08/17 2239)  calcium gluconate 1 g in sodium chloride 0.9 % 100 mL IVPB (1 g Intravenous New Bag/Given 06/09/17 0030)     Initial Impression / Assessment and Plan / ED Course  I have reviewed the triage vital signs and the nursing notes.  Pertinent labs & imaging results that were available during my care of the patient were reviewed by me and considered in my medical decision making (see chart for details).     Patient with right upper lobe pneumonia and anemia. Code sepsis was called. Weight-based fluids given. Vancomycin and cefepime initiated. Blood cultures pending. UA shows small hematuria, trace leukocytes. Urine culture sent. CBC shows WBC 16.2, hemoglobin 6.8, platelet 405K. CMP shows sodium 133, chloride 96, calcium 7.5, albumin 2.1, AST 42. Lactic acid 1.33. Type and screen initiated. I received consent from the patient to transfuse blood. One unit initiated in the ED. Patient also with chronic sacral decubitus ulcers as well as suspected chronic right great toe osteomyelitis. X-ray of the right foot shows suspected bone destruction at the medial aspect, tuft of the distal phalanx. X-ray of the left toe shows severe osteopenia without acute osseous process. Multiple factors could be contributing to patient's sepsis. I consulted [facility] and spoke with Dr. Lorin Mercy who will admit the patient for further evaluation and treatment. Patient also evaluated by Dr. Sherry Ruffing who guided the patient's management and agrees with plan.  Final Clinical Impressions(s) / ED Diagnoses   Final  diagnoses:  HCAP (healthcare-associated pneumonia)  Sepsis, due to unspecified organism [facility])    New Prescriptions Current Discharge Medication List       Caryl Ada 06/09/17 0216    Tegeler, Gwenyth Allegra, MD 06/09/17 1047

## 2017-06-08 NOTE — ED Notes (Signed)
Second blood culture sent to lab.

## 2017-06-09 ENCOUNTER — Encounter (HOSPITAL_COMMUNITY): Payer: Medicare Other

## 2017-06-09 DIAGNOSIS — I10 Essential (primary) hypertension: Secondary | ICD-10-CM

## 2017-06-09 LAB — CBC WITH DIFFERENTIAL/PLATELET
Basophils Absolute: 0 10*3/uL (ref 0.0–0.1)
Basophils Absolute: 0 10*3/uL (ref 0.0–0.1)
Basophils Relative: 0 %
Basophils Relative: 0 %
EOS ABS: 0.1 10*3/uL (ref 0.0–0.7)
EOS PCT: 0 %
Eosinophils Absolute: 0 10*3/uL (ref 0.0–0.7)
Eosinophils Relative: 1 %
HCT: 25.8 % — ABNORMAL LOW (ref 36.0–46.0)
HEMATOCRIT: 21.8 % — AB (ref 36.0–46.0)
HEMOGLOBIN: 6.9 g/dL — AB (ref 12.0–15.0)
HEMOGLOBIN: 8.1 g/dL — AB (ref 12.0–15.0)
LYMPHS ABS: 1.3 10*3/uL (ref 0.7–4.0)
LYMPHS ABS: 0.7 10*3/uL (ref 0.7–4.0)
LYMPHS PCT: 4 %
Lymphocytes Relative: 12 %
MCH: 23.3 pg — AB (ref 26.0–34.0)
MCH: 23.1 pg — AB (ref 26.0–34.0)
MCHC: 31.7 g/dL (ref 30.0–36.0)
MCHC: 31.4 g/dL (ref 30.0–36.0)
MCV: 73.6 fL — ABNORMAL LOW (ref 78.0–100.0)
MCV: 73.5 fL — AB (ref 78.0–100.0)
MONOS PCT: 10 %
Monocytes Absolute: 1.1 10*3/uL — ABNORMAL HIGH (ref 0.1–1.0)
Monocytes Absolute: 0.4 10*3/uL (ref 0.1–1.0)
Monocytes Relative: 3 %
NEUTROS ABS: 13.9 10*3/uL — AB (ref 1.7–7.7)
NEUTROS PCT: 77 %
Neutro Abs: 8.3 10*3/uL — ABNORMAL HIGH (ref 1.7–7.7)
Neutrophils Relative %: 93 %
Platelets: 356 10*3/uL (ref 150–400)
Platelets: 375 10*3/uL (ref 150–400)
RBC: 2.96 MIL/uL — ABNORMAL LOW (ref 3.87–5.11)
RBC: 3.51 MIL/uL — AB (ref 3.87–5.11)
RDW: 20.1 % — ABNORMAL HIGH (ref 11.5–15.5)
RDW: 19.8 % — ABNORMAL HIGH (ref 11.5–15.5)
WBC: 10.8 10*3/uL — ABNORMAL HIGH (ref 4.0–10.5)
WBC: 15 10*3/uL — AB (ref 4.0–10.5)

## 2017-06-09 LAB — BASIC METABOLIC PANEL
Anion gap: 7 (ref 5–15)
BUN: 11 mg/dL (ref 6–20)
CO2: 27 mmol/L (ref 22–32)
CREATININE: 0.53 mg/dL (ref 0.44–1.00)
Calcium: 7.3 mg/dL — ABNORMAL LOW (ref 8.9–10.3)
Chloride: 103 mmol/L (ref 101–111)
GFR calc non Af Amer: >60 mL/min (ref >60)
GLUCOSE: 90 mg/dL (ref 65–99)
Potassium: 3.8 mmol/L (ref 3.5–5.1)
Sodium: 137 mmol/L (ref 135–145)

## 2017-06-09 LAB — SEDIMENTATION RATE: Sed Rate: 119 mm/hr — ABNORMAL HIGH (ref 0–22)

## 2017-06-09 LAB — EXPECTORATED SPUTUM ASSESSMENT W GRAM STAIN, RFLX TO RESP C: Special Requests: NORMAL

## 2017-06-09 LAB — PREPARE RBC (CROSSMATCH)

## 2017-06-09 LAB — EXPECTORATED SPUTUM ASSESSMENT W REFEX TO RESP CULTURE

## 2017-06-09 LAB — GLUCOSE, CAPILLARY
GLUCOSE-CAPILLARY: 51 mg/dL — AB (ref 65–99)
GLUCOSE-CAPILLARY: 198 mg/dL — AB (ref 65–99)
Glucose-Capillary: 82 mg/dL (ref 65–99)
Glucose-Capillary: 73 mg/dL (ref 65–99)
Glucose-Capillary: 96 mg/dL (ref 65–99)

## 2017-06-09 LAB — APTT: APTT: 47 s — AB (ref 24–36)

## 2017-06-09 LAB — FERRITIN: Ferritin: 141 ng/mL (ref 11–307)

## 2017-06-09 LAB — PROTIME-INR
INR: 1.55
PROTHROMBIN TIME: 18.8 s — AB (ref 11.4–15.2)

## 2017-06-09 LAB — C-REACTIVE PROTEIN: CRP: 22.6 mg/dL — ABNORMAL HIGH (ref <1.0)

## 2017-06-09 LAB — MAGNESIUM: MAGNESIUM: 2.1 mg/dL (ref 1.7–2.4)

## 2017-06-09 LAB — PROCALCITONIN
PROCALCITONIN: 2.11 ng/mL
PROCALCITONIN: 1.8 ng/mL

## 2017-06-09 LAB — PREALBUMIN: Prealbumin: <5 mg/dL — ABNORMAL LOW (ref 18–38)

## 2017-06-09 LAB — STREP PNEUMONIAE URINARY ANTIGEN: Strep Pneumo Urinary Antigen: NEGATIVE

## 2017-06-09 LAB — LACTIC ACID, PLASMA: LACTIC ACID, VENOUS: 1.2 mmol/L (ref 0.5–1.9)

## 2017-06-09 LAB — HIV ANTIBODY (ROUTINE TESTING W REFLEX): HIV SCREEN 4TH GENERATION: NONREACTIVE

## 2017-06-09 MED ORDER — GLUCERNA SHAKE PO LIQD
237.0000 mL | Freq: Two times a day (BID) | ORAL | Status: DC
  Administered 2017-06-09 – 2017-06-13 (×8): 237 mL via ORAL
  Filled 2017-06-09 (×12): qty 237

## 2017-06-09 MED ORDER — SODIUM CHLORIDE 0.9 % IV SOLN
Freq: Once | INTRAVENOUS | Status: AC
  Administered 2017-06-09: 08:00:00 via INTRAVENOUS

## 2017-06-09 MED ORDER — VITAMINS A & D EX OINT
TOPICAL_OINTMENT | CUTANEOUS | Status: AC
  Administered 2017-06-09: 1
  Filled 2017-06-09: qty 5

## 2017-06-09 MED ORDER — PRO-STAT SUGAR FREE PO LIQD
30.0000 mL | Freq: Two times a day (BID) | ORAL | Status: DC
  Administered 2017-06-09 (×2): 30 mL via ORAL
  Administered 2017-06-10: 22:00:00 via ORAL
  Administered 2017-06-12 – 2017-06-15 (×4): 30 mL via ORAL
  Filled 2017-06-09 (×9): qty 30

## 2017-06-09 MED ORDER — COLLAGENASE 250 UNIT/GM EX OINT
TOPICAL_OINTMENT | Freq: Every day | CUTANEOUS | Status: DC
  Administered 2017-06-10 – 2017-06-15 (×6): via TOPICAL
  Filled 2017-06-09: qty 90

## 2017-06-09 MED ORDER — INSULIN DETEMIR 100 UNIT/ML ~~LOC~~ SOLN
10.0000 [IU] | Freq: Every day | SUBCUTANEOUS | Status: DC
  Administered 2017-06-10 – 2017-06-15 (×6): 10 [IU] via SUBCUTANEOUS
  Filled 2017-06-09 (×6): qty 0.1

## 2017-06-09 MED ORDER — CHLORHEXIDINE GLUCONATE 0.12 % MT SOLN
15.0000 mL | Freq: Two times a day (BID) | OROMUCOSAL | Status: DC
  Administered 2017-06-09 – 2017-06-15 (×12): 15 mL via OROMUCOSAL
  Filled 2017-06-09 (×12): qty 15

## 2017-06-09 MED ORDER — SODIUM CHLORIDE 0.9% FLUSH: 10.0000 mL | INTRAVENOUS | Status: DC | PRN

## 2017-06-09 MED ORDER — CHLORHEXIDINE GLUCONATE CLOTH 2 % EX PADS: 6.0000 | MEDICATED_PAD | Freq: Every day | CUTANEOUS | Status: DC

## 2017-06-09 MED ORDER — IPRATROPIUM-ALBUTEROL 0.5-2.5 (3) MG/3ML IN SOLN
3.0000 mL | Freq: Four times a day (QID) | RESPIRATORY_TRACT | Status: DC
  Administered 2017-06-09 – 2017-06-11 (×7): 3 mL via RESPIRATORY_TRACT
  Filled 2017-06-09 (×8): qty 3

## 2017-06-09 MED ORDER — INSULIN DETEMIR 100 UNIT/ML ~~LOC~~ SOLN
10.0000 [IU] | Freq: Every day | SUBCUTANEOUS | Status: DC
  Administered 2017-06-09 – 2017-06-14 (×6): 10 [IU] via SUBCUTANEOUS
  Filled 2017-06-09 (×7): qty 0.1

## 2017-06-09 MED ORDER — SODIUM CHLORIDE 0.9% FLUSH: 10.0000 mL | Freq: Two times a day (BID) | INTRAVENOUS | Status: DC

## 2017-06-09 MED ORDER — ACETAMINOPHEN 325 MG PO TABS
650.0000 mg | ORAL_TABLET | Freq: Four times a day (QID) | ORAL | Status: DC | PRN
  Administered 2017-06-09 – 2017-06-12 (×2): 650 mg via ORAL
  Filled 2017-06-09 (×2): qty 2

## 2017-06-09 NOTE — Progress Notes (Signed)
PROGRESS NOTE    Shannon Schmidt  ZOX:[id number] DOB: 07/25/1941 DOA: 06/08/2017 PCP: Theressa Millard, MD    Brief Narrative:  76 year old female who presented with low hemoglobin. Patient is known to have COPD, type 2 diabetes mellitus, chronic feet osteomyelitis and recent history of pneumonia pneumonia. Has been on multiple antibiotics including cefepime, vancomycin and levofloxacin. She developed persistent, productive cough. She was found to have a low hemoglobin down to 6.3 at the skilled nursing facility, she was transferred to the hospital for further management including packed red blood cell transfusion. On initial physical examination blood pressure 122/60, heart rate 92, respiratory rate 20, oxygen saturation 95%, temperature 98.7. Her lungs had diffuse rhonchi bilaterally with coarse breath sounds, heart S1-S2 present rhythmic, no gallops, rubs or murmurs, the abdomen was soft, nontender, no organomegaly, no lower extremity edema. Multiple pressure ulcers, bilateral feet, and sacral. Sodium 133, potassium 5.1, chloride 96, bicarbonate 29, glucose 83, BUN 12, creatinine 0.71, AST 42, ALT 21, white count 16.2, hemoglobin 6.8, platelets 405, urine analysis negative for infection, fecal occult blood negative. Chest x-ray, large alveolar infiltrate in the right upper lobe, new compared to chest x-ray from April. Foot x-ray with bone instruction of the medial aspect of the distal phalanx of the left great toe concerning for osteomyelitis.   Patient was admitted to the hospital with working diagnosis of acute symptomatic anemia complicated by a right upper lobe pneumonia and left foot great toe osteomyelitis.   Assessment & Plan:   Principal Problem:   Sepsis ([place]) Active Problems:   Diabetes mellitus ([place])   Decubitus ulcers   HTN (hypertension)   Anemia   HCAP (healthcare-associated pneumonia)   Osteomyelitis (HCC)   Oral thrush   Severe malnutrition (HCC)   Hypocalcemia  1.  Symptomatic anemia. Patient had 2 units prbc transfused, will follow on cell count after blood transfusion, no signs of active bleeding, likely anemia is multifactorial. Fecal occult blood test negative.   2. Right upper lobe pneumonia, present on admissio. Chest film personally reviewed noted large infiltrate, alveolar, at the right upper lobe, will continue antibiotic therapy, oxymetry monitoring and supplemental 02 per [place], aspiration precautions and swallow evaluation.   3. Left foot osteomyelitis. Will continue antibiotic therapy, with IV vancomycin and Zosyn, follow on cell count and temperature curve.   4. Multiple decubitus ulcers. Patient very deconditioned and poor mobility, very poor prognosis, will follow on wound care recommendations. Frequent turning.   5. Type 2 diabetes mellitus. Will continue insulin sliding scale for glucose cover and monitoring, capillary glucose 91, 51, 83, 73. Patient tolerating po well. Will reduce dose of basal indulin to prevent hypoglycemia. Continue prednisone 20 mg daily.   6. Hypertension. Will continue furosemide. Blood pressure systolic 811 to 914. Clinically euvolemic, not on IV fluids. On metoprolol succinate   DVT prophylaxis: scd Code Status: DNR Family Communication:  Disposition Plan:    Consultants:     Procedures:   Antimicrobials:    Subjective: Patient with stable dyspnea, no chest pain, no nausea or vomiting. Persistent cough and chronic joint pain.   Objective: Vitals:   06/09/17 0700 06/09/17 0751 06/09/17 0758 06/09/17 0800  BP:  (!) 169/72  (!) 196/72  Pulse: 93 99  100  Resp: 16 (!) 32  (!) 23  Temp:  97.9 F (36.6 C)  97.9 F (36.6 C)  TempSrc:  Oral  Oral  SpO2: 91% 95% 95% 98%  Weight:      Height:  Intake/Output Summary (Last 24 hours) at 06/09/17 0812 Last data filed at 06/09/17 0800  Gross per 24 hour  Intake          4676.25 ml  Output              350 ml  Net          4326.25 ml   Filed  Weights   06/08/17 1606 06/08/17 2216  Weight: 89.4 kg (197 lb) 93.7 kg (206 lb 9.1 oz)    Examination:  General exam: deconditioned  E ENT: mild pallor, no icterus, oral mucosa moist.   Respiratory system: Rales at the right upper lung, with no wheezing, scattered rhonchi.   Cardiovascular system: S1 & S2 heard, RRR. No JVD, murmurs, rubs, gallops or clicks. ++ pittingl edema. Gastrointestinal system: Abdomen is nondistended, soft and nontender. No organomegaly or masses felt. Normal bowel sounds heard. Central nervous system: Alert and oriented. No focal neurological deficits. Extremities: Symmetric 5 x 5 power. Skin: decubitus ulcer stage 4, necrotic area at the base of the first toe bilaterally.      Data Reviewed: I have personally reviewed following labs and imaging studies  CBC:  Recent Labs Lab 06/08/17 1617 06/08/17 1629 06/09/17 0359  WBC 16.2*  --  10.8*  NEUTROABS 13.5*  --  8.3*  HGB 6.8* 7.5* 6.9*  HCT 22.3* 22.0* 21.8*  MCV 70.6*  --  73.6*  PLT 405*  --  409   Basic Metabolic Panel:  Recent Labs Lab 06/08/17 1617 06/08/17 1629 06/09/17 0137 06/09/17 0359  NA 133* 131*  --  137  K 5.1 6.0*  --  3.8  CL 96* 94*  --  103  CO2 29  --   --  27  GLUCOSE 83 88  --  90  BUN 12 14  --  11  CREATININE 0.71 0.60  --  0.53  CALCIUM 7.5*  --   --  7.3*  MG  --   --  2.1  --    GFR: Estimated Creatinine Clearance: 76.6 mL/min (by C-G formula based on SCr of 0.53 mg/dL). Liver Function Tests:  Recent Labs Lab 06/08/17 1617  AST 42*  ALT 21  ALKPHOS 70  BILITOT 0.8  PROT 7.2  ALBUMIN 2.1*   No results for input(s): LIPASE, AMYLASE in the last 168 hours. No results for input(s): AMMONIA in the last 168 hours. Coagulation Profile:  Recent Labs Lab 06/09/17 0137  INR 1.55   Cardiac Enzymes: No results for input(s): CKTOTAL, CKMB, CKMBINDEX, TROPONINI in the last 168 hours. BNP (last 3 results) No results for input(s): PROBNP in the last 8760  hours. HbA1C: No results for input(s): HGBA1C in the last 72 hours. CBG:  Recent Labs Lab 06/08/17 2211 06/09/17 0756  GLUCAP 91 51*   Lipid Profile: No results for input(s): CHOL, HDL, LDLCALC, TRIG, CHOLHDL, LDLDIRECT in the last 72 hours. Thyroid Function Tests: No results for input(s): TSH, T4TOTAL, FREET4, T3FREE, THYROIDAB in the last 72 hours. Anemia Panel: No results for input(s): VITAMINB12, FOLATE, FERRITIN, TIBC, IRON, RETICCTPCT in the last 72 hours. Sepsis Labs:  Recent Labs Lab 06/08/17 1745 06/09/17 0137 06/09/17 0359  PROCALCITON  --  2.11 1.80  LATICACIDVEN 1.33 1.2  --     Recent Results (from the past 240 hour(s))  MRSA PCR Screening     Status: None   Collection Time: 06/08/17  9:36 PM  Result Value Ref Range Status   MRSA by PCR  NEGATIVE NEGATIVE Final    Comment:        The GeneXpert MRSA Assay (FDA approved for NASAL specimens only), is one component of a comprehensive MRSA colonization surveillance program. It is not intended to diagnose MRSA infection nor to guide or monitor treatment for MRSA infections.   Culture, sputum-assessment     Status: None   Collection Time: 06/09/17  3:59 AM  Result Value Ref Range Status   Specimen Description SPU  Final   Special Requests Normal  Final   Sputum evaluation THIS SPECIMEN IS ACCEPTABLE FOR SPUTUM CULTURE  Final   Report Status 06/09/2017 FINAL  Final         Radiology Studies: Dg Chest 2 View  Result Date: 06/08/2017 CLINICAL DATA:  Leukocytosis, recent treatment for pneumonia. History of diabetes, paraplegia. EXAM: CHEST  2 VIEW COMPARISON:  Chest radiograph March 09, 2014 FINDINGS: Dense consolidation RIGHT upper lobe. No pleural effusion. Cardiac silhouette is normal in size, calcified aortic knob. Apical pleural capping. No pneumothorax. RIGHT PICC distal tip projects in proximal superior vena cava. Moderate degenerative change of the thoracic spine. IMPRESSION: RIGHT upper lobe  pneumonia. Followup PA and lateral chest X-ray is recommended in 3-4 weeks following trial of antibiotic therapy to ensure resolution and exclude underlying malignancy. Electronically Signed   By: Elon Alas M.D.   On: 06/08/2017 17:30   Dg Foot Complete Left  Result Date: 06/08/2017 CLINICAL DATA:  Black great toe.  History of diabetes, paraplegia. EXAM: LEFT FOOT - COMPLETE 3+ VIEW COMPARISON:  None. FINDINGS: There is no evidence of fracture or dislocation. Thickened fifth metatarsus diaphysis suggests old fracture. Severe osteopenia. There is no evidence of arthropathy or other focal bone abnormality. Soft tissue swelling. Mild vascular calcifications. IMPRESSION: Severe osteopenia without acute osseous process. Soft tissue swelling. Electronically Signed   By: Elon Alas M.D.   On: 06/08/2017 17:34   Dg Foot Complete Right  Result Date: 06/08/2017 CLINICAL DATA:  Black great toe EXAM: RIGHT FOOT COMPLETE - 3+ VIEW COMPARISON:  None FINDINGS: Marked osseous demineralization. Hallux valgus. Prior resection of the PIP joint second toe. Cortical integrity at the medial aspect tuft of distal phalanx appears lost concerning for osteomyelitis. No acute fracture, dislocation or additional bone destruction. Diffuse soft tissue swelling especially at the dorsum of the foot and anterior ankle. Scattered small vessel vascular calcifications. IMPRESSION: Suspected bone destruction at the medial aspect, tuft of distal phalanx LEFT great toe concerning for osteomyelitis. Electronically Signed   By: Lavonia Dana M.D.   On: 06/08/2017 17:32        Scheduled Meds: . chlorhexidine  15 mL Mouth/Throat BID  . Chlorhexidine Gluconate Cloth  6 each Topical Daily  . divalproex  250 mg Oral QHS  . enoxaparin (LOVENOX) injection  40 mg Subcutaneous Q24H  . ferrous sulfate  325 mg Oral BID WC  . fluconazole  200 mg Oral Daily  . furosemide  20 mg Oral Daily  . gabapentin  300 mg Oral TID  . insulin  aspart  0-15 Units Subcutaneous TID WC  . insulin detemir  26 Units Subcutaneous Daily   And  . insulin detemir  55 Units Subcutaneous QHS  . ipratropium-albuterol  3 mL Nebulization QID  . latanoprost  1 drop Both Eyes QHS  . loratadine  10 mg Oral q morning - 10a  . metoprolol succinate  25 mg Oral Daily  . oxycodone  30 mg Oral Q6H  . pantoprazole  40 mg  Oral Daily  . predniSONE  20 mg Oral Q breakfast  . risperiDONE  1.5 mg Oral QHS  . simvastatin  10 mg Oral QHS  . sodium hypochlorite  1 application Irrigation QPM  . tiZANidine  2 mg Oral BID   Continuous Infusions: . sodium chloride 75 mL/hr at 06/09/17 0800  . sodium chloride    . metronidazole Stopped (06/09/17 1791)  . piperacillin-tazobactam (ZOSYN)  IV 3.375 g (06/09/17 0803)  . vancomycin Stopped (06/09/17 5056)     LOS: 1 day        Million Maharaj Gerome Apley, MD [facility] Pager [phone]  If 7PM-7AM, please contact night-coverage [email or web address] Password [email or web address] 06/09/2017, 8:12 AM

## 2017-06-09 NOTE — Progress Notes (Addendum)
Initial Nutrition Assessment  DOCUMENTATION CODES:   Obesity unspecified  INTERVENTION:    Glucerna Shake po BID, each supplement provides 220 kcal and 10 grams of protein   Prostat liquid protein po 30 ml BID with meals, each supplement provides 100 kcal, 15 grams protein  NUTRITION DIAGNOSIS:   Increased nutrient needs related to wound healing as evidenced by estimated needs  GOAL:   Patient will meet greater than or equal to 90% of their needs  MONITOR:   PO intake, Supplement acceptance, Labs, Weight trends, Skin, I & O's  REASON FOR ASSESSMENT:   Low [place], Consult Wound healing  ASSESSMENT:   76 y.o. Female with medical history significant of COPD, DM, and osteomyelitis presenting from SNF with persistent pneumonia x 3-4 weeks.  Pt unresponsive to RD interview questions; has emesis bag. No % PO intake recorded per flowsheets. Labs and medications reviewed. CWOCN consult pending. CBG's [id number]. Albumin  Date Value Ref Range Status  06/08/2017 2.1 (L) 3.5 - 5.0 g/dL Final   Albumin has a half-life of 21 days and is strongly affected by stress response and inflammatory process, therefore, do not expect to see an improvement in this lab value during acute hospitalization.  Unable to complete Nutrition-Focused physical exam at this time.   Diet Order:  Diet Carb Modified Fluid consistency: Thin; Room service appropriate? Yes  Skin:   several chronic stage 3-4 sacral decubitus ulcers, foot ulcers on bilateral great toes  Last BM:  7/12  Height:   Ht Readings from Last 1 Encounters:  06/08/17 5\' 10"  (1.778 m)   Weight:   Wt Readings from Last 1 Encounters:  06/08/17 206 lb 9.1 oz (93.7 kg)   Ideal Body Weight:  68.1 kg  BMI:  Body mass index is 29.64 kg/m.  Estimated Nutritional Needs:   Kcal:  1800-2000  Protein:  100-115 gm  Fluid:  1.8-2.0 L  EDUCATION NEEDS:   No education needs identified at this time  Arthur Holms, RD,  LDN Pager #: [phone] After-Hours Pager #: [phone]

## 2017-06-09 NOTE — Clinical Social Work Note (Signed)
Clinical Social Work Assessment  Patient Details  Name: Shannon Schmidt MRN: [id number] Date of Birth: 1940-12-15  Date of referral:  06/09/17               Reason for consult:  Facility Placement                Permission sought to share information with:  Facility Sport and exercise psychologist, Family Supports Permission granted to share information::  Yes, Verbal Permission Granted  Name::        Agency::     Relationship::     Contact Information:     Housing/Transportation Living arrangements for the past 2 months:    Source of Information:  Patient Patient Interpreter Needed:  None Criminal Activity/Legal Involvement Pertinent to Current Situation/Hospitalization:    Significant Relationships:  Adult Children Lives with:  Facility Resident Do you feel safe going back to the place where you live?  Yes Need for family participation in patient care:  No (Coment)  Care giving concerns:  None listed by pt/famil   Social Worker assessment / plan:  CSW met with pt and confirmed pt's plan to be discharged back to [facility] to live at discharge.  CSW provided active listening and validated pt's concerns.   CSW 1dept was given permission to complete FL-2 and send referrals out to SNF facilities via the hub per pt's request.  Pt has been living at [facility], prior to being admitted to [facility]  Employment status:  Retired Forensic scientist:  Managed Care PT Recommendations:  Not assessed at this time Information / Referral to community resources:     Patient/Family's Response to care:  Patient alert and oriented.  Patient agreeable to plan.  Pt adult children are supportive and strongly involved in pt.'s care, per pt, but all live in other states.  Pt pleasant and appreciated CSW intervention.    Patient/Family's Understanding of and Emotional Response to Diagnosis, Current Treatment, and Prognosis:  Still assessing  Emotional Assessment Appearance:  Appears stated  age Attitude/Demeanor/Rapport:    Affect (typically observed):  Calm, Accepting, Adaptable Orientation:  Oriented to Self, Oriented to Place, Oriented to  Time, Oriented to Situation Alcohol / Substance use:    Psych involvement (Current and /or in the community):     Discharge Needs  Concerns to be addressed:  No discharge needs identified Readmission within the last 30 days:  No Current discharge risk:  None Barriers to Discharge:  No Barriers Identified   Claudine Mouton, LCSWA 06/09/2017, 10:44 PM

## 2017-06-09 NOTE — Progress Notes (Signed)
Hypoglycemic Event  CBG: 51  Treatment: 15 GM carbohydrate snack  Symptoms: None  Follow-up CBG: Time:0829 CBG Result:82  Possible Reasons for Event: Unknown  Comments/MD notified:N/A    Shannon Schmidt

## 2017-06-09 NOTE — Evaluation (Signed)
Clinical/Bedside Swallow Evaluation Patient Details  Name: Shannon Schmidt MRN: [id number] Date of Birth: Jan 31, 1941  Today's Date: 06/09/2017 Time: SLP Start Time (ACUTE ONLY): 84 SLP Stop Time (ACUTE ONLY): 1642 SLP Time Calculation (min) (ACUTE ONLY): 27 min  Past Medical History:  Past Medical History:  Diagnosis Date  . Anemia   . Bipolar 1 disorder ([place])   . Colostomy in place [facility])   . COPD (chronic obstructive pulmonary disease) ([place])   . Depression   . Diabetes mellitus   . GERD (gastroesophageal reflux disease)   . H/O hiatal hernia   . Muscle weakness   . Neuropathy   . Osteomyelitis ([place])   . Paraplegia ([place])   . PONV (postoperative nausea and vomiting)   . RA (rheumatoid arthritis) ([place])   . Schizophrenia [facility])    Past Surgical History:  Past Surgical History:  Procedure Laterality Date  . ABDOMINAL HYSTERECTOMY    . BREAST SURGERY     L breast abscess  . CATARACT EXTRACTION W/PHACO  05/09/2012   Procedure: CATARACT EXTRACTION PHACO AND INTRAOCULAR LENS PLACEMENT (IOC);  Surgeon: Marylynn Pearson, MD;  Location: [facility];  Service: Ophthalmology;  Laterality: Right;  . COLON SURGERY    . EYE SURGERY     L cataract  . GANGLION CYST EXCISION     R wrist x2  . HERNIA REPAIR     abdominal  . JOINT REPLACEMENT     knee  . KNEE SURGERY    . WOUND DEBRIDEMENT     HPI:  76 yo female resident of SNF with abnormal labs and concerns for ongoing pna x 3-4 weeks admitted 06/08/17.   PMH + for paraplegia, DM, COPD, osteomyelitis, bipolar d/o, GERD, hiatal hernia.   CXR showed right upper lobe airspace disease and pt is s/p blood transfusion.  Pt denies dysphagia/aspiration but does admit to one occasion approximately 4 weeks ago where she felt food lodge in her esophagus requiring the nurse to "push on her stomach" to clear it.     Assessment / Plan / Recommendation Clinical Impression  Can not rule out aspiration given pt chronic coughing due to her pneumonia however cough  is more prevalant during po intake.  Pt with no focal CN deficits and does not have cva history.  She does however demonstrate intermittent lingual thrusting with swallowing that may impact oral transiting and lead to premature spillage of boluses into pharynx/larynx.    She also has h/o esophageal reflux, hiatal hernia that may contribute to aspiration risk.  SLP educated pt to recommendations to consider MBS due to pna x 3 weeks of RUL and clinical symptoms however she politely declined.    Educated her to aspiration precautions and advised to consume softer foods due to edentulous status and current medical issue.  SlP questions source of lingual thrusting, ? side effect of medication? Also advised she order extra gravy/sauces with meals to faciliate oral clearance.  SLP also reviewed pH status of water alone making it benign to be aspirated in small amounts.    If pt will participate, she would benefit from [facility] to rule out overt aspiration.  Pt reports she does not want diet changed including consuming thickened liquids - however effective compensation strategies may be discovered under flouroscopy.  Will sign off as education completed  - MD please order MBS if pt will participate and you desire.  Thanks!    SLP Visit Diagnosis: Dysphagia, unspecified (R13.10)    Aspiration Risk  Moderate  aspiration risk    Diet Recommendation Dysphagia 3 (Mech soft);Thin liquid   Liquid Administration via: Cup Medication Administration:  (defer to pt- with pudding if helpful to decrease cough) Supervision: Patient able to self feed Compensations: Slow rate;Small sips/bites    Other  Recommendations Oral Care Recommendations: Oral care BID   Follow up Recommendations        Frequency and Duration            Prognosis        Swallow Study   General Date of Onset: 06/09/17 HPI: 76 yo female resident of SNF with abnormal labs and concerns for ongoing pna x 3-4 weeks admitted 06/08/17.   PMH + for  paraplegia, DM, COPD, osteomyelitis, bipolar d/o, GERD, hiatal hernia.   CXR showed right upper lobe airspace disease and pt is s/p blood transfusion.  Pt denies dysphagia/aspiration but does admit to one occasion approximately 4 weeks ago where she felt food lodge in her esophagus requiring the nurse to "push on her stomach" to clear it.   Type of Study: Bedside Swallow Evaluation Diet Prior to this Study: Regular;Thin liquids Temperature Spikes Noted: No Respiratory Status: Nasal cannula (on oxygen x1 month) History of Recent Intubation: No Behavior/Cognition: Alert;Cooperative;Pleasant mood Oral Cavity Assessment: Other (comment) (pt recently treated for thrush) Oral Care Completed by SLP: No Oral Cavity - Dentition: Edentulous Vision: Functional for self-feeding Self-Feeding Abilities: Able to feed self Patient Positioning: Upright in bed Baseline Vocal Quality: Hoarse Volitional Cough: Strong Volitional Swallow: Able to elicit    Oral/Motor/Sensory Function Overall Oral Motor/Sensory Function: Within functional limits   Ice Chips Ice chips: Not tested   Thin Liquid Thin Liquid: Impaired Presentation: Cup;Self Fed;Straw Oral Phase Impairments: Reduced lingual movement/coordination;Reduced labial seal Pharyngeal  Phase Impairments: Cough - Immediate;Cough - Delayed Other Comments: minimal lingual thrusting with swallowing intermittent    Nectar Thick Nectar Thick Liquid: Impaired Presentation: Cup;Self Fed Pharyngeal Phase Impairments: Cough - Delayed Other Comments: minimal lingual thrusting with swallowing intermittent   Honey Thick Honey Thick Liquid: Not tested   Puree Puree: Impaired Presentation: Self Fed Oral Phase Impairments: Reduced labial seal;Reduced lingual movement/coordination Pharyngeal Phase Impairments: Cough - Delayed Other Comments: minimal lingual thrusting with swallowing intermittent   Solid   GO   Solid: Impaired Presentation: Self Fed Oral Phase  Impairments: Reduced lingual movement/coordination;Impaired mastication Oral Phase Functional Implications: Impaired mastication;Other (comment) (pt expectorated a few boluses she could not transit/masticate) Pharyngeal Phase Impairments: Cough - Delayed        Claudie Fisherman, [facility] SLP [phone]

## 2017-06-09 NOTE — Progress Notes (Addendum)
CRITICAL VALUE ALERT  Critical Value:  Hgb 6.9  Date & Time Notied:  06-09-17 432  Provider Notified: Baltazar Najjar  Orders Received/Actions taken: 1 u PRBC ordered

## 2017-06-09 NOTE — Progress Notes (Signed)
Inpatient Diabetes Program Recommendations  AACE/ADA: New Consensus Statement on Inpatient Glycemic Control (2015)  Target Ranges:  Prepandial:   less than 140 mg/dL      Peak postprandial:   less than 180 mg/dL (1-2 hours)      Critically ill patients:  140 - 180 mg/dL   Results for Shannon Schmidt, Shannon Schmidt (MRN [id number]) as of 06/09/2017 08:41  Ref. Range 06/08/2017 22:11 06/09/2017 07:56  Glucose-Capillary Latest Ref Range: 65 - 99 mg/dL 91 51 (L)    Admit with: Low Hemoglobin/ Sepsis  History: DM  SNF DM Meds: Levemir 26 units AM/ 55 units PM    Humalog 0-10 units BID    Metformin 500 mg daily  Current Insulin Orders: Levemir 26 units AM/ 55 units PM      Novolog Moderate Correction Scale/ SSI (0-15 units) TID AC      MD- Note patient with Hypoglycemia this AM after receiving 55 units Levemir last night at midnight (CBG down to 51 mg/dl).  Please consider reducing Levemir to 50% of home dose for now:  Levemir 13 units AM/ 28 units PM      --Will follow patient during hospitalization--  Wyn Quaker RN, MSN, CDE Diabetes Coordinator Inpatient Glycemic Control Team Team Pager: [phone] (8a-5p)

## 2017-06-09 NOTE — Progress Notes (Signed)
Pt's temp went from 98.3, at the beginning of blood transfusion, to 99.4, at the completion of the transfusion.  MD made aware.  Order for Tylenol given.

## 2017-06-09 NOTE — Consult Note (Signed)
[place] Nurse wound consult note Reason for Consult: LE and sacrum Patient from SNF, reports pressure injuries present since 2006, diverting ostomy due to wounds and proximity of wounds to the anus.  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  Stage 4 Pressure injury left ishcium that tracks 5cm towards the perineal area. Evidence of healing above and lateral to current open area. 9cm x 2cm with track as mentioned already. 100% clean, pink, non granular Right and left feet with darkening of the toes.  Has MRI that is positive for osteomyelitis- will need ortho to address this as it is considered outside of the scope of the [place] nurse  Pressure Injury POA: Yes Measurement: see above Wound bed: see above Drainage (amount, consistency, odor) moderate to heavy drainage. However it is unclear if the current dressings at the time of my assessment are wet from urine.  Patient does have PureWick in place for management of incontinence now. Periwound: intact with evidence of healing left ischium. Epibole of the wound edges left ischial wound Dressing procedure/placement/frequency: Saline moist gauze to the left ischial wound, with proximity of wound to the perineal area moist gauze should maintain this recalcitrant wound at this time. Add low air loss for moisture management and pressure redistribution Ortho or ID For management/treatment of osteomyelitis  Enzymatic debridement ointment for the necrotic tissue present <20% in the sacral wound, cover with saline moist gauze. Change daily.  Discussed POC with patient and bedside nurse.  Re consult if needed, will not follow at this time. Thanks  Lanetta Figuero [employer], RN,CWOCN, CNS, [place] [phone])

## 2017-06-09 NOTE — Care Management Note (Signed)
Case Management Note  Patient Details  Name: Shannon Schmidt MRN: [id number] Date of Birth: 06-03-1941  Subjective/Objective:                  Anemia and hgb of 6.3, possible sepsis  Action/Plan: Date:  June 09, 2017 Chart reviewed for concurrent status and case management needs. Will continue to follow patient progress. Discharge Planning: following for needs/ from [facility] Expected discharge date: [id number] Velva Harman, BSN, [place], [place]  Expected Discharge Date:                  Expected Discharge Plan:  [place]  In-House Referral:  Clinical Social Work  Discharge planning Services  CM Consult  Post Acute Care Choice:    Choice offered to:     DME Arranged:    DME Agency:     HH Arranged:    [place] Agency:     Status of Service:  In process, will continue to follow  If discussed at Long Length of Stay Meetings, dates discussed:    Additional Comments:  Leeroy Cha, RN 06/09/2017, 8:49 AM

## 2017-06-10 ENCOUNTER — Inpatient Hospital Stay (HOSPITAL_COMMUNITY): Payer: Medicare Other

## 2017-06-10 DIAGNOSIS — E11621 Type 2 diabetes mellitus with foot ulcer: Secondary | ICD-10-CM

## 2017-06-10 LAB — BPAM RBC
Blood Product Expiration Date: 201807302359
Blood Product Expiration Date: 201808022359
ISSUE DATE / TIME: 201807121935
ISSUE DATE / TIME: 201807130803
Unit Type and Rh: 5100
Unit Type and Rh: 5100

## 2017-06-10 LAB — CBC WITH DIFFERENTIAL/PLATELET
BASOS ABS: 0 10*3/uL (ref 0.0–0.1)
BASOS PCT: 0 %
Eosinophils Absolute: 0.1 10*3/uL (ref 0.0–0.7)
Eosinophils Relative: 0 %
HEMATOCRIT: 25.8 % — AB (ref 36.0–46.0)
Hemoglobin: 8.3 g/dL — ABNORMAL LOW (ref 12.0–15.0)
LYMPHS PCT: 13 %
Lymphs Abs: 1.5 10*3/uL (ref 0.7–4.0)
MCH: 23.9 pg — ABNORMAL LOW (ref 26.0–34.0)
MCHC: 32.2 g/dL (ref 30.0–36.0)
MCV: 74.4 fL — AB (ref 78.0–100.0)
Monocytes Absolute: 0.6 10*3/uL (ref 0.1–1.0)
Monocytes Relative: 5 %
NEUTROS ABS: 9.7 10*3/uL — AB (ref 1.7–7.7)
NEUTROS PCT: 82 %
Platelets: 406 10*3/uL — ABNORMAL HIGH (ref 150–400)
RBC: 3.47 MIL/uL — AB (ref 3.87–5.11)
RDW: 20.5 % — ABNORMAL HIGH (ref 11.5–15.5)
WBC: 11.8 10*3/uL — AB (ref 4.0–10.5)

## 2017-06-10 LAB — BASIC METABOLIC PANEL
ANION GAP: 6 (ref 5–15)
BUN: 12 mg/dL (ref 6–20)
CO2: 30 mmol/L (ref 22–32)
Calcium: 7.6 mg/dL — ABNORMAL LOW (ref 8.9–10.3)
Chloride: 99 mmol/L — ABNORMAL LOW (ref 101–111)
Creatinine, Ser: 0.63 mg/dL (ref 0.44–1.00)
Glucose, Bld: 180 mg/dL — ABNORMAL HIGH (ref 65–99)
POTASSIUM: 3.9 mmol/L (ref 3.5–5.1)
Sodium: 135 mmol/L (ref 135–145)

## 2017-06-10 LAB — TYPE AND SCREEN
ABO/RH(D): O POS
Antibody Screen: NEGATIVE
Unit division: 0
Unit division: 0

## 2017-06-10 LAB — GLUCOSE, CAPILLARY
GLUCOSE-CAPILLARY: 270 mg/dL — AB (ref 65–99)
Glucose-Capillary: 151 mg/dL — ABNORMAL HIGH (ref 65–99)
Glucose-Capillary: 214 mg/dL — ABNORMAL HIGH (ref 65–99)
Glucose-Capillary: 193 mg/dL — ABNORMAL HIGH (ref 65–99)

## 2017-06-10 LAB — PROCALCITONIN: PROCALCITONIN: 6.97 ng/mL

## 2017-06-10 LAB — HEMOGLOBIN A1C
Hgb A1c MFr Bld: 6.8 % — ABNORMAL HIGH (ref 4.8–5.6)
Mean Plasma Glucose: 148 mg/dL

## 2017-06-10 LAB — URINE CULTURE
Culture: NO GROWTH
Special Requests: NORMAL

## 2017-06-10 LAB — VANCOMYCIN, TROUGH: VANCOMYCIN TR: 15 ug/mL (ref 15–20)

## 2017-06-10 MED ORDER — LIP MEDEX EX OINT
TOPICAL_OINTMENT | CUTANEOUS | Status: AC
  Administered 2017-06-10: 1
  Filled 2017-06-10: qty 7

## 2017-06-10 MED ORDER — OXYCODONE HCL 5 MG PO TABS
20.0000 mg | ORAL_TABLET | Freq: Once | ORAL | Status: AC
  Administered 2017-06-10: 20 mg via ORAL
  Filled 2017-06-10 (×2): qty 4

## 2017-06-10 NOTE — Progress Notes (Signed)
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   RIGHT    LEFT    PRESSURE WAVEFORM  PRESSURE WAVEFORM  BRACHIAL PICC Triphasic BRACHIAL 166 Triphasic  DP 117 Monophasic DP 125 Monophasic  PT 102 Monophasic PT 92 Monophasic    RIGHT LEFT  ABI 0.7 0.75     Legrand Como, RVT 06/10/2017, 9:17 AM

## 2017-06-10 NOTE — Progress Notes (Signed)
CSW briefly spoke with patient regarding discharge plans. Patient has been a resident at [facility] since 2011 and plans to return. Patient did not have any questions or concerns at this time. Patient was pleasant with CSW while in the room. CSW will complete FL2.  Discharge plan:  Return to [facility] once medically stable.   Kingsley Spittle, [facility] Emergency Room Clinical Social Worker [phone]

## 2017-06-10 NOTE — Progress Notes (Signed)
Pharmacy Antibiotic Note  Shannon Schmidt is a 76 y.o. female admitted on 06/08/2017 with pneumonia, possible aspiration and also concerns for diabetic foot infection. Per NH MAR she was recently on IV Cefepime & Vancomycin which changed to oral Levaquin on 06/02/17.  She continues on Levaquin & Prednisone 20mg  for persistent PNA at NH however she has worsened & presents with fever and increased O2 requirements.  PMH is also pertinent for chronic osteomyelitis of foot, multiple pressure ulcers- bilateral feet and sacral.  Pharmacy has been consulted for Vancomycin & Zosyn dosing.  Day #3 antibiotics. Renal function stable.   Plan:  7/14 VT at 1700 is 15 mcg/ml Continue Vancomycin 1gm IV q12h.   Continue Zosyn 3.375g IV q8h (4 hour infusion time).   Monitor clinical course, renal function, cultures as available   Height: 5\' 10"  (177.8 cm) Weight: 206 lb 9.1 oz (93.7 kg) IBW/kg (Calculated) : 68.5  Temp (24hrs), Avg:98.1 F (36.7 C), Min:97.7 F (36.5 C), Max:98.5 F (36.9 C)   Recent Labs Lab 06/08/17 1617 06/08/17 1629 06/08/17 1745 06/09/17 0137 06/09/17 0359 06/09/17 1524 06/10/17 0500 06/10/17 1700  WBC 16.2*  --   --   --  10.8* 15.0* 11.8*  --   CREATININE 0.71 0.60  --   --  0.53  --  0.63  --   LATICACIDVEN  --   --  1.33 1.2  --   --   --   --   VANCOTROUGH  --   --   --   --   --   --   --  15    Estimated Creatinine Clearance: 76.6 mL/min (by C-G formula based on SCr of 0.63 mg/dL).    Allergies  Allergen Reactions  . Ace Inhibitors Other (See Comments)    Unknown   . Chlorpromazine Other (See Comments)    Unknown  . Haloperidol Decanoate Other (See Comments)    Unknown    Antimicrobials this admission: 7/12 Vanc >>  7/12 Cefepime >> x1 7/12 Zosyn >> 7/12 fluconazole >>  7/12 Flagyl >> 7/13  Dose adjustments this admission: 7/14 at 1700: VT 15 mcg/ml on 1000 mg IV q12h    Microbiology results: 7/12 BCx: ngtd 7/12 UCx:  NGF 7/13 sputum cx:   7/12 MRSA PCR neg 7/12 step pneumo ur ag: neg   Thank you for allowing pharmacy to be a part of this patient's care.  Royetta Asal, PharmD, BCPS Pager [phone] 06/10/2017 6:37 PM

## 2017-06-10 NOTE — Progress Notes (Signed)
Pharmacy Antibiotic Note  Shannon Schmidt is a 76 y.o. female admitted on 06/08/2017 with pneumonia, possible aspiration and also concerns for diabetic foot infection. Per NH MAR she was recently on IV Cefepime & Vancomycin which changed to oral Levaquin on 06/02/17.  She continues on Levaquin & Prednisone 20mg  for persistent PNA at NH however she has worsened & presents with fever and increased O2 requirements.  PMH is also pertinent for chronic osteomyelitis of foot, multiple pressure ulcers- bilateral feet and sacral.  Pharmacy has been consulted for Vancomycin & Zosyn dosing.  Day #3 antibiotics. Renal function stable.   Plan: Continue Vancomycin 1gm IV q12h. Check trough this evening (goal trough 15-61mcg/ml). Continue Zosyn 3.375g IV q8h (4 hour infusion time).   Height: 5\' 10"  (177.8 cm) Weight: 206 lb 9.1 oz (93.7 kg) IBW/kg (Calculated) : 68.5  Temp (24hrs), Avg:98.3 F (36.8 C), Min:97.7 F (36.5 C), Max:99.4 F (37.4 C)   Recent Labs Lab 06/08/17 1617 06/08/17 1629 06/08/17 1745 06/09/17 0137 06/09/17 0359 06/09/17 1524 06/10/17 0500  WBC 16.2*  --   --   --  10.8* 15.0* 11.8*  CREATININE 0.71 0.60  --   --  0.53  --  0.63  LATICACIDVEN  --   --  1.33 1.2  --   --   --     Estimated Creatinine Clearance: 76.6 mL/min (by C-G formula based on SCr of 0.63 mg/dL).    Allergies  Allergen Reactions  . Ace Inhibitors Other (See Comments)    Unknown   . Chlorpromazine Other (See Comments)    Unknown  . Haloperidol Decanoate Other (See Comments)    Unknown    Antimicrobials this admission: 7/12 Vanc >>  7/12 Cefepime >> x1 7/12 Zosyn >> 7/12 fluconazole >>  7/12 Flagyl >> 7/13  Dose adjustments this admission:   Microbiology results: 7/12 BCx: ngtd 7/12 UCx:   7/13 sputum cx:  7/12 MRSA PCR neg 7/12 step pneumo ur ag: neg   Thank you for allowing pharmacy to be a part of this patient's care.  Hershal Coria 06/10/2017 9:54 AM

## 2017-06-10 NOTE — Progress Notes (Signed)
PROGRESS NOTE    Shannon Schmidt  YIR:[id number] DOB: 1941-08-31 DOA: 06/08/2017 PCP: Theressa Millard, MD     Brief Narrative:  76 year old female who presented with low hemoglobin. Patient is known to have COPD, type 2 diabetes mellitus, chronic feet osteomyelitis and recent history of pneumonia pneumonia. Has been on multiple antibiotics including cefepime, vancomycin and levofloxacin. She developed persistent, productive cough. She was found to have a low hemoglobin down to 6.3 at the skilled nursing facility, she was transferred to the hospital for further management including packed red blood cell transfusion. On initial physical examination blood pressure 122/60, heart rate 92, respiratory rate 20, oxygen saturation 95%, temperature 98.7. Her lungs had diffuse rhonchi bilaterally with coarse breath sounds, heart S1-S2 present rhythmic, no gallops, rubs or murmurs, the abdomen was soft, nontender, no organomegaly, no lower extremity edema. Multiple pressure ulcers, bilateral feet, and sacral. Sodium 133, potassium 5.1, chloride 96, bicarbonate 29, glucose 83, BUN 12, creatinine 0.71, AST 42, ALT 21, white count 16.2, hemoglobin 6.8, platelets 405, urine analysis negative for infection, fecal occult blood negative. Chest x-ray, large alveolar infiltrate in the right upper lobe, new compared to chest x-ray from April. Foot x-ray with bone instruction of the medial aspect of the distal phalanx of the left great toe concerning for osteomyelitis.   Patient was admitted to the hospital with working diagnosis of acute symptomatic anemia complicated by a right upper lobe pneumonia and left foot great toe osteomyelitis.    Assessment & Plan:   Principal Problem:   Sepsis ([place]) Active Problems:   Diabetes mellitus ([place])   Decubitus ulcers   HTN (hypertension)   Anemia   HCAP (healthcare-associated pneumonia)   Osteomyelitis ([facility])   Oral thrush   Severe malnutrition ([facility])    Hypocalcemia    1. Symptomatic multifactorial anemia.  Patient with no signs of blood loss, hb and hct stable after blood transfusion, 2 units prbc. Will follow cell count in am. Continue ferrous sulfate.    2. Right upper lobe pneumonia, present on admissio. Continue antibiotic therapy, oxymetry monitoring and supplemental 02 per [place], aspiration precautions. Dysphagia 3 per speech therapy. Patient has a picc line and apparently on antibiotics at the nursing facility. I have called for further records. Continue IV zosyn for now. Despite large right upper lobe infiltrate, patient with no significant dyspnea and oxygenating well at 96% on 2 LPM per nasal cannula.   3. Left foot osteomyelitis.For now continue IV vancomycin and Zosyn, follow on requested records.  4. Multiple decubitus ulcers.  Poor mobility, continue local wound care.  5. Type 2 diabetes mellitus. Continue insulin sliding scale for glucose cover and monitoring, capillary glucose 198, 151, 214. Patient tolerating po well. AM fasting serum glucose 180, continue basal regimen with 10 units bid of levimir. Patient on prednisone on admission.   6. Hypertension.  Furosemide and metoprolol succinate. Systolic blood pressure 500.   7. Depression. Continue risperidone and dapakote.   DVT prophylaxis: scd Code Status: DNR Family Communication:  Disposition Plan:    Consultants:     Procedures:  Procedures:    Antimicrobials:   Vancomycin  Zosyn     Subjective: Patient with no significant dyspnea, no chest pain, no abdominal pain, no nausea or vomiting.   Objective: Vitals:   06/09/17 2101 06/10/17 0542 06/10/17 0740 06/10/17 0919  BP:  137/89  (!) 155/69  Pulse:  81  96  Resp:  16    Temp:  97.7 F (36.5 C)  TempSrc:  Oral    SpO2: 93% 98% 98%   Weight:      Height:        Intake/Output Summary (Last 24 hours) at 06/10/17 0929 Last data filed at 06/10/17 0841  Gross per 24 hour  Intake               480 ml  Output                0 ml  Net              480 ml   Filed Weights   06/08/17 1606 06/08/17 2216  Weight: 89.4 kg (197 lb) 93.7 kg (206 lb 9.1 oz)    Examination:  General exam: not in pain or dyspnea  E ENT: mild pallor, no icterus, oral mucosa moist.  Respiratory system: rales at the upper lobe, no wheezing, or rhonchi. Respiratory effort normal. Cardiovascular system: S1 & S2 heard, RRR. No JVD, murmurs, rubs, gallops or clicks. +++ pitting edema at the feet. Gastrointestinal system: Abdomen is nondistended, soft and nontender. No organomegaly or masses felt. Normal bowel sounds heard. Central nervous system: Alert and oriented. No focal neurological deficits. Extremities: Symmetric 5 x 5 power. Skin: Positive dark discoloration at the 1st toes bilaterally.      Data Reviewed: I have personally reviewed following labs and imaging studies  CBC:  Recent Labs Lab 06/08/17 1617 06/08/17 1629 06/09/17 0359 06/09/17 1524 06/10/17 0500  WBC 16.2*  --  10.8* 15.0* 11.8*  NEUTROABS 13.5*  --  8.3* 13.9* 9.7*  HGB 6.8* 7.5* 6.9* 8.1* 8.3*  HCT 22.3* 22.0* 21.8* 25.8* 25.8*  MCV 70.6*  --  73.6* 73.5* 74.4*  PLT 405*  --  356 375 161*   Basic Metabolic Panel:  Recent Labs Lab 06/08/17 1617 06/08/17 1629 06/09/17 0137 06/09/17 0359 06/10/17 0500  NA 133* 131*  --  137 135  K 5.1 6.0*  --  3.8 3.9  CL 96* 94*  --  103 99*  CO2 29  --   --  27 30  GLUCOSE 83 88  --  90 180*  BUN 12 14  --  11 12  CREATININE 0.71 0.60  --  0.53 0.63  CALCIUM 7.5*  --   --  7.3* 7.6*  MG  --   --  2.1  --   --    GFR: Estimated Creatinine Clearance: 76.6 mL/min (by C-G formula based on SCr of 0.63 mg/dL). Liver Function Tests:  Recent Labs Lab 06/08/17 1617  AST 42*  ALT 21  ALKPHOS 70  BILITOT 0.8  PROT 7.2  ALBUMIN 2.1*   No results for input(s): LIPASE, AMYLASE in the last 168 hours. No results for input(s): AMMONIA in the last 168 hours. Coagulation  Profile:  Recent Labs Lab 06/09/17 0137  INR 1.55   Cardiac Enzymes: No results for input(s): CKTOTAL, CKMB, CKMBINDEX, TROPONINI in the last 168 hours. BNP (last 3 results) No results for input(s): PROBNP in the last 8760 hours. HbA1C:  Recent Labs  06/09/17 0359  HGBA1C 6.8*   CBG:  Recent Labs Lab 06/09/17 0829 06/09/17 1154 06/09/17 1626 06/09/17 2119 06/10/17 0749  GLUCAP 82 73 96 198* 151*   Lipid Profile: No results for input(s): CHOL, HDL, LDLCALC, TRIG, CHOLHDL, LDLDIRECT in the last 72 hours. Thyroid Function Tests: No results for input(s): TSH, T4TOTAL, FREET4, T3FREE, THYROIDAB in the last 72 hours. Anemia Panel:  Recent Labs  06/09/17 0359  FERRITIN 141   Sepsis Labs:  Recent Labs Lab 06/08/17 1745 06/09/17 0137 06/09/17 0359 06/10/17 0500  PROCALCITON  --  2.11 1.80 6.97  LATICACIDVEN 1.33 1.2  --   --     Recent Results (from the past 240 hour(s))  Blood Culture (routine x 2)     Status: None (Preliminary result)   Collection Time: 06/08/17  4:17 PM  Result Value Ref Range Status   Specimen Description BLOOD  Final   Special Requests   Final    BOTTLES DRAWN AEROBIC AND ANAEROBIC Blood Culture adequate volume   Culture   Final    NO GROWTH < 24 HOURS Performed at [facility], [place] [address], [place], [place] [address]    Report Status PENDING  Incomplete  Blood Culture (routine x 2)     Status: None (Preliminary result)   Collection Time: 06/08/17  4:45 PM  Result Value Ref Range Status   Specimen Description BLOOD BLOOD RIGHT HAND  Final   Special Requests IN PEDIATRIC BOTTLE Blood Culture adequate volume  Final   Culture   Final    NO GROWTH < 24 HOURS Performed at [facility], [place] [address], [place], [place] [address]    Report Status PENDING  Incomplete  MRSA PCR Screening     Status: None   Collection Time: 06/08/17  9:36 PM  Result Value Ref Range Status   MRSA by PCR NEGATIVE NEGATIVE Final    Comment:         The GeneXpert MRSA Assay (FDA approved for NASAL specimens only), is one component of a comprehensive MRSA colonization surveillance program. It is not intended to diagnose MRSA infection nor to guide or monitor treatment for MRSA infections.   Culture, sputum-assessment     Status: None   Collection Time: 06/09/17  3:59 AM  Result Value Ref Range Status   Specimen Description SPU  Final   Special Requests Normal  Final   Sputum evaluation THIS SPECIMEN IS ACCEPTABLE FOR SPUTUM CULTURE  Final   Report Status 06/09/2017 FINAL  Final  Culture, respiratory (NON-Expectorated)     Status: None (Preliminary result)   Collection Time: 06/09/17  3:59 AM  Result Value Ref Range Status   Specimen Description SPU  Final   Special Requests Normal Reflexed from [id number]  Final   Gram Stain   Final    MODERATE WBC PRESENT,BOTH PMN AND MONONUCLEAR RARE SQUAMOUS EPITHELIAL CELLS PRESENT RARE YEAST Performed at [facility], [place] [address] [place], [place] [address]    Culture PENDING  Incomplete   Report Status PENDING  Incomplete         Radiology Studies: Dg Chest 2 View  Result Date: 06/08/2017 CLINICAL DATA:  Leukocytosis, recent treatment for pneumonia. History of diabetes, paraplegia. EXAM: CHEST  2 VIEW COMPARISON:  Chest radiograph March 09, 2014 FINDINGS: Dense consolidation RIGHT upper lobe. No pleural effusion. Cardiac silhouette is normal in size, calcified aortic knob. Apical pleural capping. No pneumothorax. RIGHT PICC distal tip projects in proximal superior vena cava. Moderate degenerative change of the thoracic spine. IMPRESSION: RIGHT upper lobe pneumonia. Followup PA and lateral chest X-ray is recommended in 3-4 weeks following trial of antibiotic therapy to ensure resolution and exclude underlying malignancy. Electronically Signed   By: Elon Alas M.D.   On: 06/08/2017 17:30   Dg Foot Complete Left  Result Date: 06/08/2017 CLINICAL DATA:  Black great  toe.  History of diabetes, paraplegia.  EXAM: LEFT FOOT - COMPLETE 3+ VIEW COMPARISON:  None. FINDINGS: There is no evidence of fracture or dislocation. Thickened fifth metatarsus diaphysis suggests old fracture. Severe osteopenia. There is no evidence of arthropathy or other focal bone abnormality. Soft tissue swelling. Mild vascular calcifications. IMPRESSION: Severe osteopenia without acute osseous process. Soft tissue swelling. Electronically Signed   By: Elon Alas M.D.   On: 06/08/2017 17:34   Dg Foot Complete Right  Addendum Date: 06/09/2017   ADDENDUM REPORT: 06/09/2017 13:24 ADDENDUM: Error in initial impression concerning laterality. Impression should be corrected to state: IMPRESSION: IMPRESSION Suspected bone destruction at the medial aspect, distal phalanx RIGHT great toe concerning for osteomyelitis. Remainder of report as previously dictated. Electronically Signed   By: Lavonia Dana M.D.   On: 06/09/2017 13:24   Result Date: 06/09/2017 CLINICAL DATA:  Black great toe EXAM: RIGHT FOOT COMPLETE - 3+ VIEW COMPARISON:  None FINDINGS: Marked osseous demineralization. Hallux valgus. Prior resection of the PIP joint second toe. Cortical integrity at the medial aspect tuft of distal phalanx appears lost concerning for osteomyelitis. No acute fracture, dislocation or additional bone destruction. Diffuse soft tissue swelling especially at the dorsum of the foot and anterior ankle. Scattered small vessel vascular calcifications. IMPRESSION: Suspected bone destruction at the medial aspect, tuft of distal phalanx LEFT great toe concerning for osteomyelitis. Electronically Signed: By: Lavonia Dana M.D. On: 06/08/2017 17:32        Scheduled Meds: . chlorhexidine  15 mL Mouth/Throat BID  . Chlorhexidine Gluconate Cloth  6 each Topical Daily  . collagenase   Topical Daily  . divalproex  250 mg Oral QHS  . enoxaparin (LOVENOX) injection  40 mg Subcutaneous Q24H  . feeding supplement (GLUCERNA  SHAKE)  237 mL Oral BID BM  . feeding supplement (PRO-STAT SUGAR FREE 64)  30 mL Oral BID  . ferrous sulfate  325 mg Oral BID WC  . fluconazole  200 mg Oral Daily  . furosemide  20 mg Oral Daily  . gabapentin  300 mg Oral TID  . insulin aspart  0-15 Units Subcutaneous TID WC  . insulin detemir  10 Units Subcutaneous Daily   And  . insulin detemir  10 Units Subcutaneous QHS  . ipratropium-albuterol  3 mL Nebulization QID  . latanoprost  1 drop Both Eyes QHS  . loratadine  10 mg Oral q morning - 10a  . metoprolol succinate  25 mg Oral Daily  . oxycodone  30 mg Oral Q6H  . pantoprazole  40 mg Oral Daily  . predniSONE  20 mg Oral Q breakfast  . risperiDONE  1.5 mg Oral QHS  . simvastatin  10 mg Oral QHS  . sodium hypochlorite  1 application Irrigation QPM  . tiZANidine  2 mg Oral BID   Continuous Infusions: . piperacillin-tazobactam (ZOSYN)  IV 3.375 g (06/10/17 0724)  . vancomycin 1,000 mg (06/10/17 0545)     LOS: 2 days      Joclynn Lumb Gerome Apley, MD [facility] Pager [phone]  If 7PM-7AM, please contact night-coverage [email or web address] Password [facility] 06/10/2017, 9:29 AM

## 2017-06-11 LAB — BASIC METABOLIC PANEL
ANION GAP: 5 (ref 5–15)
BUN: 12 mg/dL (ref 6–20)
CO2: 31 mmol/L (ref 22–32)
Calcium: 7.7 mg/dL — ABNORMAL LOW (ref 8.9–10.3)
Chloride: 105 mmol/L (ref 101–111)
Creatinine, Ser: 0.64 mg/dL (ref 0.44–1.00)
GFR calc non Af Amer: >60 mL/min (ref >60)
Glucose, Bld: 183 mg/dL — ABNORMAL HIGH (ref 65–99)
Potassium: 4.2 mmol/L (ref 3.5–5.1)
SODIUM: 141 mmol/L (ref 135–145)

## 2017-06-11 LAB — CBC WITH DIFFERENTIAL/PLATELET
BASOS ABS: 0 10*3/uL (ref 0.0–0.1)
BASOS PCT: 0 %
EOS ABS: 0 10*3/uL (ref 0.0–0.7)
Eosinophils Relative: 0 %
HEMATOCRIT: 23.7 % — AB (ref 36.0–46.0)
HEMOGLOBIN: 7.4 g/dL — AB (ref 12.0–15.0)
Lymphocytes Relative: 15 %
Lymphs Abs: 1.6 10*3/uL (ref 0.7–4.0)
MCH: 23.6 pg — ABNORMAL LOW (ref 26.0–34.0)
MCHC: 31.2 g/dL (ref 30.0–36.0)
MCV: 75.7 fL — ABNORMAL LOW (ref 78.0–100.0)
Monocytes Absolute: 0.8 10*3/uL (ref 0.1–1.0)
Monocytes Relative: 7 %
NEUTROS ABS: 8.4 10*3/uL — AB (ref 1.7–7.7)
NEUTROS PCT: 77 %
Platelets: 380 10*3/uL (ref 150–400)
RBC: 3.13 MIL/uL — ABNORMAL LOW (ref 3.87–5.11)
RDW: 21.4 % — AB (ref 11.5–15.5)
WBC: 10.9 10*3/uL — ABNORMAL HIGH (ref 4.0–10.5)

## 2017-06-11 LAB — GLUCOSE, CAPILLARY
GLUCOSE-CAPILLARY: 138 mg/dL — AB (ref 65–99)
GLUCOSE-CAPILLARY: 306 mg/dL — AB (ref 65–99)
GLUCOSE-CAPILLARY: 221 mg/dL — AB (ref 65–99)
GLUCOSE-CAPILLARY: 241 mg/dL — AB (ref 65–99)

## 2017-06-11 LAB — HEMOGLOBIN AND HEMATOCRIT, BLOOD
HCT: 27.1 % — ABNORMAL LOW (ref 36.0–46.0)
Hemoglobin: 8.4 g/dL — ABNORMAL LOW (ref 12.0–15.0)

## 2017-06-11 LAB — CULTURE, RESPIRATORY: SPECIAL REQUESTS: NORMAL

## 2017-06-11 LAB — CULTURE, RESPIRATORY W GRAM STAIN

## 2017-06-11 MED ORDER — IPRATROPIUM-ALBUTEROL 0.5-2.5 (3) MG/3ML IN SOLN: 3.0000 mL | Freq: Three times a day (TID) | RESPIRATORY_TRACT | Status: DC

## 2017-06-11 MED ORDER — IPRATROPIUM-ALBUTEROL 0.5-2.5 (3) MG/3ML IN SOLN
3.0000 mL | Freq: Two times a day (BID) | RESPIRATORY_TRACT | Status: DC
  Administered 2017-06-11 – 2017-06-15 (×8): 3 mL via RESPIRATORY_TRACT
  Filled 2017-06-11 (×8): qty 3

## 2017-06-11 NOTE — Progress Notes (Signed)
PROGRESS NOTE    Shannon Schmidt  RSW:[id number] DOB: [birth year] DOA: 06/08/2017 PCP: Theressa Millard, MD    Brief Narrative:  76 year old female who presented with low hemoglobin. Patient is known to have COPD, type 2 diabetes mellitus, chronic feet osteomyelitis and recent history of pneumonia pneumonia. Has been on multiple antibiotics including cefepime, vancomycin and levofloxacin. She developed persistent, productive cough. She was found to have a low hemoglobin down to 6.3 at the skilled nursing facility, she was transferred to the hospital for further management including packed red blood cell transfusion. On initial physical examination blood pressure 122/60, heart rate 92, respiratory rate 20, oxygen saturation 95%, temperature 98.7. Her lungs had diffuse rhonchi bilaterally with coarse breath sounds, heart S1-S2 present rhythmic, no gallops, rubs or murmurs, the abdomen was soft, nontender, no organomegaly, no lower extremity edema. Multiple pressure ulcers, bilateral feet, and sacral. Sodium 133, potassium 5.1, chloride 96, bicarbonate 29, glucose 83, BUN 12, creatinine 0.71, AST 42, ALT 21, white count 16.2, hemoglobin 6.8, platelets 405, urine analysis negative for infection, fecal occult blood negative. Chest x-ray, large alveolar infiltrate in the right upper lobe, new compared to chest x-ray from April. Foot x-ray with bone instruction of the medial aspect of the distal phalanx of the left great toe concerning for osteomyelitis.   Patient was admitted to the hospital with working diagnosis of acute symptomatic anemia complicated by a right upper lobe pneumonia and left foot great toe osteomyelitis. No signs of bleeding, stable hb after 2 units prbc. No available outpatient record, follow on foot MRI.    Assessment & Plan:   Principal Problem:   Sepsis ([place]) Active Problems:   Diabetes mellitus ([place])   Decubitus ulcers   HTN (hypertension)   Anemia   HCAP  (healthcare-associated pneumonia)   Osteomyelitis (HCC)   Oral thrush   Severe malnutrition (HCC)   Hypocalcemia   1. Symptomatic multifactorial anemia.  SP blood transfusion, 2 units prbc. No further signs of bleeding, will continue with iron supplements.   2. Right upper lobe pneumonia, present on admissio. Antibiotic therapy with IV zosyn, oxygenating well at 99% on 2 LPM per nasal cannula, will continue aspiration precautions, recommended dysphagia 3 per speech therapy. Still waiting for records from nursing facility, about duration of antibiotic therapy prior to hospitalization. Patient has a picc line.   3. Left foot osteomyelitis. Patient denies knowing about chronic bone foot infection, imaging on admission suggestive osteomyelitis, will order MRI today to confirm diagnosis, for now, will continue on IV vancomycin, will continue to wait for records from the outpatient setting.  4. Multiple decubitus ulcers.  Continue local wound care. Out of bed as tolerated and physical therapy evaluation.   5. Type 2 diabetes mellitus. Insulin sliding scale for glucose cover and monitoring, capillary glucose 193, 270, 138, 306. AM fasting serum glucose 183, basal insulin regimen with 10 units bid of levimir. Patient on prednisone on admission, may need a slow taper, need records from outpatient.   6. Hypertension. Continue with Furosemide and metoprolol succinate. Systolic blood pressure 093'G  7. Depression. Continue risperidone and dapakote. No agitation, continue neuro checks per unit protocol.   DVT prophylaxis:scd Code Status:DNR Family Communication: Disposition Plan:   Consultants:    Procedures: Procedures:    Antimicrobials:   Vancomycin  Zosyn    Subjective: Patient with positive cough, no nausea or vomiting, no chest pain. No bleeding. Per patient, she was on antibiotics at the snf for pneumonia and did not  know anything about a foot infection. Records  requested, but no answer yet.   Objective: Vitals:   06/10/17 1957 06/10/17 2043 06/11/17 0538 06/11/17 0747  BP:  133/77 (!) 146/86   Pulse:  89 82   Resp:  16 18   Temp:  98.4 F (36.9 C) 97.9 F (36.6 C)   TempSrc:  Oral Oral   SpO2: 98% 96% 99% 96%  Weight:      Height:        Intake/Output Summary (Last 24 hours) at 06/11/17 0840 Last data filed at 06/10/17 2044  Gross per 24 hour  Intake             1490 ml  Output             1700 ml  Net             -210 ml   Filed Weights   06/08/17 1606 06/08/17 2216  Weight: 89.4 kg (197 lb) 93.7 kg (206 lb 9.1 oz)    Examination:  General exam: deconditioned E ENT: mild pallor, no icterus, oral mucosa moist.  Respiratory system: Rales at the upper lobe on the right. No wheezing, or rhonchi. Respiratory effort normal. Cardiovascular system: S1 & S2 heard, RRR. No JVD, murmurs, rubs, gallops or clicks. No pedal edema. Gastrointestinal system: Abdomen is nondistended, soft and nontender. No organomegaly or masses felt. Normal bowel sounds heard. Central nervous system: Alert and oriented. No focal neurological deficits. Extremities: Symmetric 5 x 5 power. Skin:  Right foot with dark discoloration, of the skin, necrotic tissue.      Data Reviewed: I have personally reviewed following labs and imaging studies  CBC:  Recent Labs Lab 06/08/17 1617 06/08/17 1629 06/09/17 0359 06/09/17 1524 06/10/17 0500 06/11/17 0417  WBC 16.2*  --  10.8* 15.0* 11.8* 10.9*  NEUTROABS 13.5*  --  8.3* 13.9* 9.7* 8.4*  HGB 6.8* 7.5* 6.9* 8.1* 8.3* 7.4*  HCT 22.3* 22.0* 21.8* 25.8* 25.8* 23.7*  MCV 70.6*  --  73.6* 73.5* 74.4* 75.7*  PLT 405*  --  356 375 406* 283   Basic Metabolic Panel:  Recent Labs Lab 06/08/17 1617 06/08/17 1629 06/09/17 0137 06/09/17 0359 06/10/17 0500 06/11/17 0417  NA 133* 131*  --  137 135 141  K 5.1 6.0*  --  3.8 3.9 4.2  CL 96* 94*  --  103 99* 105  CO2 29  --   --  27 30 31   GLUCOSE 83 88  --  90  180* 183*  BUN 12 14  --  11 12 12   CREATININE 0.71 0.60  --  0.53 0.63 0.64  CALCIUM 7.5*  --   --  7.3* 7.6* 7.7*  MG  --   --  2.1  --   --   --    GFR: Estimated Creatinine Clearance: 76.6 mL/min (by C-G formula based on SCr of 0.64 mg/dL). Liver Function Tests:  Recent Labs Lab 06/08/17 1617  AST 42*  ALT 21  ALKPHOS 70  BILITOT 0.8  PROT 7.2  ALBUMIN 2.1*   No results for input(s): LIPASE, AMYLASE in the last 168 hours. No results for input(s): AMMONIA in the last 168 hours. Coagulation Profile:  Recent Labs Lab 06/09/17 0137  INR 1.55   Cardiac Enzymes: No results for input(s): CKTOTAL, CKMB, CKMBINDEX, TROPONINI in the last 168 hours. BNP (last 3 results) No results for input(s): PROBNP in the last 8760 hours. HbA1C:  Recent Labs  06/09/17  0359  HGBA1C 6.8*   CBG:  Recent Labs Lab 06/10/17 0749 06/10/17 1147 06/10/17 1701 06/10/17 2036 06/11/17 0759  GLUCAP 151* 214* 193* 270* 138*   Lipid Profile: No results for input(s): CHOL, HDL, LDLCALC, TRIG, CHOLHDL, LDLDIRECT in the last 72 hours. Thyroid Function Tests: No results for input(s): TSH, T4TOTAL, FREET4, T3FREE, THYROIDAB in the last 72 hours. Anemia Panel:  Recent Labs  06/09/17 0359  FERRITIN 141   Sepsis Labs:  Recent Labs Lab 06/08/17 1745 06/09/17 0137 06/09/17 0359 06/10/17 0500  PROCALCITON  --  2.11 1.80 6.97  LATICACIDVEN 1.33 1.2  --   --     Recent Results (from the past 240 hour(s))  Blood Culture (routine x 2)     Status: None (Preliminary result)   Collection Time: 06/08/17  4:17 PM  Result Value Ref Range Status   Specimen Description BLOOD  Final   Special Requests   Final    BOTTLES DRAWN AEROBIC AND ANAEROBIC Blood Culture adequate volume   Culture   Final    NO GROWTH 2 DAYS Performed at [facility], [place] [address] [place], [place] [address]    Report Status PENDING  Incomplete  Blood Culture (routine x 2)     Status: None (Preliminary result)    Collection Time: 06/08/17  4:45 PM  Result Value Ref Range Status   Specimen Description BLOOD BLOOD RIGHT HAND  Final   Special Requests IN PEDIATRIC BOTTLE Blood Culture adequate volume  Final   Culture   Final    NO GROWTH 2 DAYS Performed at [facility], [place] [address], [place], [place] [address]    Report Status PENDING  Incomplete  Urine culture     Status: None   Collection Time: 06/08/17  6:23 PM  Result Value Ref Range Status   Specimen Description URINE, CATHETERIZED  Final   Special Requests Normal  Final   Culture   Final    NO GROWTH Performed at [facility], [place] [address], [place], [place] [address]    Report Status 06/10/2017 FINAL  Final  MRSA PCR Screening     Status: None   Collection Time: 06/08/17  9:36 PM  Result Value Ref Range Status   MRSA by PCR NEGATIVE NEGATIVE Final    Comment:        The GeneXpert MRSA Assay (FDA approved for NASAL specimens only), is one component of a comprehensive MRSA colonization surveillance program. It is not intended to diagnose MRSA infection nor to guide or monitor treatment for MRSA infections.   Culture, sputum-assessment     Status: None   Collection Time: 06/09/17  3:59 AM  Result Value Ref Range Status   Specimen Description SPU  Final   Special Requests Normal  Final   Sputum evaluation THIS SPECIMEN IS ACCEPTABLE FOR SPUTUM CULTURE  Final   Report Status 06/09/2017 FINAL  Final  Culture, respiratory (NON-Expectorated)     Status: None (Preliminary result)   Collection Time: 06/09/17  3:59 AM  Result Value Ref Range Status   Specimen Description SPU  Final   Special Requests Normal Reflexed from [id number]  Final   Gram Stain   Final    MODERATE WBC PRESENT,BOTH PMN AND MONONUCLEAR RARE SQUAMOUS EPITHELIAL CELLS PRESENT RARE YEAST Performed at [facility], [place] [address], [place], [place] [address]    Culture FEW CANDIDA ALBICANS  Final   Report Status PENDING  Incomplete  Radiology Studies: No results found.      Scheduled Meds: . chlorhexidine  15 mL Mouth/Throat BID  . Chlorhexidine Gluconate Cloth  6 each Topical Daily  . collagenase   Topical Daily  . divalproex  250 mg Oral QHS  . enoxaparin (LOVENOX) injection  40 mg Subcutaneous Q24H  . feeding supplement (GLUCERNA SHAKE)  237 mL Oral BID BM  . feeding supplement (PRO-STAT SUGAR FREE 64)  30 mL Oral BID  . ferrous sulfate  325 mg Oral BID WC  . fluconazole  200 mg Oral Daily  . furosemide  20 mg Oral Daily  . gabapentin  300 mg Oral TID  . insulin aspart  0-15 Units Subcutaneous TID WC  . insulin detemir  10 Units Subcutaneous Daily   And  . insulin detemir  10 Units Subcutaneous QHS  . ipratropium-albuterol  3 mL Nebulization QID  . latanoprost  1 drop Both Eyes QHS  . loratadine  10 mg Oral q morning - 10a  . metoprolol succinate  25 mg Oral Daily  . oxycodone  30 mg Oral Q6H  . pantoprazole  40 mg Oral Daily  . predniSONE  20 mg Oral Q breakfast  . risperiDONE  1.5 mg Oral QHS  . simvastatin  10 mg Oral QHS  . sodium hypochlorite  1 application Irrigation QPM  . tiZANidine  2 mg Oral BID   Continuous Infusions: . piperacillin-tazobactam (ZOSYN)  IV Stopped (06/11/17 0348)  . vancomycin Stopped (06/11/17 0548)     LOS: 3 days        Aemilia Dedrick Gerome Apley, MD [facility] Pager [phone]  If 7PM-7AM, please contact night-coverage [email or web address] Password [email or web address] 06/11/2017, 8:40 AM

## 2017-06-12 ENCOUNTER — Inpatient Hospital Stay (HOSPITAL_COMMUNITY): Payer: Medicare Other

## 2017-06-12 DIAGNOSIS — D509 Iron deficiency anemia, unspecified: Secondary | ICD-10-CM

## 2017-06-12 LAB — GLUCOSE, CAPILLARY
GLUCOSE-CAPILLARY: 229 mg/dL — AB (ref 65–99)
GLUCOSE-CAPILLARY: 257 mg/dL — AB (ref 65–99)
Glucose-Capillary: 161 mg/dL — ABNORMAL HIGH (ref 65–99)
Glucose-Capillary: 340 mg/dL — ABNORMAL HIGH (ref 65–99)

## 2017-06-12 LAB — CREATININE, SERUM
Creatinine, Ser: 0.57 mg/dL (ref 0.44–1.00)
GFR calc non Af Amer: >60 mL/min (ref >60)

## 2017-06-12 MED ORDER — SODIUM CHLORIDE 0.9% FLUSH
10.0000 mL | INTRAVENOUS | Status: DC | PRN
  Administered 2017-06-13: 10 mL
  Filled 2017-06-12: qty 40

## 2017-06-12 MED ORDER — GADOBENATE DIMEGLUMINE 529 MG/ML IV SOLN
20.0000 mL | Freq: Once | INTRAVENOUS | Status: AC | PRN
  Administered 2017-06-12: 19 mL via INTRAVENOUS

## 2017-06-12 MED ORDER — PREDNISONE 5 MG PO TABS
10.0000 mg | ORAL_TABLET | Freq: Every day | ORAL | Status: DC
  Administered 2017-06-13 – 2017-06-15 (×3): 10 mg via ORAL
  Filled 2017-06-12 (×3): qty 2

## 2017-06-12 NOTE — Consult Note (Signed)
Patient ID: Shannon Schmidt MRN: [id number] DOB/AGE: [birth year] 76 y.o.  Admit date: 06/08/2017  Admission Diagnoses:  Principal Problem:   Sepsis ([place]) Active Problems:   Diabetes mellitus ([place])   Decubitus ulcers   HTN (hypertension)   Anemia   HCAP (healthcare-associated pneumonia)   Osteomyelitis (HCC)   Oral thrush   Severe malnutrition (HCC)   Hypocalcemia   HPI: Pleasant 76 year old female pt with a past medical hx of poor health and chronic bedridden status.  Pt was sent to the hospital from SNF because of low hemoglobin.  We were consulted about her right 1st metatarsal.  The pt denies pain.  The pt reports swelling of that foot.  The pt is already on broad spectrum anx because of a recent hx of pneumonia.   Past Medical History: Past Medical History:  Diagnosis Date  . Anemia   . Bipolar 1 disorder ([place])   . Colostomy in place [facility])   . COPD (chronic obstructive pulmonary disease) ([place])   . Depression   . Diabetes mellitus   . GERD (gastroesophageal reflux disease)   . H/O hiatal hernia   . Muscle weakness   . Neuropathy   . Osteomyelitis ([place])   . Paraplegia ([place])   . PONV (postoperative nausea and vomiting)   . RA (rheumatoid arthritis) ([place])   . Schizophrenia [facility])     Surgical History: Past Surgical History:  Procedure Laterality Date  . ABDOMINAL HYSTERECTOMY    . BREAST SURGERY     L breast abscess  . CATARACT EXTRACTION W/PHACO  05/09/2012   Procedure: CATARACT EXTRACTION PHACO AND INTRAOCULAR LENS PLACEMENT (IOC);  Surgeon: Marylynn Pearson, MD;  Location: [facility];  Service: Ophthalmology;  Laterality: Right;  . COLON SURGERY    . EYE SURGERY     L cataract  . GANGLION CYST EXCISION     R wrist x2  . HERNIA REPAIR     abdominal  . JOINT REPLACEMENT     knee  . KNEE SURGERY    . WOUND DEBRIDEMENT      Family History: Family History  Problem Relation Age of Onset  . Hypertension Mother   . Diabetes type II Mother   . Stroke Father     . Cancer Other     Social History: Social History   Social History  . Marital status: Divorced    Spouse name: N/A  . Number of children: N/A  . Years of education: N/A   Occupational History  . retired    Social History Main Topics  . Smoking status: Former Smoker    Packs/day: 2.00    Years: 40.00    Quit date: 2006  . Smokeless tobacco: Never Used  . Alcohol use No  . Drug use: No  . Sexual activity: Not on file   Other Topics Concern  . Not on file   Social History Narrative  . No narrative on file    Allergies: Ace inhibitors; Chlorpromazine; and Haloperidol decanoate  Medications: I have reviewed the patient's current medications.  Vital Signs: Patient Vitals for the past 24 hrs:  BP Temp Temp src Pulse Resp SpO2  06/12/17 0759 - - - 94 18 (!) 88 %  06/12/17 0442 (!) 178/76 98.6 F (37 C) Oral 96 20 99 %  06/11/17 2045 - - - 98 18 98 %  06/11/17 2044 (!) 171/82 99.3 F (37.4 C) Oral 98 18 98 %  06/11/17 1431 139/68 98.6 F (  37 C) Oral 84 19 95 %    Radiology: Dg Chest 2 View  Result Date: 06/08/2017 CLINICAL DATA:  Leukocytosis, recent treatment for pneumonia. History of diabetes, paraplegia. EXAM: CHEST  2 VIEW COMPARISON:  Chest radiograph March 09, 2014 FINDINGS: Dense consolidation RIGHT upper lobe. No pleural effusion. Cardiac silhouette is normal in size, calcified aortic knob. Apical pleural capping. No pneumothorax. RIGHT PICC distal tip projects in proximal superior vena cava. Moderate degenerative change of the thoracic spine. IMPRESSION: RIGHT upper lobe pneumonia. Followup PA and lateral chest X-ray is recommended in 3-4 weeks following trial of antibiotic therapy to ensure resolution and exclude underlying malignancy. Electronically Signed   By: Elon Alas M.D.   On: 06/08/2017 17:30   Dg Foot Complete Left  Result Date: 06/08/2017 CLINICAL DATA:  Black great toe.  History of diabetes, paraplegia. EXAM: LEFT FOOT - COMPLETE 3+ VIEW  COMPARISON:  None. FINDINGS: There is no evidence of fracture or dislocation. Thickened fifth metatarsus diaphysis suggests old fracture. Severe osteopenia. There is no evidence of arthropathy or other focal bone abnormality. Soft tissue swelling. Mild vascular calcifications. IMPRESSION: Severe osteopenia without acute osseous process. Soft tissue swelling. Electronically Signed   By: Elon Alas M.D.   On: 06/08/2017 17:34   Dg Foot Complete Right  Addendum Date: 06/09/2017   ADDENDUM REPORT: 06/09/2017 13:24 ADDENDUM: Error in initial impression concerning laterality. Impression should be corrected to state: IMPRESSION: IMPRESSION Suspected bone destruction at the medial aspect, distal phalanx RIGHT great toe concerning for osteomyelitis. Remainder of report as previously dictated. Electronically Signed   By: Lavonia Dana M.D.   On: 06/09/2017 13:24   Result Date: 06/09/2017 CLINICAL DATA:  Black great toe EXAM: RIGHT FOOT COMPLETE - 3+ VIEW COMPARISON:  None FINDINGS: Marked osseous demineralization. Hallux valgus. Prior resection of the PIP joint second toe. Cortical integrity at the medial aspect tuft of distal phalanx appears lost concerning for osteomyelitis. No acute fracture, dislocation or additional bone destruction. Diffuse soft tissue swelling especially at the dorsum of the foot and anterior ankle. Scattered small vessel vascular calcifications. IMPRESSION: Suspected bone destruction at the medial aspect, tuft of distal phalanx LEFT great toe concerning for osteomyelitis. Electronically Signed: By: Lavonia Dana M.D. On: 06/08/2017 17:32    Labs:  Recent Labs  06/10/17 0500 06/11/17 0417 06/11/17 0900  WBC 11.8* 10.9*  --   RBC 3.47* 3.13*  --   HCT 25.8* 23.7* 27.1*  PLT 406* 380  --     Recent Labs  06/10/17 0500 06/11/17 0417 06/12/17 0422  NA 135 141  --   K 3.9 4.2  --   CL 99* 105  --   CO2 30 31  --   BUN 12 12  --   CREATININE 0.63 0.64 0.57  GLUCOSE 180*  183*  --   CALCIUM 7.6* 7.7*  --    No results for input(s): LABPT, INR in the last 72 hours.  Review of Systems: ROS  Physical Exam: ABD soft Sensation intact distally  No movement of b/l feet Edema noted of b/l LE Erythema noted right 1st metatarsal, no discharge Viewed Sacral wound, no signs of infection Multiple healed previous buttock wounds Pt answers questions and follows commands  Assessment and Plan: Right Foot MRI pending  If no abscess noted on MRI no need for immediate surgical intervention Continue f/u with Wound Clinic Recommend f/u with Dr. Meridee Score - orthopedics Continue ongoing medical management Dr. Rolena Infante will consult with Dr.  Sallee Provencal, PAC for Melina Schools, MD [place] [phone]

## 2017-06-12 NOTE — Progress Notes (Signed)
   06/12/17 1200  Clinical Encounter Type  Visited With Patient  Visit Type Initial;Psychological support;Spiritual support  Referral From Physician  Consult/Referral To Chaplain  Spiritual Encounters  Spiritual Needs Emotional;Other (Comment);[employer] Special educational needs teacher)  Stress Factors  Patient Stress Factors Other (Comment) (Advance Directive Information)   I visited with the patient per Spiritual Care consult. The patient was a little confused during our conversation. She stated that she has a DNR in place and was told about a Living Will. She could not understand the difference between the two orders, but kept saying that she "doesn't want to be pumped on."  I left the paperwork with the patient, but cannot complete any documents until the patient is completely able to make decisions clearly and understand what she is signing.   Please, contact Spiritual Care for further assistance.   [place] M.Div.

## 2017-06-12 NOTE — Progress Notes (Signed)
Patient ID: Shannon Schmidt, female   DOB: 12-May-1941, 76 y.o.   MRN: [id number]  PROGRESS NOTE    AYLYN WENZLER  ELT:[id number] DOB: [birth year] DOA: 06/08/2017 PCP: Theressa Millard, MD   Brief Narrative:  76 y.o female with history of COPD, diabetes mellitus type 2, chronic feet osteomyelitis, chronic bedridden status, chronic decubitus ulcers and recent history of pneumonia who is been on various antibiotics including cefepime and vancomycin for a week several weeks ago and more recently was on Levaquin from 06/04/2017 to be scheduled to 06/14/2017 along with prednisone taper started on 05/21/2017 for 20 days was sent from nursing home for evaluation of hemoglobin 6.3 and need for blood transfusion. She received 2 units of packed red cells. She was also found to have new right upper lobe infiltrate and right foot x-ray showed 1 destruction of the medial aspect of distal phalanx of the right great toe. She is on broad-spectrum antibiotics.   Assessment & Plan:   Principal Problem:   Sepsis ([place]) Active Problems:   Diabetes mellitus ([place])   Decubitus ulcers   HTN (hypertension)   Anemia   HCAP (healthcare-associated pneumonia)   Osteomyelitis ([facility])   Oral thrush   Severe malnutrition ([facility])   Hypocalcemia   1. Symptomatic multifactorial anemia. SP blood transfusion, 2 units prbc. No further signs of bleeding, will continue with iron supplements.  - Hemoglobin stable  2. Right upper lobe pneumonia, present on admissio.  - Currently on Zosyn and vancomycin. Cultures negative so far. Respiratory Status stable. Continue oxygen supplementation via nasal cannula. Finish 7 day of antibiotic treatment. -Aspiration precautions. Diet as per SLP recommendations - Taper prednisone  3. Right foot osteomyelitis. - Patient probably has chronic osteomyelitis of both feet because of her chronic bedridden status. It is unclear if the right foot osteomyelitis is acute on chronic. Currently on  broad-spectrum antibiotics Zosyn and vancomycin; MRI of the right foot is pending. Spoke to orthopedics on call regarding the patient; will follow with his recommendations. - Check ESR and CRP in the morning  4. Multiple stage IV decubitus ulcers. Continue local wound care and follow recommendations from wound care nurse.  5. Type 2 diabetes mellitus. - Continue sliding scale insulin along with long-acting and short-acting insulin.  6. Hypertension. Continue with Furosemide and metoprolol succinate. Blood pressure on the higher side; we'll monitor  7. Depression. Continue risperidone and depakote. No agitation, continue neuro checks per unit protocol.   8. Bedridden status - Fall precautions.  DVT prophylaxis:SCDs Code Status:DNR Family Communication:None at bedside Disposition Plan:Nursing home in 1-3 days   Consultants:  Called orthopedics consult today  Procedures: None    Antimicrobials:   Vancomycin and Zosyn from 06/08/2017 onwards  Diflucan from 06/08/2017 onwards  Subjective: Patient seen and examined at bedside. She is awake and answers some questions but is a poor historian. She denies any overnight fever, nausea or vomiting.  Objective: Vitals:   06/11/17 2044 06/11/17 2045 06/12/17 0442 06/12/17 0759  BP: (!) 171/82  (!) 178/76   Pulse: 98 98 96 94  Resp: 18 18 20 18   Temp: 99.3 F (37.4 C)  98.6 F (37 C)   TempSrc: Oral  Oral   SpO2: 98% 98% 99% (!) 88%  Weight:      Height:        Intake/Output Summary (Last 24 hours) at 06/12/17 1025 Last data filed at 06/12/17 0442  Gross per 24 hour  Intake  1950 ml  Output             3400 ml  Net            -1450 ml   Filed Weights   06/08/17 1606 06/08/17 2216  Weight: 89.4 kg (197 lb) 93.7 kg (206 lb 9.1 oz)    Examination:  General exam: Appears calm and comfortable  Respiratory system: Bilateral decreased breath sound at bases With scattered crackles Cardiovascular  system: S1 & S2 heard, rate controlled  Gastrointestinal system: Abdomen is nondistended, soft and nontender. Normal bowel sounds heard. Left-sided colostomy present Extremities: No cyanosis, clubbing; bilateral lower extremity 1+ edema with right great toe showing dark discoloration of the skin  Data Reviewed: I have personally reviewed following labs and imaging studies  CBC:  Recent Labs Lab 06/08/17 1617  06/09/17 0359 06/09/17 1524 06/10/17 0500 06/11/17 0417 06/11/17 0900  WBC 16.2*  --  10.8* 15.0* 11.8* 10.9*  --   NEUTROABS 13.5*  --  8.3* 13.9* 9.7* 8.4*  --   HGB 6.8*  < > 6.9* 8.1* 8.3* 7.4* 8.4*  HCT 22.3*  < > 21.8* 25.8* 25.8* 23.7* 27.1*  MCV 70.6*  --  73.6* 73.5* 74.4* 75.7*  --   PLT 405*  --  356 375 406* 380  --   < > = values in this interval not displayed. Basic Metabolic Panel:  Recent Labs Lab 06/08/17 1617 06/08/17 1629 06/09/17 0137 06/09/17 0359 06/10/17 0500 06/11/17 0417 06/12/17 0422  NA 133* 131*  --  137 135 141  --   K 5.1 6.0*  --  3.8 3.9 4.2  --   CL 96* 94*  --  103 99* 105  --   CO2 29  --   --  27 30 31   --   GLUCOSE 83 88  --  90 180* 183*  --   BUN 12 14  --  11 12 12   --   CREATININE 0.71 0.60  --  0.53 0.63 0.64 0.57  CALCIUM 7.5*  --   --  7.3* 7.6* 7.7*  --   MG  --   --  2.1  --   --   --   --    GFR: Estimated Creatinine Clearance: 76.6 mL/min (by C-G formula based on SCr of 0.57 mg/dL). Liver Function Tests:  Recent Labs Lab 06/08/17 1617  AST 42*  ALT 21  ALKPHOS 70  BILITOT 0.8  PROT 7.2  ALBUMIN 2.1*   No results for input(s): LIPASE, AMYLASE in the last 168 hours. No results for input(s): AMMONIA in the last 168 hours. Coagulation Profile:  Recent Labs Lab 06/09/17 0137  INR 1.55   Cardiac Enzymes: No results for input(s): CKTOTAL, CKMB, CKMBINDEX, TROPONINI in the last 168 hours. BNP (last 3 results) No results for input(s): PROBNP in the last 8760 hours. HbA1C: No results for input(s):  HGBA1C in the last 72 hours. CBG:  Recent Labs Lab 06/11/17 0759 06/11/17 1158 06/11/17 1729 06/11/17 2024 06/12/17 0715  GLUCAP 138* 306* 221* 241* 161*   Lipid Profile: No results for input(s): CHOL, HDL, LDLCALC, TRIG, CHOLHDL, LDLDIRECT in the last 72 hours. Thyroid Function Tests: No results for input(s): TSH, T4TOTAL, FREET4, T3FREE, THYROIDAB in the last 72 hours. Anemia Panel: No results for input(s): VITAMINB12, FOLATE, FERRITIN, TIBC, IRON, RETICCTPCT in the last 72 hours. Sepsis Labs:  Recent Labs Lab 06/08/17 1745 06/09/17 0137 06/09/17 0359 06/10/17 0500  PROCALCITON  --  2.11 1.80 6.97  LATICACIDVEN 1.33 1.2  --   --     Recent Results (from the past 240 hour(s))  Blood Culture (routine x 2)     Status: None (Preliminary result)   Collection Time: 06/08/17  4:17 PM  Result Value Ref Range Status   Specimen Description BLOOD  Final   Special Requests   Final    BOTTLES DRAWN AEROBIC AND ANAEROBIC Blood Culture adequate volume   Culture   Final    NO GROWTH 3 DAYS Performed at [facility], [address], [place], [place] [address]    Report Status PENDING  Incomplete  Blood Culture (routine x 2)     Status: None (Preliminary result)   Collection Time: 06/08/17  4:45 PM  Result Value Ref Range Status   Specimen Description BLOOD BLOOD RIGHT HAND  Final   Special Requests IN PEDIATRIC BOTTLE Blood Culture adequate volume  Final   Culture   Final    NO GROWTH 3 DAYS Performed at [facility], [place] [address] [place], [place] [address]    Report Status PENDING  Incomplete  Urine culture     Status: None   Collection Time: 06/08/17  6:23 PM  Result Value Ref Range Status   Specimen Description URINE, CATHETERIZED  Final   Special Requests Normal  Final   Culture   Final    NO GROWTH Performed at [facility], [place] [address] [place], [place] [address]    Report Status 06/10/2017 FINAL  Final  MRSA PCR Screening     Status: None     Collection Time: 06/08/17  9:36 PM  Result Value Ref Range Status   MRSA by PCR NEGATIVE NEGATIVE Final    Comment:        The GeneXpert MRSA Assay (FDA approved for NASAL specimens only), is one component of a comprehensive MRSA colonization surveillance program. It is not intended to diagnose MRSA infection nor to guide or monitor treatment for MRSA infections.   Culture, sputum-assessment     Status: None   Collection Time: 06/09/17  3:59 AM  Result Value Ref Range Status   Specimen Description SPU  Final   Special Requests Normal  Final   Sputum evaluation THIS SPECIMEN IS ACCEPTABLE FOR SPUTUM CULTURE  Final   Report Status 06/09/2017 FINAL  Final  Culture, respiratory (NON-Expectorated)     Status: None   Collection Time: 06/09/17  3:59 AM  Result Value Ref Range Status   Specimen Description SPU  Final   Special Requests Normal Reflexed from [id number]  Final   Gram Stain   Final    MODERATE WBC PRESENT,BOTH PMN AND MONONUCLEAR RARE SQUAMOUS EPITHELIAL CELLS PRESENT RARE YEAST Performed at [facility], [place] [address], [place], [place] [address]    Culture FEW CANDIDA ALBICANS  Final   Report Status 06/11/2017 FINAL  Final         Radiology Studies: No results found.      Scheduled Meds: . chlorhexidine  15 mL Mouth/Throat BID  . Chlorhexidine Gluconate Cloth  6 each Topical Daily  . collagenase   Topical Daily  . divalproex  250 mg Oral QHS  . enoxaparin (LOVENOX) injection  40 mg Subcutaneous Q24H  . feeding supplement (GLUCERNA SHAKE)  237 mL Oral BID BM  . feeding supplement (PRO-STAT SUGAR FREE 64)  30 mL Oral BID  . ferrous sulfate  325 mg Oral BID WC  .  fluconazole  200 mg Oral Daily  . furosemide  20 mg Oral Daily  . gabapentin  300 mg Oral TID  . insulin aspart  0-15 Units Subcutaneous TID WC  . insulin detemir  10 Units Subcutaneous Daily   And  . insulin detemir  10 Units Subcutaneous QHS  . ipratropium-albuterol  3 mL Nebulization  BID  . latanoprost  1 drop Both Eyes QHS  . loratadine  10 mg Oral q morning - 10a  . metoprolol succinate  25 mg Oral Daily  . oxycodone  30 mg Oral Q6H  . pantoprazole  40 mg Oral Daily  . predniSONE  20 mg Oral Q breakfast  . risperiDONE  1.5 mg Oral QHS  . simvastatin  10 mg Oral QHS  . tiZANidine  2 mg Oral BID   Continuous Infusions: . piperacillin-tazobactam (ZOSYN)  IV 3.375 g (06/12/17 0823)  . vancomycin 1,000 mg (06/12/17 0554)     LOS: 4 days        Aline August, MD [facility] Pager [phone]  If 7PM-7AM, please contact night-coverage [email or web address] Password [facility] 06/12/2017, 10:25 AM

## 2017-06-13 DIAGNOSIS — D649 Anemia, unspecified: Secondary | ICD-10-CM

## 2017-06-13 DIAGNOSIS — E43 Unspecified severe protein-calorie malnutrition: Secondary | ICD-10-CM

## 2017-06-13 DIAGNOSIS — J189 Pneumonia, unspecified organism: Secondary | ICD-10-CM

## 2017-06-13 DIAGNOSIS — Z86010 Personal history of colonic polyps: Secondary | ICD-10-CM

## 2017-06-13 DIAGNOSIS — M869 Osteomyelitis, unspecified: Secondary | ICD-10-CM

## 2017-06-13 DIAGNOSIS — E11628 Type 2 diabetes mellitus with other skin complications: Secondary | ICD-10-CM

## 2017-06-13 DIAGNOSIS — Z794 Long term (current) use of insulin: Secondary | ICD-10-CM

## 2017-06-13 DIAGNOSIS — L89304 Pressure ulcer of unspecified buttock, stage 4: Secondary | ICD-10-CM

## 2017-06-13 LAB — COMPREHENSIVE METABOLIC PANEL
ALK PHOS: 77 U/L (ref 38–126)
ALT: 17 U/L (ref 14–54)
ANION GAP: 7 (ref 5–15)
AST: 15 U/L (ref 15–41)
Albumin: 1.8 g/dL — ABNORMAL LOW (ref 3.5–5.0)
BUN: 10 mg/dL (ref 6–20)
CALCIUM: 7.9 mg/dL — AB (ref 8.9–10.3)
CO2: 33 mmol/L — AB (ref 22–32)
Chloride: 98 mmol/L — ABNORMAL LOW (ref 101–111)
Creatinine, Ser: 0.69 mg/dL (ref 0.44–1.00)
GFR calc non Af Amer: >60 mL/min (ref >60)
Glucose, Bld: 213 mg/dL — ABNORMAL HIGH (ref 65–99)
Potassium: 4.1 mmol/L (ref 3.5–5.1)
SODIUM: 138 mmol/L (ref 135–145)
Total Bilirubin: 0.2 mg/dL — ABNORMAL LOW (ref 0.3–1.2)
Total Protein: 5.9 g/dL — ABNORMAL LOW (ref 6.5–8.1)

## 2017-06-13 LAB — CULTURE, BLOOD (ROUTINE X 2)
Culture: NO GROWTH
Culture: NO GROWTH
Special Requests: ADEQUATE
Special Requests: ADEQUATE

## 2017-06-13 LAB — OCCULT BLOOD X 1 CARD TO LAB, STOOL: Fecal Occult Bld: NEGATIVE

## 2017-06-13 LAB — C-REACTIVE PROTEIN: CRP: 8.2 mg/dL — AB (ref <1.0)

## 2017-06-13 LAB — PREPARE RBC (CROSSMATCH)

## 2017-06-13 LAB — GLUCOSE, CAPILLARY
GLUCOSE-CAPILLARY: 275 mg/dL — AB (ref 65–99)
GLUCOSE-CAPILLARY: 263 mg/dL — AB (ref 65–99)
Glucose-Capillary: 322 mg/dL — ABNORMAL HIGH (ref 65–99)

## 2017-06-13 LAB — IRON AND TIBC
Iron: 23 ug/dL — ABNORMAL LOW (ref 28–170)
SATURATION RATIOS: 11 % (ref 10.4–31.8)
TIBC: 210 ug/dL — ABNORMAL LOW (ref 250–450)
UIBC: 187 ug/dL

## 2017-06-13 LAB — SEDIMENTATION RATE: SED RATE: 85 mm/h — AB (ref 0–22)

## 2017-06-13 LAB — MAGNESIUM: Magnesium: 1.7 mg/dL (ref 1.7–2.4)

## 2017-06-13 MED ORDER — PEG-KCL-NACL-NASULF-NA ASC-C 100 G PO SOLR
0.5000 | Freq: Once | ORAL | Status: AC
  Administered 2017-06-14: 100 g via ORAL

## 2017-06-13 MED ORDER — SODIUM CHLORIDE 0.9 % IV SOLN: Freq: Once | INTRAVENOUS | Status: DC

## 2017-06-13 MED ORDER — PEG-KCL-NACL-NASULF-NA ASC-C 100 G PO SOLR: 1.0000 | Freq: Once | ORAL | Status: DC

## 2017-06-13 MED ORDER — FUROSEMIDE 10 MG/ML IJ SOLN
40.0000 mg | Freq: Once | INTRAMUSCULAR | Status: AC
  Administered 2017-06-13: 40 mg via INTRAVENOUS
  Filled 2017-06-13: qty 4

## 2017-06-13 MED ORDER — PEG-KCL-NACL-NASULF-NA ASC-C 100 G PO SOLR
0.5000 | Freq: Once | ORAL | Status: AC
  Administered 2017-06-13: 100 g via ORAL
  Filled 2017-06-13: qty 1

## 2017-06-13 MED ORDER — PREMIER PROTEIN SHAKE
11.0000 [oz_av] | Freq: Two times a day (BID) | ORAL | Status: DC
  Administered 2017-06-15: 11 [oz_av] via ORAL
  Filled 2017-06-13 (×5): qty 325.31

## 2017-06-13 NOTE — Progress Notes (Addendum)
Patient ID: Shannon Schmidt, female   DOB: 14-May-1941, 76 y.o.   MRN: [id number]  PROGRESS NOTE    TRACEY HERMANCE  QPY:[id number] DOB: 1941/06/10 DOA: 06/08/2017 PCP: Theressa Millard, MD   Brief Narrative:  76 y.o female with history of COPD, diabetes mellitus type 2, chronic feet osteomyelitis, chronic bedridden status, chronic decubitus ulcers and recent history of pneumonia who is been on various antibiotics including cefepime and vancomycin for a week several weeks ago and more recently was on Levaquin from 06/04/2017 to be scheduled to 06/14/2017 along with prednisone taper started on 05/21/2017 for 20 days was sent from nursing home for evaluation of hemoglobin 6.3 and need for blood transfusion. She received 2 units of packed red cells. She was also found to have new right upper lobe infiltrate and right foot x-ray showed 1 destruction of the medial aspect of distal phalanx of the right great toe. She is on broad-spectrum antibiotics. MRI confirmed osteomyelitis. Orthopedics recommended medical management for now and outpatient followup.   Assessment & Plan:   Principal Problem:   Sepsis ([place]) Active Problems:   Diabetes mellitus ([place])   Decubitus ulcers   HTN (hypertension)   Anemia   HCAP (healthcare-associated pneumonia)   Osteomyelitis ([facility])   Oral thrush   Severe malnutrition ([facility])   Hypocalcemia    1. Symptomatic multifactorial anemia. SPblood transfusion, 2 units prbc.  - Hemoglobin is 6.6 today. Transfuse 1 unit of packed red cells and give 1 dose of Lasix after transfusion and check hemoglobin for tomorrow. Check stool for occult blood. Burnis Medin consult gastroenterology/Dr. Benson Norway for further input.  2. Right upper lobe pneumonia, present on admission - Currently on Zosyn and vancomycin. Cultures negative so far. Respiratory Status stable. Continue oxygen supplementation via nasal cannula. Finish 7 day of antibiotic treatment. -Aspiration precautions. Diet as per  SLP recommendations - Taper prednisone - Sputum culture had shown some rare yeast; currently on Diflucan as well  3. Right foot osteomyelitis. - Patient probably has chronic osteomyelitis of both feet because of her chronic bedridden status. It is unclear if the right foot osteomyelitis is acute on chronic. Currently on broad-spectrum antibiotics Zosyn and vancomycin - MRI of the right foot is showing early osteomyelitis of the right great toe. - Orthopedics recommends medical management and outpatient follow-up with Dr. Sharol Given - Spoke to Dr. Hatcher/infectious diseases and he'll see the patient in consultation  4. Multiple stage IV decubitus ulcers present on admission. Continue local wound care and follow recommendations from wound care nurse.  5. Type 2 diabetes mellitus. - Continue sliding scale insulin along with long-acting and short-acting insulin.  6. Hypertension. Continue with Furosemide and metoprolol succinate. Blood pressure on the higher side; we'll monitor  7. Depression. Continue risperidone and depakote. No agitation, continue neuro checks per unit protocol.   8. Bedridden status - Fall precautions.  9. Hypoalbuminemia - Nutrition consult  DVT prophylaxis:SCDs Code Status:DNR Family Communication:None at bedside Disposition Plan:Nursing home in 1-3 days   Consultants:  Orthopedics/ID. Awaiting callback from GI/Dr. Benson Norway  Procedures: None    Antimicrobials:   Vancomycin and Zosyn from 06/08/2017 onwards  Diflucan from 06/08/2017 onwards  Subjective: Patient seen and examined at bedside. She is awake and answers some questions but is a poor historian. No overnight vomiting, black or bloody stools or fever reported.  Objective: Vitals:   06/12/17 0759 06/12/17 2131 06/12/17 2207 06/13/17 0420  BP:  (!) 173/82  (!) 155/83  Pulse: 94 87  90  Resp: 18 19  17   Temp:  98.2 F (36.8 C)  98.1 F (36.7 C)  TempSrc:  Oral  Oral  SpO2:  (S) (!) 88% 100% 98% 100%  Weight:      Height:        Intake/Output Summary (Last 24 hours) at 06/13/17 0853 Last data filed at 06/13/17 0736  Gross per 24 hour  Intake              590 ml  Output             3000 ml  Net            -2410 ml   Filed Weights   06/08/17 1606 06/08/17 2216  Weight: 89.4 kg (197 lb) 93.7 kg (206 lb 9.1 oz)    Examination:  General exam: Appears calm and comfortable  Respiratory system: Bilateral decreased breath sound at bases With scattered crackles Cardiovascular system: S1 & S2 heard, rate controlled  Gastrointestinal system: Abdomen is nondistended, soft and nontender. Normal bowel sounds heard. Left sided colostomy present Extremities: No cyanosis, clubbing, bilateral lower extruding 1+ edema with right great toe showing dark description of the skin   Data Reviewed: I have personally reviewed following labs and imaging studies  CBC:  Recent Labs Lab 06/09/17 0359 06/09/17 1524 06/10/17 0500 06/11/17 0417 06/11/17 0900 06/13/17 0415  WBC 10.8* 15.0* 11.8* 10.9*  --  10.1  NEUTROABS 8.3* 13.9* 9.7* 8.4*  --  7.4  HGB 6.9* 8.1* 8.3* 7.4* 8.4* 6.6*  HCT 21.8* 25.8* 25.8* 23.7* 27.1* 21.0*  MCV 73.6* 73.5* 74.4* 75.7*  --  76.6*  PLT 356 375 406* 380  --  856   Basic Metabolic Panel:  Recent Labs Lab 06/08/17 1617 06/08/17 1629 06/09/17 0137 06/09/17 0359 06/10/17 0500 06/11/17 0417 06/12/17 0422 06/13/17 0415  NA 133* 131*  --  137 135 141  --  138  K 5.1 6.0*  --  3.8 3.9 4.2  --  4.1  CL 96* 94*  --  103 99* 105  --  98*  CO2 29  --   --  27 30 31   --  33*  GLUCOSE 83 88  --  90 180* 183*  --  213*  BUN 12 14  --  11 12 12   --  10  CREATININE 0.71 0.60  --  0.53 0.63 0.64 0.57 0.69  CALCIUM 7.5*  --   --  7.3* 7.6* 7.7*  --  7.9*  MG  --   --  2.1  --   --   --   --  1.7   GFR: Estimated Creatinine Clearance: 76.6 mL/min (by C-G formula based on SCr of 0.69 mg/dL). Liver Function Tests:  Recent Labs Lab  06/08/17 1617 06/13/17 0415  AST 42* 15  ALT 21 17  ALKPHOS 70 77  BILITOT 0.8 0.2*  PROT 7.2 5.9*  ALBUMIN 2.1* 1.8*   No results for input(s): LIPASE, AMYLASE in the last 168 hours. No results for input(s): AMMONIA in the last 168 hours. Coagulation Profile:  Recent Labs Lab 06/09/17 0137  INR 1.55   Cardiac Enzymes: No results for input(s): CKTOTAL, CKMB, CKMBINDEX, TROPONINI in the last 168 hours. BNP (last 3 results) No results for input(s): PROBNP in the last 8760 hours. HbA1C: No results for input(s): HGBA1C in the last 72 hours. CBG:  Recent Labs Lab 06/12/17 0715 06/12/17 1217 06/12/17 1725 06/12/17 2155 06/13/17 0740  GLUCAP 161* 229* 340*  257* 275*   Lipid Profile: No results for input(s): CHOL, HDL, LDLCALC, TRIG, CHOLHDL, LDLDIRECT in the last 72 hours. Thyroid Function Tests: No results for input(s): TSH, T4TOTAL, FREET4, T3FREE, THYROIDAB in the last 72 hours. Anemia Panel: No results for input(s): VITAMINB12, FOLATE, FERRITIN, TIBC, IRON, RETICCTPCT in the last 72 hours. Sepsis Labs:  Recent Labs Lab 06/08/17 1745 06/09/17 0137 06/09/17 0359 06/10/17 0500  PROCALCITON  --  2.11 1.80 6.97  LATICACIDVEN 1.33 1.2  --   --     Recent Results (from the past 240 hour(s))  Blood Culture (routine x 2)     Status: None (Preliminary result)   Collection Time: 06/08/17  4:17 PM  Result Value Ref Range Status   Specimen Description BLOOD  Final   Special Requests   Final    BOTTLES DRAWN AEROBIC AND ANAEROBIC Blood Culture adequate volume   Culture   Final    NO GROWTH 4 DAYS Performed at [facility], [address] [place], [place] [address]    Report Status PENDING  Incomplete  Blood Culture (routine x 2)     Status: None (Preliminary result)   Collection Time: 06/08/17  4:45 PM  Result Value Ref Range Status   Specimen Description BLOOD BLOOD RIGHT HAND  Final   Special Requests IN PEDIATRIC BOTTLE Blood Culture adequate volume   Final   Culture   Final    NO GROWTH 4 DAYS Performed at [facility], [place] [address], [place], [place] [address]    Report Status PENDING  Incomplete  Urine culture     Status: None   Collection Time: 06/08/17  6:23 PM  Result Value Ref Range Status   Specimen Description URINE, CATHETERIZED  Final   Special Requests Normal  Final   Culture   Final    NO GROWTH Performed at [facility], [place] [address] [place], [place] [address]    Report Status 06/10/2017 FINAL  Final  MRSA PCR Screening     Status: None   Collection Time: 06/08/17  9:36 PM  Result Value Ref Range Status   MRSA by PCR NEGATIVE NEGATIVE Final    Comment:        The GeneXpert MRSA Assay (FDA approved for NASAL specimens only), is one component of a comprehensive MRSA colonization surveillance program. It is not intended to diagnose MRSA infection nor to guide or monitor treatment for MRSA infections.   Culture, sputum-assessment     Status: None   Collection Time: 06/09/17  3:59 AM  Result Value Ref Range Status   Specimen Description SPU  Final   Special Requests Normal  Final   Sputum evaluation THIS SPECIMEN IS ACCEPTABLE FOR SPUTUM CULTURE  Final   Report Status 06/09/2017 FINAL  Final  Culture, respiratory (NON-Expectorated)     Status: None   Collection Time: 06/09/17  3:59 AM  Result Value Ref Range Status   Specimen Description SPU  Final   Special Requests Normal Reflexed from [id number]  Final   Gram Stain   Final    MODERATE WBC PRESENT,BOTH PMN AND MONONUCLEAR RARE SQUAMOUS EPITHELIAL CELLS PRESENT RARE YEAST Performed at [facility], [place] [address], [place], [place] [address]    Culture FEW CANDIDA ALBICANS  Final   Report Status 06/11/2017 FINAL  Final         Radiology Studies: Mr Foot Right W Wo Contrast  Result Date: 06/12/2017 CLINICAL DATA:  Chronic foot  infection. Patient gives a history of chronic osteomyelitis. Abnormal radiographs dated 06/08/2017.  EXAM: MRI OF THE RIGHT FOREFOOT WITHOUT AND WITH CONTRAST TECHNIQUE: Multiplanar, multisequence MR imaging of the right forefoot was performed before and after the administration of intravenous contrast. CONTRAST:  8mL MULTIHANCE GADOBENATE DIMEGLUMINE 529 MG/ML IV SOLN COMPARISON: None. FINDINGS: Bones/Joint/Cartilage There is abnormal enhancement and edema in the medial aspect of the head of the first metatarsal. There is very subtle abnormal edema and enhancement of the medial aspect of the tuft of the distal phalanx of the great toe. There is no cortical destruction at either site. There is also slight abnormal edema and enhancement of the plantar aspect of the head of the second metatarsal. There are no appreciable overlying soft tissue ulcers. No joint effusions.  The other bones of the forefoot appear normal. Soft tissues Prominent subcutaneous edema on the dorsum of the foot, nonspecific. The muscles appear normal. No abscesses. IMPRESSION: Abnormal edema and enhancement in the heads of the second and first metatarsals with a tiny area of abnormal edema and enhancement in the medial aspect of the tuft of the distal phalanx of the great toe. Findings are consistent with early osteomyelitis. Electronically Signed   By: Lorriane Shire M.D.   On: 06/12/2017 15:45        Scheduled Meds: . chlorhexidine  15 mL Mouth/Throat BID  . Chlorhexidine Gluconate Cloth  6 each Topical Daily  . collagenase   Topical Daily  . divalproex  250 mg Oral QHS  . enoxaparin (LOVENOX) injection  40 mg Subcutaneous Q24H  . feeding supplement (GLUCERNA SHAKE)  237 mL Oral BID BM  . feeding supplement (PRO-STAT SUGAR FREE 64)  30 mL Oral BID  . ferrous sulfate  325 mg Oral BID WC  . fluconazole  200 mg Oral Daily  . furosemide  40 mg Intravenous Once  . furosemide  20 mg Oral Daily  . gabapentin  300 mg Oral TID  . insulin aspart  0-15 Units Subcutaneous TID WC  . insulin detemir  10 Units Subcutaneous Daily   And   . insulin detemir  10 Units Subcutaneous QHS  . ipratropium-albuterol  3 mL Nebulization BID  . latanoprost  1 drop Both Eyes QHS  . loratadine  10 mg Oral q morning - 10a  . metoprolol succinate  25 mg Oral Daily  . oxycodone  30 mg Oral Q6H  . pantoprazole  40 mg Oral Daily  . predniSONE  10 mg Oral Q breakfast  . risperiDONE  1.5 mg Oral QHS  . simvastatin  10 mg Oral QHS  . tiZANidine  2 mg Oral BID   Continuous Infusions: . sodium chloride    . piperacillin-tazobactam (ZOSYN)  IV 3.375 g (06/13/17 0736)  . vancomycin Stopped (06/13/17 0716)     LOS: 5 days        Aline August, MD [facility] Pager [phone]  If 7PM-7AM, please contact night-coverage [email or web address] Password [facility] 06/13/2017, 8:53 AM

## 2017-06-13 NOTE — Progress Notes (Signed)
Hgb 6.6; paged Dr.

## 2017-06-13 NOTE — Consult Note (Signed)
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  Would continue vanco for 6 weeks Would continue zosyn for 7 days then change to cefepime to complete 6 weeks.  Improved Glc control to improve wound healing.  Glad to see her in f/u.    Thank you so much for this interesting consult,   Bobby Rumpf (pager) [phone] [email or web address]  Shannon Schmidt is an 76 y.o. female.  HPI: 76 yo F with DM, COPD, admon 7-12 with 3-4 weeks of pneumonia.  She was treated with cefepime/vanco followed by levaquin 7-8 to 7-18. She started a steroid taper 6-24 that lasted 20 days.  Despite this, she required O2 and more frequent nebs. She was producing yellow sputum. She had temp 101 at SNF and was also found to have HgB 6.3. Her CXR showed RUL pna.  She was started on vanco/zosyn to cover for aspiration. She was transfused 2 u PRBC.  Her O2 requirements decreased from 5Lpm to 2Lpm.  She underwent MRI of R foot showing osteo and no abscess. She was rec to have medical mgmt after ortho eval.  Today her HgB was noted to drop again (6.6). She has been eval for colon/endo on 7-18. She has a hx of polyps (TVA 2011).   HgB A1C 6.8% on 7-13.  ESR 85 CRP 8.2 BCx from adm (-)   Past Medical History:  Diagnosis Date  . Anemia   . Bipolar 1 disorder ([place])   . Colostomy in place [facility])   . COPD (chronic  obstructive pulmonary disease) ([place])   . Depression   . Diabetes mellitus   . GERD (gastroesophageal reflux disease)   . H/O hiatal hernia   . Muscle weakness   . Neuropathy   . Osteomyelitis ([place])   . Paraplegia ([place])   . PONV (postoperative nausea and vomiting)   . RA (rheumatoid arthritis) ([place])   . Schizophrenia [facility])     Past Surgical History:  Procedure Laterality Date  . ABDOMINAL HYSTERECTOMY    . BREAST SURGERY     L breast abscess  . CATARACT EXTRACTION W/PHACO  05/09/2012   Procedure: CATARACT EXTRACTION PHACO AND INTRAOCULAR LENS PLACEMENT (IOC);  Surgeon: Marylynn Pearson, MD;  Location: [place];  Service: Ophthalmology;  Laterality: Right;  . COLON SURGERY    . EYE SURGERY     L cataract  . GANGLION CYST EXCISION     R wrist x2  . HERNIA REPAIR     abdominal  . JOINT REPLACEMENT     knee  . KNEE SURGERY    . WOUND DEBRIDEMENT       Allergies  Allergen Reactions  . Ace Inhibitors Other (See Comments)    Unknown   . Chlorpromazine Other (See Comments)    Unknown  . Haloperidol Decanoate Other (See Comments)    Unknown    Medications:  Scheduled: . chlorhexidine  15 mL Mouth/Throat BID  . Chlorhexidine Gluconate Cloth  6  each Topical Daily  . collagenase   Topical Daily  . divalproex  250 mg Oral QHS  . enoxaparin (LOVENOX) injection  40 mg Subcutaneous Q24H  . feeding supplement (PRO-STAT SUGAR FREE 64)  30 mL Oral BID  . ferrous sulfate  325 mg Oral BID WC  . fluconazole  200 mg Oral Daily  . furosemide  20 mg Oral Daily  . gabapentin  300 mg Oral TID  . insulin aspart  0-15 Units Subcutaneous TID WC  . insulin detemir  10 Units Subcutaneous Daily   And  . insulin detemir  10 Units Subcutaneous QHS  . ipratropium-albuterol  3 mL Nebulization BID  . latanoprost  1 drop Both Eyes QHS  . loratadine  10 mg Oral q morning - 10a  . metoprolol succinate  25 mg Oral Daily  . oxycodone  30 mg Oral Q6H  . pantoprazole  40 mg Oral Daily  . peg 3350 powder   0.5 kit Oral Once   And  . [START ON 06/14/2017] peg 3350 powder  0.5 kit Oral Once  . predniSONE  10 mg Oral Q breakfast  . protein supplement shake  11 oz Oral BID BM  . risperiDONE  1.5 mg Oral QHS  . simvastatin  10 mg Oral QHS  . tiZANidine  2 mg Oral BID    Abtx:  Anti-infectives    Start     Dose/Rate Route Frequency Ordered Stop   06/09/17 0600  vancomycin (VANCOCIN) IVPB 1000 mg/200 mL premix     1,000 mg 200 mL/hr over 60 Minutes Intravenous Every 12 hours 06/08/17 2132     06/09/17 0500  ceFEPIme (MAXIPIME) 2 g in dextrose 5 % 50 mL IVPB  Status:  Discontinued     2 g 100 mL/hr over 30 Minutes Intravenous Every 12 hours 06/08/17 2132 06/08/17 2223   06/08/17 2359  piperacillin-tazobactam (ZOSYN) IVPB 3.375 g     3.375 g 12.5 mL/hr over 240 Minutes Intravenous Every 8 hours 06/08/17 2226     06/08/17 2230  fluconazole (DIFLUCAN) tablet 200 mg     200 mg Oral Daily 06/08/17 2223     06/08/17 2200  metroNIDAZOLE (FLAGYL) IVPB 500 mg  Status:  Discontinued     500 mg 100 mL/hr over 60 Minutes Intravenous Every 8 hours 06/08/17 2141 06/09/17 0820   06/08/17 1600  ceFEPIme (MAXIPIME) 2 g in dextrose 5 % 50 mL IVPB     2 g 100 mL/hr over 30 Minutes Intravenous  Once 06/08/17 1558 06/09/17 0824   06/08/17 1600  vancomycin (VANCOCIN) IVPB 1000 mg/200 mL premix     1,000 mg 200 mL/hr over 60 Minutes Intravenous  Once 06/08/17 1558 06/09/17 0825      Total days of antibiotics: 4 zosyn/vanco          Social History:  reports that she quit smoking about 12 years ago. She has a 80.00 pack-year smoking history. She has never used smokeless tobacco. She reports that she does not drink alcohol or use drugs.  Family History  Problem Relation Age of Onset  . Hypertension Mother   . Diabetes type II Mother   . Stroke Father   . Cancer Other     History obtained from chart review and the patient General ROS: continue cough. no change in vision. +neuropathy. hungry.  Please  see HPI. 12 point ROS o/w (-)  Blood pressure (!) 148/66, pulse 90, temperature 99.6 F (37.6 C), temperature source Oral,  resp. rate 19, height 5\' 10"  (1.778 m), weight 93.7 kg (206 lb 9.1 oz), SpO2 97 %. General appearance: alert, cooperative, no distress and slowed mentation Eyes: negative findings: pupils equal, round, reactive to light and accomodation Throat: normal findings: oropharynx pink & moist without lesions or evidence of thrush Neck: no adenopathy and supple, symmetrical, trachea midline Lungs: rhonchi anterior - bilateral, mild Heart: regular rate and rhythm Abdomen: normal findings: bowel sounds normal, soft, non-tender and ostomy site is clean.  Extremities: edema anasarca BLE Skin: onychomycosis. R great toe nail hanging off. large blood filled blister on sole of great toe. +neuropathy   Results for orders placed or performed during the hospital encounter of 06/08/17 (from the past 48 hour(s))  Glucose, capillary     Status: Abnormal   Collection Time: 06/11/17  5:29 PM  Result Value Ref Range   Glucose-Capillary 221 (H) 65 - 99 mg/dL   Comment 1 Notify RN    Comment 2 Document in Chart   Glucose, capillary     Status: Abnormal   Collection Time: 06/11/17  8:24 PM  Result Value Ref Range   Glucose-Capillary 241 (H) 65 - 99 mg/dL  Creatinine, serum     Status: None   Collection Time: 06/12/17  4:22 AM  Result Value Ref Range   Creatinine, Ser 0.57 0.44 - 1.00 mg/dL   GFR calc non Af Amer >60 >60 mL/min   GFR calc Af Amer >60 >60 mL/min    Comment: (NOTE) The eGFR has been calculated using the CKD EPI equation. This calculation has not been validated in all clinical situations. eGFR's persistently <60 mL/min signify possible Chronic Kidney Disease.   Glucose, capillary     Status: Abnormal   Collection Time: 06/12/17  7:15 AM  Result Value Ref Range   Glucose-Capillary 161 (H) 65 - 99 mg/dL   Comment 1 Notify RN    Comment 2 Document in Chart   Glucose,  capillary     Status: Abnormal   Collection Time: 06/12/17 12:17 PM  Result Value Ref Range   Glucose-Capillary 229 (H) 65 - 99 mg/dL   Comment 1 Notify RN    Comment 2 Document in Chart   Glucose, capillary     Status: Abnormal   Collection Time: 06/12/17  5:25 PM  Result Value Ref Range   Glucose-Capillary 340 (H) 65 - 99 mg/dL   Comment 1 Notify RN    Comment 2 Document in Chart   Glucose, capillary     Status: Abnormal   Collection Time: 06/12/17  9:55 PM  Result Value Ref Range   Glucose-Capillary 257 (H) 65 - 99 mg/dL  CBC with Differential/Platelet     Status: Abnormal   Collection Time: 06/13/17  4:15 AM  Result Value Ref Range   WBC 10.1 4.0 - 10.5 K/uL   RBC 2.74 (L) 3.87 - 5.11 MIL/uL   Hemoglobin 6.6 (LL) 12.0 - 15.0 g/dL    Comment: REPEATED TO VERIFY CRITICAL RESULT CALLED TO, READ BACK BY AND VERIFIED WITH: MAGGIE VAUGHN,RN 06/15/17 @ 0459 BY J SCOTTON    HCT 21.0 (L) 36.0 - 46.0 %   MCV 76.6 (L) 78.0 - 100.0 fL   MCH 24.1 (L) 26.0 - 34.0 pg   MCHC 31.4 30.0 - 36.0 g/dL   RDW 559860 (H) 90.1 - 69.8 %   Platelets 348 150 - 400 K/uL   Neutrophils Relative % 73 %   Lymphocytes Relative 19 %   Monocytes Relative  7 %   Eosinophils Relative 1 %   Basophils Relative 0 %   Neutro Abs 7.4 1.7 - 7.7 K/uL   Lymphs Abs 1.9 0.7 - 4.0 K/uL   Monocytes Absolute 0.7 0.1 - 1.0 K/uL   Eosinophils Absolute 0.1 0.0 - 0.7 K/uL   Basophils Absolute 0.0 0.0 - 0.1 K/uL   RBC Morphology TARGET CELLS     Comment: POLYCHROMASIA PRESENT  Comprehensive metabolic panel     Status: Abnormal   Collection Time: 06/13/17  4:15 AM  Result Value Ref Range   Sodium 138 135 - 145 mmol/L   Potassium 4.1 3.5 - 5.1 mmol/L   Chloride 98 (L) 101 - 111 mmol/L   CO2 33 (H) 22 - 32 mmol/L   Glucose, Bld 213 (H) 65 - 99 mg/dL   BUN 10 6 - 20 mg/dL   Creatinine, Ser 0.69 0.44 - 1.00 mg/dL   Calcium 7.9 (L) 8.9 - 10.3 mg/dL   Total Protein 5.9 (L) 6.5 - 8.1 g/dL   Albumin 1.8 (L) 3.5 - 5.0 g/dL    AST 15 15 - 41 U/L   ALT 17 14 - 54 U/L   Alkaline Phosphatase 77 38 - 126 U/L   Total Bilirubin 0.2 (L) 0.3 - 1.2 mg/dL   GFR calc non Af Amer >60 >60 mL/min   GFR calc Af Amer >60 >60 mL/min    Comment: (NOTE) The eGFR has been calculated using the CKD EPI equation. This calculation has not been validated in all clinical situations. eGFR's persistently <60 mL/min signify possible Chronic Kidney Disease.    Anion gap 7 5 - 15  Magnesium     Status: None   Collection Time: 06/13/17  4:15 AM  Result Value Ref Range   Magnesium 1.7 1.7 - 2.4 mg/dL  Sedimentation rate     Status: Abnormal   Collection Time: 06/13/17  4:15 AM  Result Value Ref Range   Sed Rate 85 (H) 0 - 22 mm/hr  C-reactive protein     Status: Abnormal   Collection Time: 06/13/17  4:15 AM  Result Value Ref Range   CRP 8.2 (H) <1.0 mg/dL    Comment: Performed at [facility] [address] [place], [place] [address]  Glucose, capillary     Status: Abnormal   Collection Time: 06/13/17  7:40 AM  Result Value Ref Range   Glucose-Capillary 275 (H) 65 - 99 mg/dL  Prepare RBC     Status: None   Collection Time: 06/13/17 11:21 AM  Result Value Ref Range   Order Confirmation ORDER PROCESSED BY BLOOD BANK   Iron and TIBC     Status: Abnormal   Collection Time: 06/13/17 11:21 AM  Result Value Ref Range   Iron 23 (L) 28 - 170 ug/dL   TIBC 210 (L) 250 - 450 ug/dL   Saturation Ratios 11 10.4 - 31.8 %   UIBC 187 ug/dL    Comment: Performed at [facility], [place]. [address], [place], [place] [address]  Type and screen [place]     Status: None (Preliminary result)   Collection Time: 06/13/17 11:21 AM  Result Value Ref Range   ABO/RH(D) O POS    Antibody Screen NEG    Sample Expiration 06/16/2017    Unit Number [id number]    Blood Component Type RED CELLS,LR    Unit division 00    Status of Unit ISSUED    Transfusion Status OK  TO TRANSFUSE    Crossmatch Result Compatible   Occult  blood card to lab, stool RN will collect     Status: None   Collection Time: 06/13/17 11:41 AM  Result Value Ref Range   Fecal Occult Bld NEGATIVE NEGATIVE  Glucose, capillary     Status: Abnormal   Collection Time: 06/13/17 11:43 AM  Result Value Ref Range   Glucose-Capillary 322 (H) 65 - 99 mg/dL      Component Value Date/Time   SDES SPU 06/09/2017 0359   SDES SPU 06/09/2017 0359   SPECREQUEST Normal 06/09/2017 0359   SPECREQUEST Normal Reflexed from [id number] 06/09/2017 0359   CULT FEW CANDIDA ALBICANS 06/09/2017 0359   REPTSTATUS 06/09/2017 FINAL 06/09/2017 0359   REPTSTATUS 06/11/2017 FINAL 06/09/2017 0359   Mr Foot Right W Wo Contrast  Result Date: 06/12/2017 CLINICAL DATA:  Chronic foot infection. Patient gives a history of chronic osteomyelitis. Abnormal radiographs dated 06/08/2017. EXAM: MRI OF THE RIGHT FOREFOOT WITHOUT AND WITH CONTRAST TECHNIQUE: Multiplanar, multisequence MR imaging of the right forefoot was performed before and after the administration of intravenous contrast. CONTRAST:  84m MULTIHANCE GADOBENATE DIMEGLUMINE 529 MG/ML IV SOLN COMPARISON: None. FINDINGS: Bones/Joint/Cartilage There is abnormal enhancement and edema in the medial aspect of the head of the first metatarsal. There is very subtle abnormal edema and enhancement of the medial aspect of the tuft of the distal phalanx of the great toe. There is no cortical destruction at either site. There is also slight abnormal edema and enhancement of the plantar aspect of the head of the second metatarsal. There are no appreciable overlying soft tissue ulcers. No joint effusions.  The other bones of the forefoot appear normal. Soft tissues Prominent subcutaneous edema on the dorsum of the foot, nonspecific. The muscles appear normal. No abscesses. IMPRESSION: Abnormal edema and enhancement in the heads of the second and first metatarsals with a tiny area of abnormal edema and enhancement in the medial aspect of the tuft  of the distal phalanx of the great toe. Findings are consistent with early osteomyelitis. Electronically Signed   By: JLorriane ShireM.D.   On: 06/12/2017 15:45   Recent Results (from the past 240 hour(s))  Blood Culture (routine x 2)     Status: None (Preliminary result)   Collection Time: 06/08/17  4:17 PM  Result Value Ref Range Status   Specimen Description BLOOD  Final   Special Requests   Final    BOTTLES DRAWN AEROBIC AND ANAEROBIC Blood Culture adequate volume   Culture   Final    NO GROWTH 4 DAYS Performed at [facility] [address] [place] [address]   Report Status PENDING  Incomplete  Blood Culture (routine x 2)     Status: None (Preliminary result)   Collection Time: 06/08/17  4:45 PM  Result Value Ref Range Status   Specimen Description BLOOD BLOOD RIGHT HAND  Final   Special Requests IN PEDIATRIC BOTTLE Blood Culture adequate volume  Final   Culture   Final    NO GROWTH 4 DAYS Performed at [facility] [address], [place] [address]   Report Status PENDING  Incomplete  Urine culture     Status: None   Collection Time: 06/08/17  6:23 PM  Result Value Ref Range Status   Specimen Description URINE, CATHETERIZED  Final   Special Requests Normal  Final   Culture   Final    NO GROWTH Performed  at [facility], [place] [address], [place], [place] [address]    Report Status 06/10/2017 FINAL  Final  MRSA PCR Screening     Status: None   Collection Time: 06/08/17  9:36 PM  Result Value Ref Range Status   MRSA by PCR NEGATIVE NEGATIVE Final    Comment:        The GeneXpert MRSA Assay (FDA approved for NASAL specimens only), is one component of a comprehensive MRSA colonization surveillance program. It is not intended to diagnose MRSA infection nor to guide or monitor treatment for MRSA infections.   Culture, sputum-assessment     Status: None   Collection Time: 06/09/17  3:59 AM  Result Value Ref Range Status    Specimen Description SPU  Final   Special Requests Normal  Final   Sputum evaluation THIS SPECIMEN IS ACCEPTABLE FOR SPUTUM CULTURE  Final   Report Status 06/09/2017 FINAL  Final  Culture, respiratory (NON-Expectorated)     Status: None   Collection Time: 06/09/17  3:59 AM  Result Value Ref Range Status   Specimen Description SPU  Final   Special Requests Normal Reflexed from [id number]  Final   Gram Stain   Final    MODERATE WBC PRESENT,BOTH PMN AND MONONUCLEAR RARE SQUAMOUS EPITHELIAL CELLS PRESENT RARE YEAST Performed at [facility], [place] [address], [place], [place] [address]    Culture FEW CANDIDA ALBICANS  Final   Report Status 06/11/2017 FINAL  Final      06/13/2017, 2:32 PM     LOS: 5 days    Records and images were personally reviewed where available.

## 2017-06-13 NOTE — Consult Note (Signed)
Referring Provider: Dr. Starla Link Primary Care Physician:  Theressa Millard, MD Primary Gastroenterologist:  Althia Forts  Reason for Consultation:  Microcytic anemia  HPI: Shannon Schmidt is a 76 y.o. female with history of COPD, diabetes mellitus type 2, chronic feet osteomyelitis, chronic bedridden status, chronic decubitus ulcers and recent history of pneumonia.  Had diverting colostomy in about 2007 because of the decubitus ulcers.  She was sent from nursing home for evaluation of hemoglobin 6.3 grams and need for blood transfusion. She received 2 units of packed red cells. She was also found to have new right upper lobe infiltrate and right foot x-ray showed 1 destruction of the medial aspect of distal phalanx of the right great toe. She is on broad-spectrum antibiotics. MRI confirmed osteomyelitis. Orthopedics recommended medical management for now and outpatient followup.  ID going to see.    Hgb responded well with increase to 8 gram range, but today was down to 6.6 grams again.  Going to receive another unit PRBC's.  No overt GI bleeding and has been heme negative x 2.  Patient denies abdominal pain.  Not aware of any issues with the ostomy or bleeding.  Says that she eats well and denies weight loss to her knowledge.  On omeprazole 20 mg BID at home and pantoprazole 40 mg daily here.  Had been on prednisone recently (guessing for pulmonary issues).  Colonoscopy 04/2010 by Dr. Benson Norway while inpatient showed a 1 cm friable polyp in the transverse colon that was removed and was a TVA on pathology.  Also had some other small polyps that were not removed due to patient being on anticoagulation at that time.  2011 colonoscopy was also performed for IDA and hemoccult positive stool.   Past Medical History:  Diagnosis Date  . Anemia   . Bipolar 1 disorder ([place])   . Colostomy in place [facility])   . COPD (chronic obstructive pulmonary disease) ([place])   . Depression   . Diabetes mellitus   . GERD  (gastroesophageal reflux disease)   . H/O hiatal hernia   . Muscle weakness   . Neuropathy   . Osteomyelitis ([place])   . Paraplegia ([place])   . PONV (postoperative nausea and vomiting)   . RA (rheumatoid arthritis) ([place])   . Schizophrenia [facility])     Past Surgical History:  Procedure Laterality Date  . ABDOMINAL HYSTERECTOMY    . BREAST SURGERY     L breast abscess  . CATARACT EXTRACTION W/PHACO  05/09/2012   Procedure: CATARACT EXTRACTION PHACO AND INTRAOCULAR LENS PLACEMENT (IOC);  Surgeon: Marylynn Pearson, MD;  Location: [facility];  Service: Ophthalmology;  Laterality: Right;  . COLON SURGERY    . EYE SURGERY     L cataract  . GANGLION CYST EXCISION     R wrist x2  . HERNIA REPAIR     abdominal  . JOINT REPLACEMENT     knee  . KNEE SURGERY    . WOUND DEBRIDEMENT      Prior to Admission medications   Medication Sig Start Date End Date Taking? Authorizing Provider  acetaminophen (MAPAP) 325 MG tablet Take 650 mg by mouth every 8 (eight) hours as needed for mild pain or fever. For pain or fever.    Yes [provider]  albuterol (PROVENTIL) (2.5 MG/3ML) 0.083% nebulizer solution Take 2.5 mg by nebulization every 6 (six) hours as needed for wheezing or shortness of breath.   Yes [provider]  divalproex (DEPAKOTE ER) 250 MG  24 hr tablet Take 250 mg by mouth at bedtime.   Yes [provider]  ferrous sulfate 325 (65 FE) MG tablet Take 325 mg by mouth 2 (two) times daily with a meal.   Yes [provider]  furosemide (LASIX) 20 MG tablet Take 20 mg by mouth daily.   Yes [provider]  gabapentin (NEURONTIN) 300 MG capsule Take 300 mg by mouth 3 (three) times daily.   Yes [provider]  insulin detemir (LEVEMIR) 100 UNIT/ML injection Inject 26-55 Units into the skin 2 (two) times daily. Inject 24units at 9a and 55 units at 5pm   Yes [provider]  insulin lispro (HUMALOG) 100 UNIT/ML injection Inject 0-10 Units into the  skin 2 (two) times daily. 61-150 0 units 151-200 2 units 201-250 4 units 251-300 6 units 301-350 8 units 351-400 10 units   Yes [provider]  ipratropium-albuterol (DUONEB) 0.5-2.5 (3) MG/3ML SOLN Take 3 mLs by nebulization 4 (four) times daily.   Yes [provider]  latanoprost (XALATAN) 0.005 % ophthalmic solution Place 1 drop into both eyes at bedtime.   Yes [provider]  levofloxacin (LEVAQUIN) 750 MG tablet Take 750 mg by mouth daily. Started 06/02/17.  Originally ordered x7days, but then changed until pneumonia resolves.   Yes [provider]  loratadine (CLARITIN) 10 MG tablet Take 10 mg by mouth every morning.    Yes [provider]  magnesium hydroxide (MILK OF MAGNESIA) 400 MG/5ML suspension Take 30 mLs by mouth daily as needed for mild constipation.   Yes [provider]  metFORMIN (GLUCOPHAGE) 500 MG tablet Take 500 mg by mouth daily with breakfast.   Yes [provider]  metoprolol succinate (TOPROL-XL) 25 MG 24 hr tablet Take 25 mg by mouth daily.   Yes [provider]  Multiple Vitamin (MULTIVITAMIN WITH MINERALS) TABS tablet Take 1 tablet by mouth daily.   Yes [provider]  omeprazole (PRILOSEC) 20 MG capsule Take 20 mg by mouth 2 (two) times daily before a meal. For heartburn.   Yes [provider]  oxycodone (ROXICODONE) 30 MG immediate release tablet Take 30 mg by mouth every 6 (six) hours.   Yes [provider]  predniSONE (DELTASONE) 20 MG tablet Take 20 mg by mouth daily with breakfast. Ordered for 20 days to complete on 7/16 then decrease to 10mg  for 1 day   Yes [provider]  risperiDONE (RISPERDAL) 1 MG tablet Take 1.5 mg by mouth at bedtime.    Yes [provider]  simethicone (MI-ACID GAS RELIEF) 80 MG chewable tablet Chew 80 mg by mouth every 12 (twelve) hours as needed. For gas.   Yes [provider]  simvastatin (ZOCOR) 10 MG tablet  Take 10 mg by mouth at bedtime.   Yes [provider]  sodium hypochlorite (DAKIN'S 1/4 STRENGTH) 0.125 % SOLN Irrigate with 1 application as directed every evening. Apply to left sacrum at dressing changes nightly   Yes [provider]  tiZANidine (ZANAFLEX) 2 MG tablet Take 2 mg by mouth 2 (two) times daily.   Yes [provider]  Vitamin D, Ergocalciferol, (DRISDOL) 50000 units CAPS capsule Take 50,000 Units by mouth every 30 (thirty) days.   Yes [provider]  diphenhydrAMINE (BENADRYL) 25 mg capsule Take 25 mg by mouth every 8 (eight) hours as needed for itching. For itching and/or swelling.    [provider]    Current Facility-Administered Medications  Medication  Dose Route Frequency Provider Last Rate Last Dose  . 0.9 %  sodium chloride infusion   Intravenous Once Rise Patience, MD   Stopped at 06/13/17 [phone]  . acetaminophen (TYLENOL) tablet 650 mg  650 mg Oral Q6H PRN Tawni Millers, MD   650 mg at 06/12/17 2235  . albuterol (PROVENTIL) (2.5 MG/3ML) 0.083% nebulizer solution 2.5 mg  2.5 mg Nebulization Q6H PRN Karmen Bongo, MD      . chlorhexidine (PERIDEX) 0.12 % solution 15 mL  15 mL Mouth/Throat BID Karmen Bongo, MD   15 mL at 06/12/17 2219  . Chlorhexidine Gluconate Cloth 2 % PADS 6 each  6 each Topical Daily Karmen Bongo, MD   6 each at 06/10/17 1510  . collagenase (SANTYL) ointment   Topical Daily Arrien, Jimmy Picket, MD      . diphenhydrAMINE (BENADRYL) capsule 25 mg  25 mg Oral Q8H PRN Karmen Bongo, MD      . divalproex (DEPAKOTE ER) 24 hr tablet 250 mg  250 mg Oral Ivery Quale, MD   250 mg at 06/12/17 2219  . enoxaparin (LOVENOX) injection 40 mg  40 mg Subcutaneous Q24H Karmen Bongo, MD   40 mg at 06/12/17 2220  . feeding supplement (GLUCERNA SHAKE) (GLUCERNA SHAKE) liquid 237 mL  237 mL Oral BID BM Arrien, Jimmy Picket, MD   237 mL at 06/12/17 1400  . feeding supplement (PRO-STAT SUGAR  FREE 64) liquid 30 mL  30 mL Oral BID Arrien, Jimmy Picket, MD   30 mL at 06/12/17 1028  . ferrous sulfate tablet 325 mg  325 mg Oral BID WC Karmen Bongo, MD   325 mg at 06/13/17 0737  . fluconazole (DIFLUCAN) tablet 200 mg  200 mg Oral Daily Karmen Bongo, MD   200 mg at 06/12/17 1026  . furosemide (LASIX) injection 40 mg  40 mg Intravenous Once Alekh, Kshitiz, MD      . furosemide (LASIX) tablet 20 mg  20 mg Oral Daily Karmen Bongo, MD   20 mg at 06/12/17 1026  . gabapentin (NEURONTIN) capsule 300 mg  300 mg Oral TID Karmen Bongo, MD   300 mg at 06/12/17 2221  . insulin aspart (novoLOG) injection 0-15 Units  0-15 Units Subcutaneous TID WC Karmen Bongo, MD   8 Units at 06/13/17 0740  . insulin detemir (LEVEMIR) injection 10 Units  10 Units Subcutaneous Daily Arrien, Jimmy Picket, MD   10 Units at 06/12/17 1029   And  . insulin detemir (LEVEMIR) injection 10 Units  10 Units Subcutaneous QHS Tawni Millers, MD   10 Units at 06/12/17 2234  . ipratropium-albuterol (DUONEB) 0.5-2.5 (3) MG/3ML nebulizer solution 3 mL  3 mL Nebulization BID Arrien, Jimmy Picket, MD   3 mL at 06/13/17 0919  . latanoprost (XALATAN) 0.005 % ophthalmic solution 1 drop  1 drop Both Eyes QHS Karmen Bongo, MD   1 drop at 06/12/17 2340  . loratadine (CLARITIN) tablet 10 mg  10 mg Oral q morning - 10a Karmen Bongo, MD   10 mg at 06/12/17 1026  . magnesium hydroxide (MILK OF MAGNESIA) suspension 30 mL  30 mL Oral Daily PRN Karmen Bongo, MD      . metoprolol succinate (TOPROL-XL) 24 hr tablet 25 mg  25 mg Oral Daily Karmen Bongo, MD   25 mg at 06/12/17 1026  . oxyCODONE (Oxy IR/ROXICODONE) immediate release tablet 30 mg  30 mg Oral Q6H Karmen Bongo, MD   30 mg at 06/12/17  2222  . pantoprazole (PROTONIX) EC tablet 40 mg  40 mg Oral Daily Karmen Bongo, MD   40 mg at 06/12/17 1029  . piperacillin-tazobactam (ZOSYN) IVPB 3.375 g  3.375 g Intravenous Q8H Dorrene German, RPH 12.5 mL/hr  at 06/13/17 0736 3.375 g at 06/13/17 0736  . predniSONE (DELTASONE) tablet 10 mg  10 mg Oral Q breakfast Aline August, MD   10 mg at 06/13/17 0736  . risperiDONE (RISPERDAL) tablet 1.5 mg  1.5 mg Oral QHS Karmen Bongo, MD   1.5 mg at 06/12/17 2224  . simvastatin (ZOCOR) tablet 10 mg  10 mg Oral Ivery Quale, MD   10 mg at 06/12/17 2225  . sodium chloride flush (NS) 0.9 % injection 10-40 mL  10-40 mL Intracatheter PRN Karmen Bongo, MD      . sodium chloride flush (NS) 0.9 % injection 10-40 mL  10-40 mL Intracatheter PRN Aline August, MD   10 mL at 06/13/17 0737  . tiZANidine (ZANAFLEX) tablet 2 mg  2 mg Oral BID Karmen Bongo, MD   2 mg at 06/12/17 2225  . vancomycin (VANCOCIN) IVPB 1000 mg/200 mL premix  1,000 mg Intravenous Q12H Thomes Lolling, [facility]   Stopped at 06/13/17 1610    Allergies as of 06/08/2017 - Review Complete 06/08/2017  Allergen Reaction Noted  . Ace inhibitors Other (See Comments) 02/06/2012  . Chlorpromazine Other (See Comments) 02/06/2012  . Haloperidol decanoate Other (See Comments) 02/06/2012    Family History  Problem Relation Age of Onset  . Hypertension Mother   . Diabetes type II Mother   . Stroke Father   . Cancer Other     Social History   Social History  . Marital status: Divorced    Spouse name: N/A  . Number of children: N/A  . Years of education: N/A   Occupational History  . retired    Social History Main Topics  . Smoking status: Former Smoker    Packs/day: 2.00    Years: 40.00    Quit date: 2006  . Smokeless tobacco: Never Used  . Alcohol use No  . Drug use: No  . Sexual activity: Not on file   Other Topics Concern  . Not on file   Social History Narrative  . No narrative on file    Review of Systems: ROS are O/W negative.  Physical Exam: Vital signs in last 24 hours: Temp:  [98.1 F (36.7 C)-98.2 F (36.8 C)] 98.1 F (36.7 C) (07/17 0420) Pulse Rate:  [87-90] 90 (07/17 0420) Resp:  [17-19] 17  (07/17 0420) BP: (155-173)/(82-83) 155/83 (07/17 0420) SpO2:  [90 %-100 %] 90 % (07/17 0919) Last BM Date: 06/13/17 General:  Alert, Well-developed, well-nourished, pleasant and cooperative in NAD Head:  Normocephalic and atraumatic. Eyes:  Sclera clear, no icterus.  Conjunctiva pink. Ears:  Normal auditory acuity. Mouth:  No deformity or lesions.   Neck:  Supple; no masses or thyromegaly. Lungs:  Course BS on the right anteriorly. Heart:  Regular rate and rhythm; no murmurs, clicks, rubs, or gallops. Abdomen:  Soft, non-distended.  BS present.  Non-tender.  Ostomy noted on the left abdomen. Rectal:  Deferred.  Heme negative x 2.  Msk:  Symmetrical without gross deformities. Pulses:  Normal pulses noted. Extremities:  2+ pitting edema in B/L LE's. Neurologic:  Alert and oriented x 4;  grossly normal neurologically. Psych:  Alert and cooperative. Normal mood and affect.  Intake/Output from previous day: 07/16 0701 - 07/17 0700  In: 350 [P.O.:350] Out: 3000 [Urine:3000] Intake/Output this shift: Total I/O In: 240 [P.O.:240] Out: 1200 [Urine:1200]  Lab Results:  Recent Labs  06/11/17 0417 06/11/17 0900 06/13/17 0415  WBC 10.9*  --  10.1  HGB 7.4* 8.4* 6.6*  HCT 23.7* 27.1* 21.0*  PLT 380  --  348   BMET  Recent Labs  06/11/17 0417 06/12/17 0422 06/13/17 0415  NA 141  --  138  K 4.2  --  4.1  CL 105  --  98*  CO2 31  --  33*  GLUCOSE 183*  --  213*  BUN 12  --  10  CREATININE 0.64 0.57 0.69  CALCIUM 7.7*  --  7.9*   LFT  Recent Labs  06/13/17 0415  PROT 5.9*  ALBUMIN 1.8*  AST 15  ALT 17  ALKPHOS 77  BILITOT 0.2*   Studies/Results: Mr Foot Right W Wo Contrast  Result Date: 06/12/2017 CLINICAL DATA:  Chronic foot infection. Patient gives a history of chronic osteomyelitis. Abnormal radiographs dated 06/08/2017. EXAM: MRI OF THE RIGHT FOREFOOT WITHOUT AND WITH CONTRAST TECHNIQUE: Multiplanar, multisequence MR imaging of the right forefoot was performed  before and after the administration of intravenous contrast. CONTRAST:  78mL MULTIHANCE GADOBENATE DIMEGLUMINE 529 MG/ML IV SOLN COMPARISON: None. FINDINGS: Bones/Joint/Cartilage There is abnormal enhancement and edema in the medial aspect of the head of the first metatarsal. There is very subtle abnormal edema and enhancement of the medial aspect of the tuft of the distal phalanx of the great toe. There is no cortical destruction at either site. There is also slight abnormal edema and enhancement of the plantar aspect of the head of the second metatarsal. There are no appreciable overlying soft tissue ulcers. No joint effusions.  The other bones of the forefoot appear normal. Soft tissues Prominent subcutaneous edema on the dorsum of the foot, nonspecific. The muscles appear normal. No abscesses. IMPRESSION: Abnormal edema and enhancement in the heads of the second and first metatarsals with a tiny area of abnormal edema and enhancement in the medial aspect of the tuft of the distal phalanx of the great toe. Findings are consistent with early osteomyelitis. Electronically Signed   By: Lorriane Shire M.D.   On: 06/12/2017 15:45   IMPRESSION:  -Severe microcytic anemia:  Hgb 6.8 grams on admission, transfused with 2 units PRBC's and initially good response, but now Hgb back down to 6.6 grams today (2 gram drop from 2 days ago). Hemoccult negative on admission and again today.  No sign of overt bleeding.  Ferritin normal.  Other iron studies have not been checked.  Going to receive another unit PRBC's.  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 -Personal history of colon polyps:  1 cm TVA in 2011. -RUL PNA:  On abx and Diflucan (yeast present on sputum culture).  Respiratory status stable. -Multiple stage IV decubitus ulcers -Right foot osteomyelitis:  ID seeing patient and ortho recommended outpatient follow-up. -Type 2 DM:  On insulin.   -S/p diverting colostomy due to decubitus ulcers in about 2007. -Severe  protein-calorie malnutrition:  Pre-albumin <5.  PLAN: -Will check iron studies and if very low then may benefit from iron infusion while inpatient. -Will check a haptoglobin. -I had a long discussion with the patient regarding evaluation of this type of issue from a GI standpoint.  She understands that EGD and colonoscopy will require her to be put to sleep and that she will have to drink bowel prep.  She is agreeable.  Will place her on clear liquids for the remainder of the day and plan to prep for possible colonoscopy and EGD on 7/18.  Keri Veale D.  06/13/2017, 10:08 AM  Pager number [phone]

## 2017-06-13 NOTE — Progress Notes (Signed)
DC plan continues to be for SNF. CSW following. CM will assist as needed. Marney Doctor RN,BSN,NCM [phone]

## 2017-06-13 NOTE — Progress Notes (Signed)
Nutrition Follow-up  DOCUMENTATION CODES:   Obesity unspecified  INTERVENTION:   -D/c Glucerna shakes -Provide Premier Protein BID, each supplement provides 160kcal and 30g protein.  -Continue Prostat liquid protein PO 30 ml BID with meals, each supplement provides 100 kcal, 15 grams protein. -RD will monitor for additional needs  NUTRITION DIAGNOSIS:   Increased nutrient needs related to wound healing as evidenced by estimated needs.  Ongoing.  GOAL:   Patient will meet greater than or equal to 90% of their needs  Progressing.  MONITOR:   PO intake, Supplement acceptance, Labs, Weight trends, Skin, I & O's  REASON FOR ASSESSMENT:   Consult Assessment of nutrition requirement/status  ASSESSMENT:   76 y.o. Female with medical history significant of COPD, DM, and osteomyelitis presenting from SNF with persistent pneumonia x 3-4 weeks.  Patient initially assessed on 7/13. When visited patient today, pt was eating lunch meal of chicken tenders, collard greens, and ice cream. Pt states she is eating much better. PO intakes over the past 48 hours have been 75-100%. Pt states she eats "anything they give me". She is drinking the Glucerna shakes and taking the Prostat about 50% of the time it is offered. Will change Glucerna to Premier Protein as this will provide more protein with similar volumes.   Nutrition-Focused physical exam completed. Findings are no fat depletion, mild/moderate muscle depletion, and no edema.  Patient does not meet criteria for malnutrition at this time.   Medications: Ferrous sulfate tablet BID, IV Lasix daily, Protonix tablet daily Labs reviewed: CBGs: 275-322  Diet Order:  Diet Carb Modified Fluid consistency: Thin; Room service appropriate? Yes  Skin:   Pressure injuries: Stage IV sacrum, Stage III buttocks. Non-pressure wounds on bilateral feet  Last BM:  7/17  Height:   Ht Readings from Last 1 Encounters:  06/08/17 5\' 10"  (1.778 m)     Weight:   Wt Readings from Last 1 Encounters:  06/08/17 206 lb 9.1 oz (93.7 kg)    Ideal Body Weight:  68.1 kg  BMI:  Body mass index is 29.64 kg/m.  Estimated Nutritional Needs:   Kcal:  1800-2000  Protein:  100-115 gm  Fluid:  1.8-2.0 L  EDUCATION NEEDS:   No education needs identified at this time  Clayton Bibles, MS, RD, LDN Pager: [phone] After Hours Pager: [phone]

## 2017-06-13 NOTE — NC FL2 (Signed)
[place] LEVEL OF CARE SCREENING TOOL     IDENTIFICATION  Patient Name: Shannon Schmidt Birthdate: [birth year] Sex: female Admission Date (Current Location): 06/08/2017  [place] and [place] Number:  Shannon Schmidt [id number] [place] and Address:  [facility],  [place] [address], [place]      Provider Number: [id number]  Attending Physician Name and Address:  Aline August, MD  Relative Name and Phone Number:       Current Level of Care: Hospital Recommended Level of Care: [place] Prior Approval Number:    Date Approved/Denied:   PASRR Number:   [id number]   Discharge Plan: SNF    Current Diagnoses: Patient Active Problem List   Diagnosis Date Noted  . Sepsis ([place]) 06/08/2017  . HCAP (healthcare-associated pneumonia) 06/08/2017  . Osteomyelitis ([place]) 06/08/2017  . Oral thrush 06/08/2017  . Severe malnutrition ([place]) 06/08/2017  . Hypocalcemia 06/08/2017  . HTN (hypertension) 03/10/2014  . Leukocytosis, unspecified 03/10/2014  . Anemia 03/10/2014  . Hyponatremia 03/10/2014  . Dyslipidemia 03/10/2014  . Pyelonephritis due to Escherichia coli 03/09/2014  . Ileus ([place]) 03/09/2014  . Diabetes mellitus ([place]) 03/09/2014  . Decubitus ulcers 03/09/2014    Orientation RESPIRATION BLADDER Height & Weight     Self, Time, Situation, Place  O2 (2L) Incontinent, External catheter Weight: 206 lb 9.1 oz (93.7 kg) Height:  5\' 10"  (177.8 cm)  BEHAVIORAL SYMPTOMS/MOOD NEUROLOGICAL BOWEL NUTRITION STATUS      Incontinent Diet (See d/c Summary )  AMBULATORY STATUS COMMUNICATION OF NEEDS Skin   Total Care Verbally Other (Comment), PU Stage and Appropriate Care (PUStage 4- Full Thickness exposed bone, Tendon Sacral daily dressing Dry &Intact PU Stage 3-Full Thickness, Tissue loss, tunneling pressure injury left buttocks Daily/ Wound Right Great Big Toe Red Swollen PRN)       PU Stage 4 Dressing: Daily (Full Thickness loss w/  exposed bone Tendon or muscle/Sacral/ Dry Intact)               Personal Care Assistance Level of Assistance  Bathing, Feeding, Dressing Bathing Assistance: Maximum assistance Feeding assistance: Independent Dressing Assistance: Maximum assistance     Functional Limitations Info  Sight, Hearing, Speech Sight Info: Adequate Hearing Info: Adequate Speech Info: Adequate    SPECIAL CARE FACTORS FREQUENCY                       Contractures Contractures Info: Not present    Additional Factors Info  Code Status, Allergies Code Status Info: DNR Allergies Info: Ace Inhibitors, Chlorpromazine, Haloperidol Decanoate           Current Medications (06/13/2017):  This is the current hospital active medication list Current Facility-Administered Medications  Medication Dose Route Frequency Provider Last Rate Last Dose  . 0.9 %  sodium chloride infusion   Intravenous Once Rise Patience, MD   Stopped at 06/13/17 [phone]  . acetaminophen (TYLENOL) tablet 650 mg  650 mg Oral Q6H PRN Tawni Millers, MD   650 mg at 06/12/17 2235  . albuterol (PROVENTIL) (2.5 MG/3ML) 0.083% nebulizer solution 2.5 mg  2.5 mg Nebulization Q6H PRN Karmen Bongo, MD      . chlorhexidine (PERIDEX) 0.12 % solution 15 mL  15 mL Mouth/Throat BID Karmen Bongo, MD   15 mL at 06/13/17 1009  . Chlorhexidine Gluconate Cloth 2 % PADS 6 each  6 each Topical Daily Karmen Bongo, MD   6 each at 06/13/17 1013  . collagenase (SANTYL)  ointment   Topical Daily Arrien, Jimmy Picket, MD      . diphenhydrAMINE (BENADRYL) capsule 25 mg  25 mg Oral Q8H PRN Karmen Bongo, MD      . divalproex (DEPAKOTE ER) 24 hr tablet 250 mg  250 mg Oral Ivery Quale, MD   250 mg at 06/12/17 2219  . enoxaparin (LOVENOX) injection 40 mg  40 mg Subcutaneous Q24H Karmen Bongo, MD   40 mg at 06/12/17 2220  . feeding supplement (GLUCERNA SHAKE) (GLUCERNA SHAKE) liquid 237 mL  237 mL Oral BID BM Arrien, Jimmy Picket,  MD   237 mL at 06/13/17 1011  . feeding supplement (PRO-STAT SUGAR FREE 64) liquid 30 mL  30 mL Oral BID Arrien, Jimmy Picket, MD   30 mL at 06/13/17 1010  . ferrous sulfate tablet 325 mg  325 mg Oral BID WC Karmen Bongo, MD   325 mg at 06/13/17 0737  . fluconazole (DIFLUCAN) tablet 200 mg  200 mg Oral Daily Karmen Bongo, MD   200 mg at 06/13/17 1011  . furosemide (LASIX) injection 40 mg  40 mg Intravenous Once Alekh, Kshitiz, MD      . furosemide (LASIX) tablet 20 mg  20 mg Oral Daily Karmen Bongo, MD   20 mg at 06/13/17 1011  . gabapentin (NEURONTIN) capsule 300 mg  300 mg Oral TID Karmen Bongo, MD   300 mg at 06/13/17 1010  . insulin aspart (novoLOG) injection 0-15 Units  0-15 Units Subcutaneous TID WC Karmen Bongo, MD   8 Units at 06/13/17 0740  . insulin detemir (LEVEMIR) injection 10 Units  10 Units Subcutaneous Daily Arrien, Jimmy Picket, MD   10 Units at 06/13/17 1012   And  . insulin detemir (LEVEMIR) injection 10 Units  10 Units Subcutaneous QHS Tawni Millers, MD   10 Units at 06/12/17 2234  . ipratropium-albuterol (DUONEB) 0.5-2.5 (3) MG/3ML nebulizer solution 3 mL  3 mL Nebulization BID Arrien, Jimmy Picket, MD   3 mL at 06/13/17 0919  . latanoprost (XALATAN) 0.005 % ophthalmic solution 1 drop  1 drop Both Eyes QHS Karmen Bongo, MD   1 drop at 06/12/17 2340  . loratadine (CLARITIN) tablet 10 mg  10 mg Oral q morning - 10a Karmen Bongo, MD   10 mg at 06/13/17 1012  . magnesium hydroxide (MILK OF MAGNESIA) suspension 30 mL  30 mL Oral Daily PRN Karmen Bongo, MD      . metoprolol succinate (TOPROL-XL) 24 hr tablet 25 mg  25 mg Oral Daily Karmen Bongo, MD   25 mg at 06/13/17 1011  . oxyCODONE (Oxy IR/ROXICODONE) immediate release tablet 30 mg  30 mg Oral Q6H Karmen Bongo, MD   30 mg at 06/13/17 1009  . pantoprazole (PROTONIX) EC tablet 40 mg  40 mg Oral Daily Karmen Bongo, MD   40 mg at 06/13/17 1011  . piperacillin-tazobactam (ZOSYN) IVPB  3.375 g  3.375 g Intravenous Q8H Dorrene German, RPH 12.5 mL/hr at 06/13/17 0736 3.375 g at 06/13/17 0736  . predniSONE (DELTASONE) tablet 10 mg  10 mg Oral Q breakfast Aline August, MD   10 mg at 06/13/17 0736  . risperiDONE (RISPERDAL) tablet 1.5 mg  1.5 mg Oral QHS Karmen Bongo, MD   1.5 mg at 06/12/17 2224  . simvastatin (ZOCOR) tablet 10 mg  10 mg Oral Ivery Quale, MD   10 mg at 06/12/17 2225  . sodium chloride flush (NS) 0.9 % injection 10-40 mL  10-40  mL Intracatheter PRN Karmen Bongo, MD      . sodium chloride flush (NS) 0.9 % injection 10-40 mL  10-40 mL Intracatheter PRN Aline August, MD   10 mL at 06/13/17 0737  . tiZANidine (ZANAFLEX) tablet 2 mg  2 mg Oral BID Karmen Bongo, MD   2 mg at 06/13/17 1012  . vancomycin (VANCOCIN) IVPB 1000 mg/200 mL premix  1,000 mg Intravenous Q12H Thomes Lolling, [facility]   Stopped at 06/13/17 9798     Discharge Medications: Please see discharge summary for a list of discharge medications.  Relevant Imaging Results:  Relevant Lab Results:   Additional Information SSN: [id number]  Lia Hopping, LCSW

## 2017-06-13 NOTE — Care Management Important Message (Signed)
Important Message  Patient Details  Name: Shannon Schmidt MRN: [id number] Date of Birth: 1941-03-21   Medicare Important Message Given:  Yes    Kerin Salen 06/13/2017, 12:48 [place] Message  Patient Details  Name: Shannon Schmidt MRN: [id number] Date of Birth: Apr 01, 1941   Medicare Important Message Given:  Yes    Kerin Salen 06/13/2017, 12:48 PM

## 2017-06-13 NOTE — Progress Notes (Signed)
Pharmacy Antibiotic Note  Shannon Schmidt is a 77 y.o. female admitted on 06/08/2017 with pneumonia, possible aspiration and also concerns for diabetic foot infection. Per NH MAR she was recently on IV Cefepime & Vancomycin which changed to oral Levaquin on 06/02/17.  She continues on Levaquin & Prednisone 20mg  for persistent PNA at NH however she has worsened & presents with fever and increased O2 requirements.  PMH is also pertinent for chronic osteomyelitis of foot, multiple pressure ulcers- bilateral feet and sacral.  Pharmacy has been consulted for Vancomycin & Zosyn dosing.  Today, 06/13/2017:  Day #6 antibiotics   SCr stable wnl  WBC normalized  Remains afebrile  Most recent VT therapeutic on 7/14  Seen by ID today but no recommendations posted yet as far as de-escalation or LOT  Plan:  Continue Vancomycin 1gm IV q12h.   Continue Zosyn 3.375g IV q8h (4 hour infusion time).   Monitor clinical course, renal function, cultures as available   Height: 5\' 10"  (177.8 cm) Weight: 206 lb 9.1 oz (93.7 kg) IBW/kg (Calculated) : 68.5  Temp (24hrs), Avg:98.7 F (37.1 C), Min:98.1 F (36.7 C), Max:99.6 F (37.6 C)   Recent Labs Lab 06/08/17 1745 06/09/17 0137 06/09/17 0359 06/09/17 1524 06/10/17 0500 06/10/17 1700 06/11/17 0417 06/12/17 0422 06/13/17 0415  WBC  --   --  10.8* 15.0* 11.8*  --  10.9*  --  10.1  CREATININE  --   --  0.53  --  0.63  --  0.64 0.57 0.69  LATICACIDVEN 1.33 1.2  --   --   --   --   --   --   --   VANCOTROUGH  --   --   --   --   --  15  --   --   --     Estimated Creatinine Clearance: 76.6 mL/min (by C-G formula based on SCr of 0.69 mg/dL).    Allergies  Allergen Reactions  . Ace Inhibitors Other (See Comments)    Unknown   . Chlorpromazine Other (See Comments)    Unknown  . Haloperidol Decanoate Other (See Comments)    Unknown    Antimicrobials this admission: 7/12 Vanc >>  7/12 Zosyn >> 7/12 Cefepime >> x1 7/12 fluconazole >>   7/12 Flagyl >> 7/13  Dose adjustments this admission: 7/14 1700 VT: 15 mcg/ml  on 1g q12h   Microbiology results: 7/12 BCx: ngtd 7/12 UCx:  NGF 7/13 sputum cx: few Candida albicans 7/12 MRSA PCR neg 7/12 step pneumo ur ag: neg  Thank you for allowing pharmacy to be a part of this patient's care.   Reuel Boom, PharmD, BCPS Pager: [phone] 06/13/2017, 3:31 PM

## 2017-06-14 ENCOUNTER — Encounter (HOSPITAL_COMMUNITY)
Admission: EM | Disposition: A | Discharge status: Skilled Nursing Facility | Payer: Self-pay | Source: Home / Self Care | Attending: Internal Medicine

## 2017-06-14 ENCOUNTER — Inpatient Hospital Stay (HOSPITAL_COMMUNITY): Payer: Medicare Other | Admitting: Registered Nurse

## 2017-06-14 ENCOUNTER — Encounter (HOSPITAL_COMMUNITY): Payer: Self-pay | Admitting: *Deleted

## 2017-06-14 DIAGNOSIS — D122 Benign neoplasm of ascending colon: Secondary | ICD-10-CM

## 2017-06-14 DIAGNOSIS — K31819 Angiodysplasia of stomach and duodenum without bleeding: Secondary | ICD-10-CM

## 2017-06-14 DIAGNOSIS — D123 Benign neoplasm of transverse colon: Secondary | ICD-10-CM

## 2017-06-14 DIAGNOSIS — K449 Diaphragmatic hernia without obstruction or gangrene: Secondary | ICD-10-CM

## 2017-06-14 DIAGNOSIS — K2950 Unspecified chronic gastritis without bleeding: Secondary | ICD-10-CM

## 2017-06-14 HISTORY — PX: COLONOSCOPY WITH PROPOFOL: SHX5780

## 2017-06-14 HISTORY — PX: ESOPHAGOGASTRODUODENOSCOPY (EGD) WITH PROPOFOL: SHX5813

## 2017-06-14 LAB — CBC WITH DIFFERENTIAL/PLATELET
BASOS ABS: 0 10*3/uL (ref 0.0–0.1)
Basophils Relative: 0 %
EOS ABS: 0.2 10*3/uL (ref 0.0–0.7)
Eosinophils Relative: 1 %
HCT: 33.1 % — ABNORMAL LOW (ref 36.0–46.0)
HEMOGLOBIN: 10.4 g/dL — AB (ref 12.0–15.0)
LYMPHS PCT: 13 %
Lymphs Abs: 2.4 10*3/uL (ref 0.7–4.0)
MCH: 24.3 pg — ABNORMAL LOW (ref 26.0–34.0)
MCHC: 31.4 g/dL (ref 30.0–36.0)
MCV: 77.3 fL — ABNORMAL LOW (ref 78.0–100.0)
MONOS PCT: 6 %
Monocytes Absolute: 1.1 10*3/uL — ABNORMAL HIGH (ref 0.1–1.0)
NEUTROS PCT: 80 %
Neutro Abs: 15 10*3/uL — ABNORMAL HIGH (ref 1.7–7.7)
Platelets: 514 10*3/uL — ABNORMAL HIGH (ref 150–400)
RBC: 4.28 MIL/uL (ref 3.87–5.11)
RDW: 22.3 % — ABNORMAL HIGH (ref 11.5–15.5)
WBC: 18.7 10*3/uL — AB (ref 4.0–10.5)

## 2017-06-14 LAB — TYPE AND SCREEN
ABO/RH(D): O POS
Antibody Screen: NEGATIVE
UNIT DIVISION: 0

## 2017-06-14 LAB — GLUCOSE, CAPILLARY
GLUCOSE-CAPILLARY: 101 mg/dL — AB (ref 65–99)
Glucose-Capillary: 131 mg/dL — ABNORMAL HIGH (ref 65–99)
Glucose-Capillary: 242 mg/dL — ABNORMAL HIGH (ref 65–99)
Glucose-Capillary: 250 mg/dL — ABNORMAL HIGH (ref 65–99)

## 2017-06-14 LAB — BPAM RBC
BLOOD PRODUCT EXPIRATION DATE: 201808092359
ISSUE DATE / TIME: 201807171308
UNIT TYPE AND RH: 5100

## 2017-06-14 LAB — COMPREHENSIVE METABOLIC PANEL
ALBUMIN: 2.2 g/dL — AB (ref 3.5–5.0)
ALK PHOS: 79 U/L (ref 38–126)
ALT: 24 U/L (ref 14–54)
ANION GAP: 11 (ref 5–15)
AST: 26 U/L (ref 15–41)
BUN: 6 mg/dL (ref 6–20)
CALCIUM: 8.4 mg/dL — AB (ref 8.9–10.3)
CO2: 39 mmol/L — AB (ref 22–32)
Chloride: 93 mmol/L — ABNORMAL LOW (ref 101–111)
Creatinine, Ser: 0.56 mg/dL (ref 0.44–1.00)
GFR calc Af Amer: >60 mL/min (ref >60)
GFR calc non Af Amer: >60 mL/min (ref >60)
GLUCOSE: 113 mg/dL — AB (ref 65–99)
POTASSIUM: 3 mmol/L — AB (ref 3.5–5.1)
SODIUM: 143 mmol/L (ref 135–145)
Total Bilirubin: 0.3 mg/dL (ref 0.3–1.2)
Total Protein: 7.1 g/dL (ref 6.5–8.1)

## 2017-06-14 LAB — MAGNESIUM: Magnesium: 1.5 mg/dL — ABNORMAL LOW (ref 1.7–2.4)

## 2017-06-14 LAB — VITAMIN B12: Vitamin B-12: 772 pg/mL (ref 180–914)

## 2017-06-14 LAB — TSH: TSH: 2.728 u[IU]/mL (ref 0.350–4.500)

## 2017-06-14 SURGERY — ESOPHAGOGASTRODUODENOSCOPY (EGD) WITH PROPOFOL
Anesthesia: Monitor Anesthesia Care

## 2017-06-14 MED ORDER — PROPOFOL 10 MG/ML IV BOLUS
INTRAVENOUS | Status: DC | PRN
Start: 2017-06-14 — End: 2017-06-14
  Administered 2017-06-14 (×7): 10 mg via INTRAVENOUS
  Administered 2017-06-14: 20 mg via INTRAVENOUS
  Administered 2017-06-14: 10 mg via INTRAVENOUS
  Administered 2017-06-14: 20 mg via INTRAVENOUS
  Administered 2017-06-14 (×7): 10 mg via INTRAVENOUS
  Administered 2017-06-14: 20 mg via INTRAVENOUS
  Administered 2017-06-14 (×5): 10 mg via INTRAVENOUS

## 2017-06-14 MED ORDER — DEXTROSE 5 % IV SOLN
2.0000 g | Freq: Two times a day (BID) | INTRAVENOUS | Status: DC
  Administered 2017-06-15: 2 g via INTRAVENOUS
  Filled 2017-06-14 (×3): qty 2

## 2017-06-14 MED ORDER — POTASSIUM CHLORIDE CRYS ER 20 MEQ PO TBCR
40.0000 meq | EXTENDED_RELEASE_TABLET | ORAL | Status: AC
  Administered 2017-06-14 (×2): 40 meq via ORAL
  Filled 2017-06-14 (×2): qty 2

## 2017-06-14 MED ORDER — POTASSIUM CHLORIDE CRYS ER 20 MEQ PO TBCR: 80.0000 meq | EXTENDED_RELEASE_TABLET | Freq: Once | ORAL | Status: DC

## 2017-06-14 MED ORDER — MAGNESIUM SULFATE 2 GM/50ML IV SOLN
2.0000 g | Freq: Once | INTRAVENOUS | Status: AC
  Administered 2017-06-14: 2 g via INTRAVENOUS
  Filled 2017-06-14 (×2): qty 50

## 2017-06-14 MED ORDER — PROPOFOL 10 MG/ML IV BOLUS
INTRAVENOUS | Status: AC
  Filled 2017-06-14: qty 20

## 2017-06-14 MED ORDER — LACTATED RINGERS IV SOLN
INTRAVENOUS | Status: DC | PRN
  Administered 2017-06-14: 11:00:00 via INTRAVENOUS

## 2017-06-14 MED ORDER — PIPERACILLIN-TAZOBACTAM 3.375 G IVPB
3.3750 g | Freq: Three times a day (TID) | INTRAVENOUS | Status: AC
  Administered 2017-06-14 – 2017-06-15 (×2): 3.375 g via INTRAVENOUS
  Filled 2017-06-14 (×4): qty 50

## 2017-06-14 MED ORDER — PIPERACILLIN-TAZOBACTAM 3.375 G IVPB: 3.3750 g | Freq: Three times a day (TID) | INTRAVENOUS | Status: DC

## 2017-06-14 MED ORDER — PROPOFOL 10 MG/ML IV BOLUS
INTRAVENOUS | Status: AC
  Filled 2017-06-14: qty 40

## 2017-06-14 MED ORDER — DEXTROSE 5 % IV SOLN: 2.0000 g | Freq: Two times a day (BID) | INTRAVENOUS | Status: DC

## 2017-06-14 SURGICAL SUPPLY — 24 items

## 2017-06-14 NOTE — Progress Notes (Signed)
Patient ID: Shannon Schmidt, female   DOB: November 30, 1940, 76 y.o.   MRN: [id number]  PROGRESS NOTE    DERETHA ERTLE  YTK:[id number] DOB: [birth year] DOA: 06/08/2017 PCP: Theressa Millard, MD   Brief Narrative:  76 y.o female with history of COPD, diabetes mellitus type 2, chronic feet osteomyelitis, chronic bedridden status, chronic decubitus ulcers and recent history of pneumonia who is been on various antibiotics including cefepime and vancomycin for a week several weeks ago and more recently was on Levaquin from 06/04/2017 to be scheduled to 06/14/2017 along with prednisone taper started on 05/21/2017 for 20 days was sent from nursing home for evaluation of hemoglobin 6.3 and need for blood transfusion. She received 2 units of packed red cells. She was also found to have new right upper lobe infiltrate and right foot x-ray showed 1 destruction of the medial aspect of distal phalanx of the right great toe. She is on broad-spectrum antibiotics. MRI confirmed osteomyelitis. Orthopedics recommended medical management for now and outpatient followup. Hemoglobin was 6.6 on 06/13/2017; GI was consulted who plans for EGD and colonoscopy today. ID has also evaluated the patient.   Assessment & Plan:   Principal Problem:   Sepsis ([place]) Active Problems:   Diabetes mellitus ([place])   Decubitus ulcers   HTN (hypertension)   Anemia   HCAP (healthcare-associated pneumonia)   Osteomyelitis ([facility])   Oral thrush   Severe malnutrition ([facility])   Hypocalcemia   History of colonic polyps    1. Symptomatic multifactorial anemia. SPblood transfusion, 3 units prbc since admission - Hemoglobin was 6.6 on 06/13/2017, follow hemoglobin today.  - GI evaluation appreciated.Patient is scheduled for EGD and colonoscopy today.  2. Right upper lobe pneumonia, present on admission - Currently on Zosyn and vancomycin. Cultures negative so far. Respiratory Status stable. Continue oxygen supplementation via nasal  cannula. Finish 7 day of antibiotic treatment. -Aspiration precautions. Diet as per SLP recommendations - Taper prednisone - Sputum culture had shown some rare yeast; she was on Diflucan which was discontinued on 06/13/2017 by ID  3. Rightfoot osteomyelitis. - Patient probably has chronic osteomyelitis of both feet because of her chronic bedridden status. It is unclear if the right foot osteomyelitis is acute onchronic. Currently on broad-spectrum antibiotics Zosyn and vancomycin - MRI of the right foot is showing early osteomyelitis of the right great toe. - Orthopedics recommends medical management and outpatient follow-up with Dr. Sharol Given -ID recommendsto continue vancomycin for 6 weeks and Zosyn for 7 days then change to cefepime to complete 6 weeks  4. Multiple stage IVdecubitus ulcers present on admission. Continue local wound careand follow recommendations from wound care nurse.  5. Type 2 diabetes mellitus. - Continue sliding scale insulin along with long-acting and short-acting insulin.  6. Hypertension. Continue with Furosemide and metoprolol succinate. Blood pressure on the higher side; we'll monitor  7. Depression. Continue risperidone and depakote. No agitation, continue neuro checks per unit protocol.   8. Bedridden status - Fall precautions.  9. Hypoalbuminemia - Nutrition consult  DVT prophylaxis:SCDs Code Status:DNR Family Communication:None at bedside Disposition Plan:Nursing home in 1-3days   Consultants:  Orthopedics/ID/GI  Procedures:None    Antimicrobials:   Vancomycin and Zosyn from 06/08/2017 onwards  Diflucan from 06/08/2017 to 06/13/2017  Subjective: Patient is seen and examined at bedside. She is awake and answers some questions but is a poor historian. No overnight vomiting, black or bloody stools or fever or chest pain.  Objective: Vitals:   06/13/17 2245 06/13/17 2315 06/14/17  0650 06/14/17 0848  BP: 137/70  (!)  182/92   Pulse: 91  (!) 109 85  Resp: 20  20 18   Temp: 98 F (36.7 C)  98.3 F (36.8 C)   TempSrc: Oral  Oral   SpO2: 96% 92% 96% 95%  Weight:      Height:        Intake/Output Summary (Last 24 hours) at 06/14/17 1251 Last data filed at 06/14/17 1000  Gross per 24 hour  Intake            618.5 ml  Output             5850 ml  Net          -5231.5 ml   Filed Weights   06/08/17 1606 06/08/17 2216  Weight: 89.4 kg (197 lb) 93.7 kg (206 lb 9.1 oz)    Examination:  General exam: Appears calm and comfortable  Respiratory system: Bilateral decreased breath sound at bases With scattered crackles Cardiovascular system: S1 & S2 heard, rate controlled  Gastrointestinal system: Abdomen is nondistended, soft and nontender. Normal bowel sounds heard. Left-sided colostomy present Extremities: No cyanosis, clubbing, bilateral lower extremity 1+ edema with right great toe showing dark discoloration of the skin     Data Reviewed: I have personally reviewed following labs and imaging studies  CBC:  Recent Labs Lab 06/09/17 1524 06/10/17 0500 06/11/17 0417 06/11/17 0900 06/13/17 0415 06/14/17 0904  WBC 15.0* 11.8* 10.9*  --  10.1 18.7*  NEUTROABS 13.9* 9.7* 8.4*  --  7.4 15.0*  HGB 8.1* 8.3* 7.4* 8.4* 6.6* 10.4*  HCT 25.8* 25.8* 23.7* 27.1* 21.0* 33.1*  MCV 73.5* 74.4* 75.7*  --  76.6* 77.3*  PLT 375 406* 380  --  348 193*   Basic Metabolic Panel:  Recent Labs Lab 06/09/17 0137 06/09/17 0359 06/10/17 0500 06/11/17 0417 06/12/17 0422 06/13/17 0415 06/14/17 0904  NA  --  137 135 141  --  138 143  K  --  3.8 3.9 4.2  --  4.1 3.0*  CL  --  103 99* 105  --  98* 93*  CO2  --  27 30 31   --  33* 39*  GLUCOSE  --  90 180* 183*  --  213* 113*  BUN  --  11 12 12   --  10 6  CREATININE  --  0.53 0.63 0.64 0.57 0.69 0.56  CALCIUM  --  7.3* 7.6* 7.7*  --  7.9* 8.4*  MG 2.1  --   --   --   --  1.7 1.5*   GFR: Estimated Creatinine Clearance: 76.6 mL/min (by C-G formula based on  SCr of 0.56 mg/dL). Liver Function Tests:  Recent Labs Lab 06/08/17 1617 06/13/17 0415 06/14/17 0904  AST 42* 15 26  ALT 21 17 24   ALKPHOS 70 77 79  BILITOT 0.8 0.2* 0.3  PROT 7.2 5.9* 7.1  ALBUMIN 2.1* 1.8* 2.2*   No results for input(s): LIPASE, AMYLASE in the last 168 hours. No results for input(s): AMMONIA in the last 168 hours. Coagulation Profile:  Recent Labs Lab 06/09/17 0137  INR 1.55   Cardiac Enzymes: No results for input(s): CKTOTAL, CKMB, CKMBINDEX, TROPONINI in the last 168 hours. BNP (last 3 results) No results for input(s): PROBNP in the last 8760 hours. HbA1C: No results for input(s): HGBA1C in the last 72 hours. CBG:  Recent Labs Lab 06/12/17 2155 06/13/17 0740 06/13/17 1143 06/13/17 1759 06/14/17 0813  GLUCAP 257*  275* 322* 263* 101*   Lipid Profile: No results for input(s): CHOL, HDL, LDLCALC, TRIG, CHOLHDL, LDLDIRECT in the last 72 hours. Thyroid Function Tests:  Recent Labs  06/14/17 0904  TSH 2.728   Anemia Panel:  Recent Labs  06/13/17 1121  TIBC 210*  IRON 23*   Sepsis Labs:  Recent Labs Lab 06/08/17 1745 06/09/17 0137 06/09/17 0359 06/10/17 0500  PROCALCITON  --  2.11 1.80 6.97  LATICACIDVEN 1.33 1.2  --   --     Recent Results (from the past 240 hour(s))  Blood Culture (routine x 2)     Status: None   Collection Time: 06/08/17  4:17 PM  Result Value Ref Range Status   Specimen Description BLOOD  Final   Special Requests   Final    BOTTLES DRAWN AEROBIC AND ANAEROBIC Blood Culture adequate volume   Culture   Final    NO GROWTH 5 DAYS Performed at [facility], [address] [place], [place] [address]    Report Status 06/13/2017 FINAL  Final  Blood Culture (routine x 2)     Status: None   Collection Time: 06/08/17  4:45 PM  Result Value Ref Range Status   Specimen Description BLOOD BLOOD RIGHT HAND  Final   Special Requests IN PEDIATRIC BOTTLE Blood Culture adequate volume  Final   Culture   Final      NO GROWTH 5 DAYS Performed at [facility], [place] [address], [place], [place] [address]    Report Status 06/13/2017 FINAL  Final  Urine culture     Status: None   Collection Time: 06/08/17  6:23 PM  Result Value Ref Range Status   Specimen Description URINE, CATHETERIZED  Final   Special Requests Normal  Final   Culture   Final    NO GROWTH Performed at [facility], [place] [address] [place], [place] [address]    Report Status 06/10/2017 FINAL  Final  MRSA PCR Screening     Status: None   Collection Time: 06/08/17  9:36 PM  Result Value Ref Range Status   MRSA by PCR NEGATIVE NEGATIVE Final    Comment:        The GeneXpert MRSA Assay (FDA approved for NASAL specimens only), is one component of a comprehensive MRSA colonization surveillance program. It is not intended to diagnose MRSA infection nor to guide or monitor treatment for MRSA infections.   Culture, sputum-assessment     Status: None   Collection Time: 06/09/17  3:59 AM  Result Value Ref Range Status   Specimen Description SPU  Final   Special Requests Normal  Final   Sputum evaluation THIS SPECIMEN IS ACCEPTABLE FOR SPUTUM CULTURE  Final   Report Status 06/09/2017 FINAL  Final  Culture, respiratory (NON-Expectorated)     Status: None   Collection Time: 06/09/17  3:59 AM  Result Value Ref Range Status   Specimen Description SPU  Final   Special Requests Normal Reflexed from [id number]  Final   Gram Stain   Final    MODERATE WBC PRESENT,BOTH PMN AND MONONUCLEAR RARE SQUAMOUS EPITHELIAL CELLS PRESENT RARE YEAST Performed at [facility], [place] [address], [place], [place] [address]    Culture FEW CANDIDA ALBICANS  Final   Report Status 06/11/2017 FINAL  Final         Radiology Studies: Mr Foot Right W Wo Contrast  Result Date: 06/12/2017 CLINICAL DATA:  Chronic foot infection. Patient gives  a history of chronic osteomyelitis. Abnormal radiographs dated 06/08/2017. EXAM: MRI OF THE RIGHT  FOREFOOT WITHOUT AND WITH CONTRAST TECHNIQUE: Multiplanar, multisequence MR imaging of the right forefoot was performed before and after the administration of intravenous contrast. CONTRAST:  29mL MULTIHANCE GADOBENATE DIMEGLUMINE 529 MG/ML IV SOLN COMPARISON: None. FINDINGS: Bones/Joint/Cartilage There is abnormal enhancement and edema in the medial aspect of the head of the first metatarsal. There is very subtle abnormal edema and enhancement of the medial aspect of the tuft of the distal phalanx of the great toe. There is no cortical destruction at either site. There is also slight abnormal edema and enhancement of the plantar aspect of the head of the second metatarsal. There are no appreciable overlying soft tissue ulcers. No joint effusions.  The other bones of the forefoot appear normal. Soft tissues Prominent subcutaneous edema on the dorsum of the foot, nonspecific. The muscles appear normal. No abscesses. IMPRESSION: Abnormal edema and enhancement in the heads of the second and first metatarsals with a tiny area of abnormal edema and enhancement in the medial aspect of the tuft of the distal phalanx of the great toe. Findings are consistent with early osteomyelitis. Electronically Signed   By: Lorriane Shire M.D.   On: 06/12/2017 15:45        Scheduled Meds: . [MAR Hold] chlorhexidine  15 mL Mouth/Throat BID  . [MAR Hold] Chlorhexidine Gluconate Cloth  6 each Topical Daily  . [MAR Hold] collagenase   Topical Daily  . [MAR Hold] divalproex  250 mg Oral QHS  . [MAR Hold] enoxaparin (LOVENOX) injection  40 mg Subcutaneous Q24H  . [MAR Hold] feeding supplement (PRO-STAT SUGAR FREE 64)  30 mL Oral BID  . [MAR Hold] ferrous sulfate  325 mg Oral BID WC  . [MAR Hold] furosemide  20 mg Oral Daily  . [MAR Hold] gabapentin  300 mg Oral TID  . [MAR Hold] insulin aspart  0-15 Units Subcutaneous TID WC  . [MAR Hold] insulin detemir  10 Units Subcutaneous Daily   And  . [MAR Hold] insulin detemir  10  Units Subcutaneous QHS  . [MAR Hold] ipratropium-albuterol  3 mL Nebulization BID  . [MAR Hold] latanoprost  1 drop Both Eyes QHS  . [MAR Hold] loratadine  10 mg Oral q morning - 10a  . [MAR Hold] metoprolol succinate  25 mg Oral Daily  . [MAR Hold] oxycodone  30 mg Oral Q6H  . [MAR Hold] pantoprazole  40 mg Oral Daily  . [MAR Hold] predniSONE  10 mg Oral Q breakfast  . [MAR Hold] protein supplement shake  11 oz Oral BID BM  . [MAR Hold] risperiDONE  1.5 mg Oral QHS  . [MAR Hold] simvastatin  10 mg Oral QHS  . [MAR Hold] tiZANidine  2 mg Oral BID   Continuous Infusions: . [MAR Hold] sodium chloride Stopped (06/13/17 0931)  . [MAR Hold] ceFEPime (MAXIPIME) IV    . [MAR Hold] piperacillin-tazobactam (ZOSYN)  IV    . [MAR Hold] vancomycin Stopped (06/14/17 0555)     LOS: 6 days        Aline August, MD [facility] Pager [phone]  If 7PM-7AM, please contact night-coverage [email or web address] Password [facility] 06/14/2017, 12:51 PM

## 2017-06-14 NOTE — Progress Notes (Signed)
PHARMACY CONSULT NOTE FOR:  OUTPATIENT  PARENTERAL ANTIBIOTIC THERAPY (OPAT)  Indication: osteomyelitis Regimen: Ceftriaxone 2g IV q24h, Vancomycin 1g IV q12h, Flagyl 500 mg PO q8h End date: 07/23/17  IV antibiotic discharge orders are pended. To discharging provider:  please sign these orders via discharge navigator,  Select New Orders & click on the button choice - Manage This Unsigned Work.     Thank you for allowing pharmacy to be a part of this patient's care.  Shannon Schmidt 06/14/2017, 4:12 PM

## 2017-06-14 NOTE — H&P (View-Only) (Signed)
Referring Provider: Dr. Starla Link Primary Care Physician:  Theressa Millard, MD Primary Gastroenterologist:  Althia Forts  Reason for Consultation:  Microcytic anemia  HPI: Shannon Schmidt is a 76 y.o. female with history of COPD, diabetes mellitus type 2, chronic feet osteomyelitis, chronic bedridden status, chronic decubitus ulcers and recent history of pneumonia.  Had diverting colostomy in about 2007 because of the decubitus ulcers.  She was sent from nursing home for evaluation of hemoglobin 6.3 grams and need for blood transfusion. She received 2 units of packed red cells. She was also found to have new right upper lobe infiltrate and right foot x-ray showed 1 destruction of the medial aspect of distal phalanx of the right great toe. She is on broad-spectrum antibiotics. MRI confirmed osteomyelitis. Orthopedics recommended medical management for now and outpatient followup.  ID going to see.    Hgb responded well with increase to 8 gram range, but today was down to 6.6 grams again.  Going to receive another unit PRBC's.  No overt GI bleeding and has been heme negative x 2.  Patient denies abdominal pain.  Not aware of any issues with the ostomy or bleeding.  Says that she eats well and denies weight loss to her knowledge.  On omeprazole 20 mg BID at home and pantoprazole 40 mg daily here.  Had been on prednisone recently (guessing for pulmonary issues).  Colonoscopy 04/2010 by Dr. Benson Norway while inpatient showed a 1 cm friable polyp in the transverse colon that was removed and was a TVA on pathology.  Also had some other small polyps that were not removed due to patient being on anticoagulation at that time.  2011 colonoscopy was also performed for IDA and hemoccult positive stool.   Past Medical History:  Diagnosis Date  . Anemia   . Bipolar 1 disorder ([place])   . Colostomy in place [facility])   . COPD (chronic obstructive pulmonary disease) ([place])   . Depression   . Diabetes mellitus   . GERD  (gastroesophageal reflux disease)   . H/O hiatal hernia   . Muscle weakness   . Neuropathy   . Osteomyelitis ([place])   . Paraplegia ([place])   . PONV (postoperative nausea and vomiting)   . RA (rheumatoid arthritis) ([place])   . Schizophrenia [facility])     Past Surgical History:  Procedure Laterality Date  . ABDOMINAL HYSTERECTOMY    . BREAST SURGERY     L breast abscess  . CATARACT EXTRACTION W/PHACO  05/09/2012   Procedure: CATARACT EXTRACTION PHACO AND INTRAOCULAR LENS PLACEMENT (IOC);  Surgeon: Marylynn Pearson, MD;  Location: [facility];  Service: Ophthalmology;  Laterality: Right;  . COLON SURGERY    . EYE SURGERY     L cataract  . GANGLION CYST EXCISION     R wrist x2  . HERNIA REPAIR     abdominal  . JOINT REPLACEMENT     knee  . KNEE SURGERY    . WOUND DEBRIDEMENT      Prior to Admission medications   Medication Sig Start Date End Date Taking? Authorizing Provider  acetaminophen (MAPAP) 325 MG tablet Take 650 mg by mouth every 8 (eight) hours as needed for mild pain or fever. For pain or fever.    Yes [provider]  albuterol (PROVENTIL) (2.5 MG/3ML) 0.083% nebulizer solution Take 2.5 mg by nebulization every 6 (six) hours as needed for wheezing or shortness of breath.   Yes [provider]  divalproex (DEPAKOTE ER) 250 MG  24 hr tablet Take 250 mg by mouth at bedtime.   Yes [provider]  ferrous sulfate 325 (65 FE) MG tablet Take 325 mg by mouth 2 (two) times daily with a meal.   Yes [provider]  furosemide (LASIX) 20 MG tablet Take 20 mg by mouth daily.   Yes [provider]  gabapentin (NEURONTIN) 300 MG capsule Take 300 mg by mouth 3 (three) times daily.   Yes [provider]  insulin detemir (LEVEMIR) 100 UNIT/ML injection Inject 26-55 Units into the skin 2 (two) times daily. Inject 24units at 9a and 55 units at 5pm   Yes [provider]  insulin lispro (HUMALOG) 100 UNIT/ML injection Inject 0-10 Units into the  skin 2 (two) times daily. 61-150 0 units 151-200 2 units 201-250 4 units 251-300 6 units 301-350 8 units 351-400 10 units   Yes [provider]  ipratropium-albuterol (DUONEB) 0.5-2.5 (3) MG/3ML SOLN Take 3 mLs by nebulization 4 (four) times daily.   Yes [provider]  latanoprost (XALATAN) 0.005 % ophthalmic solution Place 1 drop into both eyes at bedtime.   Yes [provider]  levofloxacin (LEVAQUIN) 750 MG tablet Take 750 mg by mouth daily. Started 06/02/17.  Originally ordered x7days, but then changed until pneumonia resolves.   Yes [provider]  loratadine (CLARITIN) 10 MG tablet Take 10 mg by mouth every morning.    Yes [provider]  magnesium hydroxide (MILK OF MAGNESIA) 400 MG/5ML suspension Take 30 mLs by mouth daily as needed for mild constipation.   Yes [provider]  metFORMIN (GLUCOPHAGE) 500 MG tablet Take 500 mg by mouth daily with breakfast.   Yes [provider]  metoprolol succinate (TOPROL-XL) 25 MG 24 hr tablet Take 25 mg by mouth daily.   Yes [provider]  Multiple Vitamin (MULTIVITAMIN WITH MINERALS) TABS tablet Take 1 tablet by mouth daily.   Yes [provider]  omeprazole (PRILOSEC) 20 MG capsule Take 20 mg by mouth 2 (two) times daily before a meal. For heartburn.   Yes [provider]  oxycodone (ROXICODONE) 30 MG immediate release tablet Take 30 mg by mouth every 6 (six) hours.   Yes [provider]  predniSONE (DELTASONE) 20 MG tablet Take 20 mg by mouth daily with breakfast. Ordered for 20 days to complete on 7/16 then decrease to 10mg  for 1 day   Yes [provider]  risperiDONE (RISPERDAL) 1 MG tablet Take 1.5 mg by mouth at bedtime.    Yes [provider]  simethicone (MI-ACID GAS RELIEF) 80 MG chewable tablet Chew 80 mg by mouth every 12 (twelve) hours as needed. For gas.   Yes [provider]  simvastatin (ZOCOR) 10 MG tablet  Take 10 mg by mouth at bedtime.   Yes [provider]  sodium hypochlorite (DAKIN'S 1/4 STRENGTH) 0.125 % SOLN Irrigate with 1 application as directed every evening. Apply to left sacrum at dressing changes nightly   Yes [provider]  tiZANidine (ZANAFLEX) 2 MG tablet Take 2 mg by mouth 2 (two) times daily.   Yes [provider]  Vitamin D, Ergocalciferol, (DRISDOL) 50000 units CAPS capsule Take 50,000 Units by mouth every 30 (thirty) days.   Yes [provider]  diphenhydrAMINE (BENADRYL) 25 mg capsule Take 25 mg by mouth every 8 (eight) hours as needed for itching. For itching and/or swelling.    [provider]    Current Facility-Administered Medications  Medication  Dose Route Frequency Provider Last Rate Last Dose  . 0.9 %  sodium chloride infusion   Intravenous Once Rise Patience, MD   Stopped at 06/13/17 [phone]  . acetaminophen (TYLENOL) tablet 650 mg  650 mg Oral Q6H PRN Tawni Millers, MD   650 mg at 06/12/17 2235  . albuterol (PROVENTIL) (2.5 MG/3ML) 0.083% nebulizer solution 2.5 mg  2.5 mg Nebulization Q6H PRN Karmen Bongo, MD      . chlorhexidine (PERIDEX) 0.12 % solution 15 mL  15 mL Mouth/Throat BID Karmen Bongo, MD   15 mL at 06/12/17 2219  . Chlorhexidine Gluconate Cloth 2 % PADS 6 each  6 each Topical Daily Karmen Bongo, MD   6 each at 06/10/17 1510  . collagenase (SANTYL) ointment   Topical Daily Arrien, Jimmy Picket, MD      . diphenhydrAMINE (BENADRYL) capsule 25 mg  25 mg Oral Q8H PRN Karmen Bongo, MD      . divalproex (DEPAKOTE ER) 24 hr tablet 250 mg  250 mg Oral Ivery Quale, MD   250 mg at 06/12/17 2219  . enoxaparin (LOVENOX) injection 40 mg  40 mg Subcutaneous Q24H Karmen Bongo, MD   40 mg at 06/12/17 2220  . feeding supplement (GLUCERNA SHAKE) (GLUCERNA SHAKE) liquid 237 mL  237 mL Oral BID BM Arrien, Jimmy Picket, MD   237 mL at 06/12/17 1400  . feeding supplement (PRO-STAT SUGAR  FREE 64) liquid 30 mL  30 mL Oral BID Arrien, Jimmy Picket, MD   30 mL at 06/12/17 1028  . ferrous sulfate tablet 325 mg  325 mg Oral BID WC Karmen Bongo, MD   325 mg at 06/13/17 0737  . fluconazole (DIFLUCAN) tablet 200 mg  200 mg Oral Daily Karmen Bongo, MD   200 mg at 06/12/17 1026  . furosemide (LASIX) injection 40 mg  40 mg Intravenous Once Alekh, Kshitiz, MD      . furosemide (LASIX) tablet 20 mg  20 mg Oral Daily Karmen Bongo, MD   20 mg at 06/12/17 1026  . gabapentin (NEURONTIN) capsule 300 mg  300 mg Oral TID Karmen Bongo, MD   300 mg at 06/12/17 2221  . insulin aspart (novoLOG) injection 0-15 Units  0-15 Units Subcutaneous TID WC Karmen Bongo, MD   8 Units at 06/13/17 0740  . insulin detemir (LEVEMIR) injection 10 Units  10 Units Subcutaneous Daily Arrien, Jimmy Picket, MD   10 Units at 06/12/17 1029   And  . insulin detemir (LEVEMIR) injection 10 Units  10 Units Subcutaneous QHS Tawni Millers, MD   10 Units at 06/12/17 2234  . ipratropium-albuterol (DUONEB) 0.5-2.5 (3) MG/3ML nebulizer solution 3 mL  3 mL Nebulization BID Arrien, Jimmy Picket, MD   3 mL at 06/13/17 0919  . latanoprost (XALATAN) 0.005 % ophthalmic solution 1 drop  1 drop Both Eyes QHS Karmen Bongo, MD   1 drop at 06/12/17 2340  . loratadine (CLARITIN) tablet 10 mg  10 mg Oral q morning - 10a Karmen Bongo, MD   10 mg at 06/12/17 1026  . magnesium hydroxide (MILK OF MAGNESIA) suspension 30 mL  30 mL Oral Daily PRN Karmen Bongo, MD      . metoprolol succinate (TOPROL-XL) 24 hr tablet 25 mg  25 mg Oral Daily Karmen Bongo, MD   25 mg at 06/12/17 1026  . oxyCODONE (Oxy IR/ROXICODONE) immediate release tablet 30 mg  30 mg Oral Q6H Karmen Bongo, MD   30 mg at 06/12/17  2222  . pantoprazole (PROTONIX) EC tablet 40 mg  40 mg Oral Daily Karmen Bongo, MD   40 mg at 06/12/17 1029  . piperacillin-tazobactam (ZOSYN) IVPB 3.375 g  3.375 g Intravenous Q8H Dorrene German, RPH 12.5 mL/hr  at 06/13/17 0736 3.375 g at 06/13/17 0736  . predniSONE (DELTASONE) tablet 10 mg  10 mg Oral Q breakfast Aline August, MD   10 mg at 06/13/17 0736  . risperiDONE (RISPERDAL) tablet 1.5 mg  1.5 mg Oral QHS Karmen Bongo, MD   1.5 mg at 06/12/17 2224  . simvastatin (ZOCOR) tablet 10 mg  10 mg Oral Ivery Quale, MD   10 mg at 06/12/17 2225  . sodium chloride flush (NS) 0.9 % injection 10-40 mL  10-40 mL Intracatheter PRN Karmen Bongo, MD      . sodium chloride flush (NS) 0.9 % injection 10-40 mL  10-40 mL Intracatheter PRN Aline August, MD   10 mL at 06/13/17 0737  . tiZANidine (ZANAFLEX) tablet 2 mg  2 mg Oral BID Karmen Bongo, MD   2 mg at 06/12/17 2225  . vancomycin (VANCOCIN) IVPB 1000 mg/200 mL premix  1,000 mg Intravenous Q12H Thomes Lolling, [facility]   Stopped at 06/13/17 6962    Allergies as of 06/08/2017 - Review Complete 06/08/2017  Allergen Reaction Noted  . Ace inhibitors Other (See Comments) 02/06/2012  . Chlorpromazine Other (See Comments) 02/06/2012  . Haloperidol decanoate Other (See Comments) 02/06/2012    Family History  Problem Relation Age of Onset  . Hypertension Mother   . Diabetes type II Mother   . Stroke Father   . Cancer Other     Social History   Social History  . Marital status: Divorced    Spouse name: N/A  . Number of children: N/A  . Years of education: N/A   Occupational History  . retired    Social History Main Topics  . Smoking status: Former Smoker    Packs/day: 2.00    Years: 40.00    Quit date: 2006  . Smokeless tobacco: Never Used  . Alcohol use No  . Drug use: No  . Sexual activity: Not on file   Other Topics Concern  . Not on file   Social History Narrative  . No narrative on file    Review of Systems: ROS are O/W negative.  Physical Exam: Vital signs in last 24 hours: Temp:  [98.1 F (36.7 C)-98.2 F (36.8 C)] 98.1 F (36.7 C) (07/17 0420) Pulse Rate:  [87-90] 90 (07/17 0420) Resp:  [17-19] 17  (07/17 0420) BP: (155-173)/(82-83) 155/83 (07/17 0420) SpO2:  [90 %-100 %] 90 % (07/17 0919) Last BM Date: 06/13/17 General:  Alert, Well-developed, well-nourished, pleasant and cooperative in NAD Head:  Normocephalic and atraumatic. Eyes:  Sclera clear, no icterus.  Conjunctiva pink. Ears:  Normal auditory acuity. Mouth:  No deformity or lesions.   Neck:  Supple; no masses or thyromegaly. Lungs:  Course BS on the right anteriorly. Heart:  Regular rate and rhythm; no murmurs, clicks, rubs, or gallops. Abdomen:  Soft, non-distended.  BS present.  Non-tender.  Ostomy noted on the left abdomen. Rectal:  Deferred.  Heme negative x 2.  Msk:  Symmetrical without gross deformities. Pulses:  Normal pulses noted. Extremities:  2+ pitting edema in B/L LE's. Neurologic:  Alert and oriented x 4;  grossly normal neurologically. Psych:  Alert and cooperative. Normal mood and affect.  Intake/Output from previous day: 07/16 0701 - 07/17 0700  In: 350 [P.O.:350] Out: 3000 [Urine:3000] Intake/Output this shift: Total I/O In: 240 [P.O.:240] Out: 1200 [Urine:1200]  Lab Results:  Recent Labs  06/11/17 0417 06/11/17 0900 06/13/17 0415  WBC 10.9*  --  10.1  HGB 7.4* 8.4* 6.6*  HCT 23.7* 27.1* 21.0*  PLT 380  --  348   BMET  Recent Labs  06/11/17 0417 06/12/17 0422 06/13/17 0415  NA 141  --  138  K 4.2  --  4.1  CL 105  --  98*  CO2 31  --  33*  GLUCOSE 183*  --  213*  BUN 12  --  10  CREATININE 0.64 0.57 0.69  CALCIUM 7.7*  --  7.9*   LFT  Recent Labs  06/13/17 0415  PROT 5.9*  ALBUMIN 1.8*  AST 15  ALT 17  ALKPHOS 77  BILITOT 0.2*   Studies/Results: Mr Foot Right W Wo Contrast  Result Date: 06/12/2017 CLINICAL DATA:  Chronic foot infection. Patient gives a history of chronic osteomyelitis. Abnormal radiographs dated 06/08/2017. EXAM: MRI OF THE RIGHT FOREFOOT WITHOUT AND WITH CONTRAST TECHNIQUE: Multiplanar, multisequence MR imaging of the right forefoot was performed  before and after the administration of intravenous contrast. CONTRAST:  64mL MULTIHANCE GADOBENATE DIMEGLUMINE 529 MG/ML IV SOLN COMPARISON: None. FINDINGS: Bones/Joint/Cartilage There is abnormal enhancement and edema in the medial aspect of the head of the first metatarsal. There is very subtle abnormal edema and enhancement of the medial aspect of the tuft of the distal phalanx of the great toe. There is no cortical destruction at either site. There is also slight abnormal edema and enhancement of the plantar aspect of the head of the second metatarsal. There are no appreciable overlying soft tissue ulcers. No joint effusions.  The other bones of the forefoot appear normal. Soft tissues Prominent subcutaneous edema on the dorsum of the foot, nonspecific. The muscles appear normal. No abscesses. IMPRESSION: Abnormal edema and enhancement in the heads of the second and first metatarsals with a tiny area of abnormal edema and enhancement in the medial aspect of the tuft of the distal phalanx of the great toe. Findings are consistent with early osteomyelitis. Electronically Signed   By: Lorriane Shire M.D.   On: 06/12/2017 15:45   IMPRESSION:  -Severe microcytic anemia:  Hgb 6.8 grams on admission, transfused with 2 units PRBC's and initially good response, but now Hgb back down to 6.6 grams today (2 gram drop from 2 days ago). Hemoccult negative on admission and again today.  No sign of overt bleeding.  Ferritin normal.  Other iron studies have not been checked.  Going to receive another unit PRBC's.  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 -Personal history of colon polyps:  1 cm TVA in 2011. -RUL PNA:  On abx and Diflucan (yeast present on sputum culture).  Respiratory status stable. -Multiple stage IV decubitus ulcers -Right foot osteomyelitis:  ID seeing patient and ortho recommended outpatient follow-up. -Type 2 DM:  On insulin.   -S/p diverting colostomy due to decubitus ulcers in about 2007. -Severe  protein-calorie malnutrition:  Pre-albumin <5.  PLAN: -Will check iron studies and if very low then may benefit from iron infusion while inpatient. -Will check a haptoglobin. -I had a long discussion with the patient regarding evaluation of this type of issue from a GI standpoint.  She understands that EGD and colonoscopy will require her to be put to sleep and that she will have to drink bowel prep.  She is agreeable.  Will place her on clear liquids for the remainder of the day and plan to prep for possible colonoscopy and EGD on 7/18.  Acelyn Basham D.  06/13/2017, 10:08 AM  Pager number [phone]

## 2017-06-14 NOTE — Anesthesia Procedure Notes (Signed)
Procedure Name: MAC Date/Time: 06/14/2017 11:13 AM Performed by: Ofilia Neas Pre-anesthesia Checklist: Patient identified, Emergency Drugs available, Suction available, Timeout performed and Patient being monitored Patient Re-evaluated:Patient Re-evaluated prior to induction Oxygen Delivery Method: Nasal cannula

## 2017-06-14 NOTE — Progress Notes (Signed)
INFECTIOUS DISEASE PROGRESS NOTE  ID: Shannon Schmidt is a 76 y.o. female with  Principal Problem:   Sepsis ([place]) Active Problems:   Diabetes mellitus ([place])   Decubitus ulcers   HTN (hypertension)   Anemia   HCAP (healthcare-associated pneumonia)   Osteomyelitis ([facility])   Oral thrush   Severe malnutrition ([facility])   Hypocalcemia   History of colonic polyps  Subjective: Without complaints.   Abtx:  Anti-infectives    Start     Dose/Rate Route Frequency Ordered Stop   06/16/17 1000  ceFEPIme (MAXIPIME) 2 g in dextrose 5 % 50 mL IVPB  Status:  Discontinued     2 g 100 mL/hr over 30 Minutes Intravenous Every 12 hours 06/14/17 0957 06/14/17 1327   06/15/17 1000  ceFEPIme (MAXIPIME) 2 g in dextrose 5 % 50 mL IVPB     2 g 100 mL/hr over 30 Minutes Intravenous Every 12 hours 06/14/17 1327     06/14/17 1600  piperacillin-tazobactam (ZOSYN) IVPB 3.375 g  Status:  Discontinued     3.375 g 12.5 mL/hr over 240 Minutes Intravenous Every 8 hours 06/14/17 0957 06/14/17 1327   06/14/17 1600  piperacillin-tazobactam (ZOSYN) IVPB 3.375 g     3.375 g 12.5 mL/hr over 240 Minutes Intravenous Every 8 hours 06/14/17 1327 06/15/17 0759   06/09/17 0600  vancomycin (VANCOCIN) IVPB 1000 mg/200 mL premix     1,000 mg 200 mL/hr over 60 Minutes Intravenous Every 12 hours 06/08/17 2132     06/09/17 0500  ceFEPIme (MAXIPIME) 2 g in dextrose 5 % 50 mL IVPB  Status:  Discontinued     2 g 100 mL/hr over 30 Minutes Intravenous Every 12 hours 06/08/17 2132 06/08/17 2223   06/08/17 2359  piperacillin-tazobactam (ZOSYN) IVPB 3.375 g  Status:  Discontinued     3.375 g 12.5 mL/hr over 240 Minutes Intravenous Every 8 hours 06/08/17 2226 06/14/17 0957   06/08/17 2230  fluconazole (DIFLUCAN) tablet 200 mg  Status:  Discontinued     200 mg Oral Daily 06/08/17 2223 06/13/17 1710   06/08/17 2200  metroNIDAZOLE (FLAGYL) IVPB 500 mg  Status:  Discontinued     500 mg 100 mL/hr over 60 Minutes Intravenous Every 8 hours  06/08/17 2141 06/09/17 0820   06/08/17 1600  ceFEPIme (MAXIPIME) 2 g in dextrose 5 % 50 mL IVPB     2 g 100 mL/hr over 30 Minutes Intravenous  Once 06/08/17 1558 06/09/17 0824   06/08/17 1600  vancomycin (VANCOCIN) IVPB 1000 mg/200 mL premix     1,000 mg 200 mL/hr over 60 Minutes Intravenous  Once 06/08/17 1558 06/09/17 0825      Medications:  Scheduled: . chlorhexidine  15 mL Mouth/Throat BID  . Chlorhexidine Gluconate Cloth  6 each Topical Daily  . collagenase   Topical Daily  . divalproex  250 mg Oral QHS  . enoxaparin (LOVENOX) injection  40 mg Subcutaneous Q24H  . feeding supplement (PRO-STAT SUGAR FREE 64)  30 mL Oral BID  . ferrous sulfate  325 mg Oral BID WC  . furosemide  20 mg Oral Daily  . gabapentin  300 mg Oral TID  . insulin aspart  0-15 Units Subcutaneous TID WC  . insulin detemir  10 Units Subcutaneous Daily   And  . insulin detemir  10 Units Subcutaneous QHS  . ipratropium-albuterol  3 mL Nebulization BID  . latanoprost  1 drop Both Eyes QHS  . loratadine  10 mg Oral q morning -  10a  . metoprolol succinate  25 mg Oral Daily  . oxycodone  30 mg Oral Q6H  . pantoprazole  40 mg Oral Daily  . potassium chloride  40 mEq Oral Q4H  . predniSONE  10 mg Oral Q breakfast  . protein supplement shake  11 oz Oral BID BM  . risperiDONE  1.5 mg Oral QHS  . simvastatin  10 mg Oral QHS  . tiZANidine  2 mg Oral BID    Objective: Vital signs in last 24 hours: Temp:  [98 F (36.7 C)-99 F (37.2 C)] 98.3 F (36.8 C) (07/18 1430) Pulse Rate:  [82-109] 84 (07/18 1430) Resp:  [12-20] 16 (07/18 1430) BP: (137-182)/(69-101) 169/75 (07/18 1430) SpO2:  [92 %-98 %] 95 % (07/18 1430)   General appearance: alert, cooperative and no distress Resp: clear to auscultation bilaterally Cardio: regular rate and rhythm GI: normal findings: bowel sounds normal and soft, non-tender Extremities: edema trace Incision/Wound: R great toe nail falls off. There is no d/c from her toe wound  dorsally.   Lab Results  Recent Labs  06/13/17 0415 06/14/17 0904  WBC 10.1 18.7*  HGB 6.6* 10.4*  HCT 21.0* 33.1*  NA 138 143  K 4.1 3.0*  CL 98* 93*  CO2 33* 39*  BUN 10 6  CREATININE 0.69 0.56   Liver Panel  Recent Labs  06/13/17 0415 06/14/17 0904  PROT 5.9* 7.1  ALBUMIN 1.8* 2.2*  AST 15 26  ALT 17 24  ALKPHOS 77 79  BILITOT 0.2* 0.3   Sedimentation Rate  Recent Labs  06/13/17 0415  ESRSEDRATE 85*   C-Reactive Protein  Recent Labs  06/13/17 0415  CRP 8.2*    Microbiology: Recent Results (from the past 240 hour(s))  Blood Culture (routine x 2)     Status: None   Collection Time: 06/08/17  4:17 PM  Result Value Ref Range Status   Specimen Description BLOOD  Final   Special Requests   Final    BOTTLES DRAWN AEROBIC AND ANAEROBIC Blood Culture adequate volume   Culture   Final    NO GROWTH 5 DAYS Performed at [facility], [address], [place], [place] [address]    Report Status 06/13/2017 FINAL  Final  Blood Culture (routine x 2)     Status: None   Collection Time: 06/08/17  4:45 PM  Result Value Ref Range Status   Specimen Description BLOOD BLOOD RIGHT HAND  Final   Special Requests IN PEDIATRIC BOTTLE Blood Culture adequate volume  Final   Culture   Final    NO GROWTH 5 DAYS Performed at [facility], [place] [address], [place], [place] [address]    Report Status 06/13/2017 FINAL  Final  Urine culture     Status: None   Collection Time: 06/08/17  6:23 PM  Result Value Ref Range Status   Specimen Description URINE, CATHETERIZED  Final   Special Requests Normal  Final   Culture   Final    NO GROWTH Performed at [facility], [place] [address], [place], [place] [address]    Report Status 06/10/2017 FINAL  Final  MRSA PCR Screening     Status: None   Collection Time: 06/08/17  9:36 PM  Result Value Ref Range Status   MRSA by PCR NEGATIVE NEGATIVE Final    Comment:        The GeneXpert MRSA Assay (FDA approved for  NASAL specimens only), is one component of  a comprehensive MRSA colonization surveillance program. It is not intended to diagnose MRSA infection nor to guide or monitor treatment for MRSA infections.   Culture, sputum-assessment     Status: None   Collection Time: 06/09/17  3:59 AM  Result Value Ref Range Status   Specimen Description SPU  Final   Special Requests Normal  Final   Sputum evaluation THIS SPECIMEN IS ACCEPTABLE FOR SPUTUM CULTURE  Final   Report Status 06/09/2017 FINAL  Final  Culture, respiratory (NON-Expectorated)     Status: None   Collection Time: 06/09/17  3:59 AM  Result Value Ref Range Status   Specimen Description SPU  Final   Special Requests Normal Reflexed from [id number]  Final   Gram Stain   Final    MODERATE WBC PRESENT,BOTH PMN AND MONONUCLEAR RARE SQUAMOUS EPITHELIAL CELLS PRESENT RARE YEAST Performed at [facility], [place] [address], [place], [place] [address]    Culture FEW CANDIDA ALBICANS  Final   Report Status 06/11/2017 FINAL  Final    Studies/Results: No results found.   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   Total days of antibiotics: 5 vanco/zosyn  Change zosyn to ceftriaxone/flagyl at day (on 7-20) Continue vancomycin for total of 42 days Will write for OPAT consult.  Available as needed, glad to see as outpt as needed.  --------------------------------------------------------------------------------------------------------------------------------------------- Culture Result: none  Allergies  Allergen Reactions  . Ace Inhibitors Other (See Comments)    Unknown   . Chlorpromazine Other (See Comments)    Unknown  . Haloperidol Decanoate Other (See Comments)     Unknown    OPAT Orders Discharge antibiotics: ceftriaxone 2g IVPB qday, flagyl 521m po q8h Per pharmacy protocol vancomycin Aim for Vancomycin trough 15-20 (unless otherwise indicated) Duration: 42 days total End Date: 07-23-17  [facility]:  Labs weekly while on IV antibiotics: x__ CBC with differential __ BMP _x_ CMP _x_ CRP _x_ ESR x__ Vancomycin trough  __x Please pull PIC at completion of IV antibiotics  Fax weekly labs to [phone] Clinic Follow Up Appt: Danamarie Minami 4-5 weeks          JBobby RumpfInfectious Diseases (pager) ([phone] 06/14/2017, 3:37 PM  LOS: 6 days

## 2017-06-14 NOTE — Anesthesia Preprocedure Evaluation (Signed)
Anesthesia Evaluation  Patient identified by MRN, date of birth, ID band Patient awake    Reviewed: Allergy & Precautions, H&P , NPO status , Patient's Chart, lab work & pertinent test results  History of Anesthesia Complications (+) PONV and history of anesthetic complications  Airway Mallampati: II   Neck ROM: full    Dental   Pulmonary pneumonia, COPD, former smoker,    breath sounds clear to auscultation       Cardiovascular hypertension,  Rhythm:regular Rate:Normal     Neuro/Psych PSYCHIATRIC DISORDERS Depression Bipolar Disorder Schizophrenia paraplegia    GI/Hepatic hiatal hernia, GERD  ,  Endo/Other  diabetes, Type 2  Renal/GU      Musculoskeletal  (+) Arthritis ,   Abdominal   Peds  Hematology  (+) anemia ,   Anesthesia Other Findings   Reproductive/Obstetrics                             Anesthesia Physical Anesthesia Plan  ASA: III  Anesthesia Plan: MAC   Post-op Pain Management:    Induction: Intravenous  PONV Risk Score and Plan: 3 and Ondansetron, Dexamethasone, Propofol, Midazolam and Treatment may vary due to age or medical condition  Airway Management Planned: Simple Face Mask  Additional Equipment:   Intra-op Plan:   Post-operative Plan:   Informed Consent: I have reviewed the patients History and Physical, chart, labs and discussed the procedure including the risks, benefits and alternatives for the proposed anesthesia with the patient or authorized representative who has indicated his/her understanding and acceptance.     Plan Discussed with: CRNA and Anesthesiologist  Anesthesia Plan Comments:         Anesthesia Quick Evaluation

## 2017-06-14 NOTE — Op Note (Addendum)
[facility] Patient Name: Shannon Schmidt Procedure Date: 06/14/2017 MRN: [id number] Attending MD: Carlota Raspberry. Aranza Geddes MD, MD Date of Birth: 1941/01/28 CSN: [id number] Age: 76 Admit Type: Inpatient Procedure:                Colonoscopy Indications:              anemia of unclear etiology, history of colon                            polyps, history of sacral wounds with diverting                            colostomy and remnant rectal pouch Providers:                Remo Lipps P. Nikitha Mode MD, MD, Laverta Baltimore RN,                            RN, Elspeth Cho Tech., Technician, Dion Saucier,                            CRNA Referring MD:              Medicines:                Monitored Anesthesia Care Complications:            No immediate complications. Estimated blood loss:                            Minimal. Estimated Blood Loss:     Estimated blood loss was minimal. Procedure:                Pre-Anesthesia Assessment:                           - Prior to the procedure, a History and Physical                            was performed, and patient medications and                            allergies were reviewed. The patient's tolerance of                            previous anesthesia was also reviewed. The risks                            and benefits of the procedure and the sedation                            options and risks were discussed with the patient.                            All questions were answered, and informed consent                            was obtained.  Prior Anticoagulants: The patient has                            taken no previous anticoagulant or antiplatelet                            agents. ASA Grade Assessment: III - A patient with                            severe systemic disease. After reviewing the risks                            and benefits, the patient was deemed in                            satisfactory condition to undergo  the procedure.                           After obtaining informed consent, the colonoscope                            was passed under direct vision. Throughout the                            procedure, the patient's blood pressure, pulse, and                            oxygen saturations were monitored continuously. The                            EC-[id number] ([id number]) scope was introduced through                            the sigmoid colostomy and advanced to the the                            cecum, identified by appendiceal orifice and                            ileocecal valve. The colonoscopy was technically                            difficult and complex due to post-surgical anatomy.                            The patient tolerated the procedure well. The                            quality of the bowel preparation was adequate. The                            ileocecal valve, appendiceal orifice, and rectum  were photographed. Scope In: 12:27:45 PM Scope Out: 12:55:40 PM Scope Withdrawal Time: 0 hours 15 minutes 43 seconds  Total Procedure Duration: 0 hours 27 minutes 55 seconds  Findings:      The perianal and digital rectal examinations were normal. Sacral wounds       noted.      A 6 mm polyp was found in the ascending colon. The polyp was sessile.       The polyp was removed with a cold snare. Resection and retrieval were       complete.      Three sessile polyps were found in the transverse colon. The polyps were       5 to 6 mm in size. These polyps were removed with a cold snare.       Resection and retrieval were complete.      A few small-mouthed diverticula were found in the left colon.      The exam was otherwise without abnormality. Following the exam the       patient was rolled on her side, the rectal pouch was examined. There was       residual mucous in the pouch which impaired visualization, but no       pathology otherwise noted. Of  note, the procedure was prolonged as the       patient did not retain air well at the ostomy and the sigmoid was       difficult to navigate. Impression:               - One 6 mm polyp in the ascending colon, removed                            with a cold snare. Resected and retrieved.                           - Three 5 to 6 mm polyps in the transverse colon,                            removed with a cold snare. Resected and retrieved.                           - Diverticulosis in the left colon.                           - Residual mucous in the rectal pouch with poor                            visualization in the pouch, it otherwise appeared                            normal.                           - The examination was otherwise normal.                           No evidence for cause of anemia on colonoscopy. Moderate Sedation:      No moderate sedation, case performed with MAC Recommendation:           -  Return patient to hospital ward for ongoing care.                           - Resume previous diet.                           - Continue present medications.                           - Await pathology results. Procedure Code(s):        --- Professional ---                           [phone], Colonoscopy through stoma; with removal of                            tumor(s), polyp(s), or other lesion(s) by snare                            technique Diagnosis Code(s):        --- Professional ---                           D12.2, Benign neoplasm of ascending colon                           D12.3, Benign neoplasm of transverse colon (hepatic                            flexure or splenic flexure)                           K57.30, Diverticulosis of large intestine without                            perforation or abscess without bleeding CPT copyright 2016 American Medical Association. All rights reserved. The codes documented in this report are preliminary and upon coder review may  be  revised to meet current compliance requirements. Remo Lipps P. Ambry Dix MD, MD 06/14/2017 1:06:39 PM This report has been signed electronically. Number of Addenda: 0

## 2017-06-14 NOTE — Anesthesia Procedure Notes (Signed)
Performed by: Dawnya Grams E       

## 2017-06-14 NOTE — Transfer of Care (Signed)
Immediate Anesthesia Transfer of Care Note  Patient: [name]  Procedure(s) Performed: Procedure(s): ESOPHAGOGASTRODUODENOSCOPY (EGD) WITH PROPOFOL (N/A) COLONOSCOPY WITH PROPOFOL (N/A)  Patient Location: PACU and Endoscopy Unit  Anesthesia Type:MAC  Level of Consciousness: awake, oriented, patient cooperative and responds to stimulation  Airway & Oxygen Therapy: Patient Spontanous Breathing and Patient connected to nasal cannula oxygen  Post-op Assessment: Report given to RN, Post -op Vital signs reviewed and stable and Patient moving all extremities  Post vital signs: Reviewed and stable  Last Vitals:  Vitals:   06/14/17 0650 06/14/17 0848  BP: (!) 182/92   Pulse: (!) 109 85  Resp: 20 18  Temp: 36.8 C     Last Pain:  Vitals:   06/14/17 0650  TempSrc: Oral  PainSc:       Patients Stated Pain Goal: 3 (60/45/40 9811)  Complications: No apparent anesthesia complications

## 2017-06-14 NOTE — Interval H&P Note (Signed)
History and Physical Interval Note:  06/14/2017 11:26 AM  Shannon Schmidt  has presented today for surgery, with the diagnosis of Anemia  The various methods of treatment have been discussed with the patient and family. After consideration of risks, benefits and other options for treatment, the patient has consented to  Procedure(s): ESOPHAGOGASTRODUODENOSCOPY (EGD) WITH PROPOFOL (N/A) COLONOSCOPY WITH PROPOFOL (N/A) as a surgical intervention .  The patient's history has been reviewed, patient examined, no change in status, stable for surgery.  I have reviewed the patient's chart and labs.  Questions were answered to the patient's satisfaction.     [name]

## 2017-06-14 NOTE — Op Note (Signed)
[facility] Patient Name: Shannon Schmidt Procedure Date: 06/14/2017 MRN: [id number] Attending MD: Carlota Raspberry. Armbruster MD, MD Date of Birth: Oct 15, 1941 CSN: [id number] Age: 76 Admit Type: Inpatient Procedure:                Upper GI endoscopy Indications:              anemia of unclear etiology Providers:                Remo Lipps P. Armbruster MD, MD, Laverta Baltimore RN,                            RN, Elspeth Cho Tech., Technician, Dion Saucier,                            CRNA Referring MD:              Medicines:                Monitored Anesthesia Care Complications:            No immediate complications. Estimated blood loss:                            Minimal. Estimated Blood Loss:     Estimated blood loss was minimal. Procedure:                Pre-Anesthesia Assessment:                           - Prior to the procedure, a History and Physical                            was performed, and patient medications and                            allergies were reviewed. The patient's tolerance of                            previous anesthesia was also reviewed. The risks                            and benefits of the procedure and the sedation                            options and risks were discussed with the patient.                            All questions were answered, and informed consent                            was obtained. Prior Anticoagulants: The patient has                            taken no previous anticoagulant or antiplatelet                            agents. ASA  Grade Assessment: III - A patient with                            severe systemic disease. After reviewing the risks                            and benefits, the patient was deemed in                            satisfactory condition to undergo the procedure.                           After obtaining informed consent, the endoscope was                            passed under direct vision.  Throughout the                            procedure, the patient's blood pressure, pulse, and                            oxygen saturations were monitored continuously. The                            [id number] ([id number]) scope was introduced through the                            mouth, and advanced to the second part of duodenum.                            The upper GI endoscopy was accomplished without                            difficulty. The patient tolerated the procedure                            well. Scope In: Scope Out: Findings:      The Z-line was regular.      A large paraesophageal hernia was present on retroflexion of the stomach.      The exam of the esophagus was otherwise normal.      Diffuse mild inflammation characterized by erythema and granularity was       found in the gastric body. Adherent mucous was present throughout the       gastric body, it was difficult to wash off but no ulcers noted. Biopsies       were taken with a cold forceps for Helicobacter pylori testing.      The exam of the stomach was otherwise normal.      A single small angiodysplastic lesion without bleeding was found in the       second portion of the duodenum. Fulguration to ablate the lesion to       prevent bleeding by argon plasma was successful.      The exam of the duodenum was otherwise normal. Impression:               -  Z-line regular.                           - Large paraesophageal hernia.                           - Gastritis. Biopsied.                           - Normal stomach otherwise                           - A single non-bleeding angiodysplastic lesion in                            the duodenum. Treated with argon plasma coagulation                            (APC).                           - Normal duodenum otherwise                           Overall, I don't think the AVM is driving her                            anemia, but I ablated it to prevent future                             bleeding. It's possible she has other AVMs in the                            small bowel. Mild gastritis noted othewise,                            biopsies obtained. Anemia could mostly be driven by                            chronic disease based on workup to date. Moderate Sedation:      No moderate sedation, case performed with MAC Recommendation:           - Return patient to hospital ward for ongoing care.                           - Resume previous diet.                           - Continue present medications.                           - Await pathology results. Procedure Code(s):        --- Professional ---                           [phone], Esophagogastroduodenoscopy, flexible,  transoral; with biopsy, single or multiple Diagnosis Code(s):        --- Professional ---                           K44.9, Diaphragmatic hernia without obstruction or                            gangrene                           K29.70, Gastritis, unspecified, without bleeding                           K31.819, Angiodysplasia of stomach and duodenum                            without bleeding CPT copyright 2016 American Medical Association. All rights reserved. The codes documented in this report are preliminary and upon coder review may  be revised to meet current compliance requirements. Remo Lipps P. Armbruster MD, MD 06/14/2017 1:12:52 PM This report has been signed electronically. Number of Addenda: 0

## 2017-06-15 ENCOUNTER — Encounter (HOSPITAL_COMMUNITY): Payer: Self-pay | Admitting: Gastroenterology

## 2017-06-15 LAB — CBC WITH DIFFERENTIAL/PLATELET
BASOS PCT: 0 %
Basophils Absolute: 0 10*3/uL (ref 0.0–0.1)
EOS ABS: 0.1 10*3/uL (ref 0.0–0.7)
Eosinophils Relative: 1 %
HCT: 21 % — ABNORMAL LOW (ref 36.0–46.0)
Hemoglobin: 6.6 g/dL — CL (ref 12.0–15.0)
LYMPHS ABS: 1.9 10*3/uL (ref 0.7–4.0)
Lymphocytes Relative: 19 %
MCH: 24.1 pg — AB (ref 26.0–34.0)
MCHC: 31.4 g/dL (ref 30.0–36.0)
MCV: 76.6 fL — ABNORMAL LOW (ref 78.0–100.0)
MONO ABS: 0.7 10*3/uL (ref 0.1–1.0)
Monocytes Relative: 7 %
NEUTROS ABS: 7.4 10*3/uL (ref 1.7–7.7)
Neutrophils Relative %: 73 %
PLATELETS: 348 10*3/uL (ref 150–400)
RBC: 2.74 MIL/uL — ABNORMAL LOW (ref 3.87–5.11)
RDW: 22.5 % — AB (ref 11.5–15.5)
WBC: 10.1 10*3/uL (ref 4.0–10.5)

## 2017-06-15 LAB — GLUCOSE, CAPILLARY
Glucose-Capillary: 304 mg/dL — ABNORMAL HIGH (ref 65–99)
Glucose-Capillary: 375 mg/dL — ABNORMAL HIGH (ref 65–99)

## 2017-06-15 LAB — FOLATE RBC
Folate, Hemolysate: 503.5 ng/mL
Folate, RBC: 1464 ng/mL (ref >498)
HEMATOCRIT: 34.4 % (ref 34.0–46.6)

## 2017-06-15 LAB — HAPTOGLOBIN: Haptoglobin: 315 mg/dL — ABNORMAL HIGH (ref 34–200)

## 2017-06-15 MED ORDER — METOPROLOL SUCCINATE ER 50 MG PO TB24
50.0000 mg | ORAL_TABLET | Freq: Every day | ORAL | Status: DC
  Administered 2017-06-15: 50 mg via ORAL
  Filled 2017-06-15: qty 1

## 2017-06-15 MED ORDER — PREDNISONE 10 MG PO TABS: 10.0000 mg | ORAL_TABLET | Freq: Every day | ORAL | 0 refills | Status: AC

## 2017-06-15 MED ORDER — METRONIDAZOLE 500 MG PO TABS: 500.0000 mg | ORAL_TABLET | Freq: Three times a day (TID) | ORAL | 0 refills | Status: AC

## 2017-06-15 MED ORDER — CEFTRIAXONE IV (FOR PTA / DISCHARGE USE ONLY): 2.0000 g | INTRAVENOUS | 0 refills | Status: AC

## 2017-06-15 MED ORDER — VANCOMYCIN IV (FOR PTA / DISCHARGE USE ONLY): 1000.0000 mg | Freq: Two times a day (BID) | INTRAVENOUS | 0 refills | Status: AC

## 2017-06-15 NOTE — Anesthesia Postprocedure Evaluation (Signed)
Anesthesia Post Note  Patient: Shannon Schmidt  Procedure(s) Performed: Procedure(s) (LRB): ESOPHAGOGASTRODUODENOSCOPY (EGD) WITH PROPOFOL (N/A) COLONOSCOPY WITH PROPOFOL (N/A)     Patient location during evaluation: PACU Anesthesia Type: MAC Level of consciousness: awake and alert Pain management: pain level controlled Vital Signs Assessment: post-procedure vital signs reviewed and stable Respiratory status: spontaneous breathing, nonlabored ventilation, respiratory function stable and patient connected to nasal cannula oxygen Cardiovascular status: stable and blood pressure returned to baseline Anesthetic complications: no    Last Vitals:  Vitals:   06/14/17 2155 06/15/17 0554  BP: (!) 161/56 (!) 162/74  Pulse: (!) 105 96  Resp: 20 18  Temp: 37 C 36.6 C    Last Pain:  Vitals:   06/15/17 0554  TempSrc: Oral  PainSc:                  [place]

## 2017-06-15 NOTE — Discharge Summary (Addendum)
Physician Discharge Summary  Shannon Schmidt OEV:[id number] DOB: 07-15-1941 DOA: 06/08/2017  PCP: Theressa Millard, MD  Admit date: 06/08/2017 Discharge date: 06/15/2017  Admitted From: Nursing Home Disposition: Nursing home   Recommendations for Outpatient Follow-up:  1. Follow up with nursing home provider at earliest convenience  2. Please obtain BMP/CBC/ESR and vancomycin trough weekly till the patient is on antibiotics   Home Health: No Equipment/Devices: None  Discharge Condition: Guarded CODE STATUS: DO NOT RESUSCITATE Diet recommendation: Heart Healthy / Carb Modified   Brief/Interim Summary: 76 y.o female with history of COPD, diabetes mellitus type 2, chronic feet osteomyelitis, chronic bedridden status, chronic decubitus ulcers and recent history of pneumonia who is been on various antibiotics including cefepime and vancomycin for a week several weeks ago and more recently was on Levaquin from 06/04/2017 to be scheduled to 06/14/2017 along with prednisone taper started on 05/21/2017 for 20 days was sent from nursing home for evaluation of hemoglobin 6.3 and need for blood transfusion. She received 2 units of packed red cells. She was also found to have new right upper lobe infiltrate and right foot x-ray showed 1 destruction of the medial aspect of distal phalanx of the right great toe. She is on broad-spectrum antibiotics. MRI confirmed osteomyelitis. Orthopedics recommended medical management for now and outpatient followup. Hemoglobin was 6.6 on 06/13/2017 so she received 1 more unit of packed red cells. GI was consulted who performed EGD and colonoscopy on 06/14/2017. ID has also evaluated the patient recommends 6 weeks of antibiotics.  Discharge Diagnoses:  Principal Problem:   Sepsis ([place]) Active Problems:   Diabetes mellitus ([place])   Decubitus ulcers   HTN (hypertension)   Anemia   HCAP (healthcare-associated pneumonia)   Osteomyelitis ([facility])   Oral thrush   Severe  malnutrition ([facility])   Hypocalcemia   History of colonic polyps  1. Symptomatic multifactorial anemia. SPblood transfusion, 3 units prbc since admission - Hemoglobin was 6.6 on 06/13/2017; hemoglobin on 06/14/2017 was 10.4. Hemoglobin from this morning is pending. - GI evaluation appreciated.She had EGD and colonoscopy on 06/14/2017. EGD showed gastritis with large paraesophageal hernia and a single nonbleeding angiospastic lesion in the duodenum treated with argon plasma coagulation. Colonoscopy showed polyps in the ascending and transverse colon colon which were removed along with diverticulosis in the left colon. - Outpatient follow-up with GI   2. Right upper lobe pneumonia, present on admission - Status post treatment with Zosyn and vancomycin. Cultures negative so far. Respiratory Status stable. Continue oxygen supplementation via nasal cannula. Finish 7 day of antibiotic treatment. -Aspiration precautions. Diet as per SLP recommendations - Taper prednisone and stop in the next 5 days - Sputum culture had shown some rare yeast; she was on Diflucan which was discontinued on 06/13/2017 by ID  3. Rightfoot osteomyelitis. - Patient probably has chronic osteomyelitis of both feet because of her chronic bedridden status. It is unclear if the right foot osteomyelitis is acute onchronic. Currently on broad-spectrum antibiotics Zosyn and vancomycin - MRI of the right foot is showing early osteomyelitis of the right great toe. - Orthopedics recommends medical management and outpatient follow-up with Dr. Sharol Given -ID recommendsto continue vancomycin for 6 weeks; patient has received almost a week of Zosyn; I did recommends Rocephin 2 g IV daily and Flagyl 500 mg by mouth 3 times a day to finish total of 6 weeks course of therapy. Weekly CBC, ESR, CRP, BMP, vancomycin trough. Outpatient follow-up with ID   4. Multiple stage IVdecubitus ulcers present  on admission. Continue local wound careand  follow recommendations from wound care nurse.  5. Type 2 diabetes mellitus. - Continue sliding scale insulin along with long-acting and short-acting insulin.  6. Hypertension. Continue with Furosemide and metoprolol succinate. Blood pressure on the higher side  7. Depression. Continue risperidone and depakote. No agitation, continue neuro checks per unit protocol.   8. Bedridden status - Fall precautions.  9. Hypoalbuminemia - Follow diet as per nutrition recommendations  10. Sepsis, present on admission: Resolved  Discharge Instructions  Discharge Instructions    Bed rest    Complete by:  As directed    Call MD for:  difficulty breathing, headache or visual disturbances    Complete by:  As directed    Call MD for:  extreme fatigue    Complete by:  As directed    Call MD for:  hives    Complete by:  As directed    Call MD for:  persistant dizziness or light-headedness    Complete by:  As directed    Call MD for:  persistant nausea and vomiting    Complete by:  As directed    Call MD for:  severe uncontrolled pain    Complete by:  As directed    Call MD for:  temperature >100.4    Complete by:  As directed    Diet - low sodium heart healthy    Complete by:  As directed    Diet Carb Modified    Complete by:  As directed    Discharge instructions    Complete by:  As directed    Fall precautions   Home infusion instructions [facility] May follow [place] Dosing Protocol; May administer Cathflo as needed to maintain patency of vascular access device.; Flushing of vascular access device: per [facility] Protocol: 0.9% NaCl pre/post medica...    Complete by:  As directed    Instructions:  May follow [place] Dosing Protocol   Instructions:  May administer Cathflo as needed to maintain patency of vascular access device.   Instructions:  Flushing of vascular access device: per [facility] Protocol: 0.9% NaCl pre/post medication administration and prn patency; Heparin 100  u/ml, 92m for implanted ports and Heparin 10u/ml, 533mfor all other central venous catheters.   Instructions:  May follow [facility] Anaphylaxis Protocol for First Dose Administration in the home: 0.9% NaCl at 25-50 ml/hr to maintain IV access for protocol meds. Epinephrine 0.3 ml IV/IM PRN and Benadryl 25-50 IV/IM PRN s/s of anaphylaxis.   Instructions:  [facility] Coordinator (RN) to assist per patient IV care needs in the home PRN.     Allergies as of 06/15/2017      Reactions   Ace Inhibitors Other (See Comments)   Unknown   Chlorpromazine Other (See Comments)   Unknown   Haloperidol Decanoate Other (See Comments)   Unknown      Medication List    STOP taking these medications   levofloxacin 750 MG tablet Commonly known as:  LEVAQUIN     TAKE these medications   albuterol (2.5 MG/3ML) 0.083% nebulizer solution Commonly known as:  PROVENTIL Take 2.5 mg by nebulization every 6 (six) hours as needed for wheezing or shortness of breath.   cefTRIAXone IVPB Commonly known as:  ROCEPHIN Inject 2 g into the vein daily. Indication:  Osteomyelitis  Last Day of Therapy:  07/23/17 Labs - Once weekly:  CBC/D, CMP, CRP, ESR   diphenhydrAMINE 25 mg capsule Commonly  known as:  BENADRYL Take 25 mg by mouth every 8 (eight) hours as needed for itching. For itching and/or swelling.   divalproex 250 MG 24 hr tablet Commonly known as:  DEPAKOTE ER Take 250 mg by mouth at bedtime.   ferrous sulfate 325 (65 FE) MG tablet Take 325 mg by mouth 2 (two) times daily with a meal.   furosemide 20 MG tablet Commonly known as:  LASIX Take 20 mg by mouth daily.   gabapentin 300 MG capsule Commonly known as:  NEURONTIN Take 300 mg by mouth 3 (three) times daily.   insulin detemir 100 UNIT/ML injection Commonly known as:  LEVEMIR Inject 26-55 Units into the skin 2 (two) times daily. Inject 24units at 9a and 55 units at 5pm   insulin lispro 100 UNIT/ML injection Commonly known as:   HUMALOG Inject 0-10 Units into the skin 2 (two) times daily. 61-150 0 units 151-200 2 units 201-250 4 units 251-300 6 units 301-350 8 units 351-400 10 units   ipratropium-albuterol 0.5-2.5 (3) MG/3ML Soln Commonly known as:  DUONEB Take 3 mLs by nebulization 4 (four) times daily.   latanoprost 0.005 % ophthalmic solution Commonly known as:  XALATAN Place 1 drop into both eyes at bedtime.   loratadine 10 MG tablet Commonly known as:  CLARITIN Take 10 mg by mouth every morning.   magnesium hydroxide 400 MG/5ML suspension Commonly known as:  MILK OF MAGNESIA Take 30 mLs by mouth daily as needed for mild constipation.   MAPAP 325 MG tablet Generic drug:  acetaminophen Take 650 mg by mouth every 8 (eight) hours as needed for mild pain or fever. For pain or fever.   metFORMIN 500 MG tablet Commonly known as:  GLUCOPHAGE Take 500 mg by mouth daily with breakfast.   metoprolol succinate 25 MG 24 hr tablet Commonly known as:  TOPROL-XL Take 25 mg by mouth daily.   metroNIDAZOLE 500 MG tablet Commonly known as:  FLAGYL Take 1 tablet (500 mg total) by mouth 3 (three) times daily.   MI-ACID GAS RELIEF 80 MG chewable tablet Generic drug:  simethicone Chew 80 mg by mouth every 12 (twelve) hours as needed. For gas.   multivitamin with minerals Tabs tablet Take 1 tablet by mouth daily.   omeprazole 20 MG capsule Commonly known as:  PRILOSEC Take 20 mg by mouth 2 (two) times daily before a meal. For heartburn.   oxycodone 30 MG immediate release tablet Commonly known as:  ROXICODONE Take 30 mg by mouth every 6 (six) hours.   predniSONE 10 MG tablet Commonly known as:  DELTASONE Take 1 tablet (10 mg total) by mouth daily with breakfast. What changed:  medication strength  how much to take  additional instructions   risperiDONE 1 MG tablet Commonly known as:  RISPERDAL Take 1.5 mg by mouth at bedtime.   simvastatin 10 MG tablet Commonly known as:  ZOCOR Take 10 mg by  mouth at bedtime.   sodium hypochlorite 0.125 % Soln Commonly known as:  DAKIN'S 1/4 STRENGTH Irrigate with 1 application as directed every evening. Apply to left sacrum at dressing changes nightly   tiZANidine 2 MG tablet Commonly known as:  ZANAFLEX Take 2 mg by mouth 2 (two) times daily.   vancomycin IVPB Inject 1,000 mg into the vein every 12 (twelve) hours. Indication:  Osteomyelitis Last Day of Therapy:  07/23/17 Labs - Once weekly:  CBC/D, CMP, CRP, ESR, and vancomycin trough   Vitamin D (Ergocalciferol) 50000 units Caps capsule  Commonly known as:  DRISDOL Take 50,000 Units by mouth every 30 (thirty) days.            Home Infusion Instuctions        Start     Ordered   06/15/17 0000  Home infusion instructions [facility] May follow [place] Dosing Protocol; May administer Cathflo as needed to maintain patency of vascular access device.; Flushing of vascular access device: per [facility] Protocol: 0.9% NaCl pre/post medica...    Question Answer Comment  Instructions May follow [place] Dosing Protocol   Instructions May administer Cathflo as needed to maintain patency of vascular access device.   Instructions Flushing of vascular access device: per [facility] Protocol: 0.9% NaCl pre/post medication administration and prn patency; Heparin 100 u/ml, 18m for implanted ports and Heparin 10u/ml, 5107mfor all other central venous catheters.   Instructions May follow [facility] Anaphylaxis Protocol for First Dose Administration in the home: 0.9% NaCl at 25-50 ml/hr to maintain IV access for protocol meds. Epinephrine 0.3 ml IV/IM PRN and Benadryl 25-50 IV/IM PRN s/s of anaphylaxis.   Instructions [facility] Infusion Coordinator (RN) to assist per patient IV care needs in the home PRN.      06/15/17 0936      Contact information for follow-up providers    HaCampbell RichesMD Follow up in 3 week(s).   Specialty:  Infectious Diseases Contact information: [address] [phone]        DuNewt MinionMD Follow up in 2 week(s).   Specialty:  Orthopedic Surgery Contact information: [address] [phone]          Contact information for after-discharge care    [place] Follow up.   Specialty:  Skilled Nursing Facility Contact information: [phone]               Allergies  Allergen Reactions  . Ace Inhibitors Other (See Comments)    Unknown   . Chlorpromazine Other (See Comments)    Unknown  . Haloperidol Decanoate Other (See Comments)    Unknown    Consultations:  Orthopedics/ID/GI   Procedures/Studies: Dg Chest 2 View  Result Date: 06/08/2017 CLINICAL DATA:  Leukocytosis, recent treatment for pneumonia. History of diabetes, paraplegia. EXAM: CHEST  2 VIEW COMPARISON:  Chest radiograph March 09, 2014 FINDINGS: Dense consolidation RIGHT upper lobe. No pleural effusion. Cardiac silhouette is normal in size, calcified aortic knob. Apical pleural capping. No pneumothorax. RIGHT PICC distal tip projects in proximal superior vena cava. Moderate degenerative change of the thoracic spine. IMPRESSION: RIGHT upper lobe pneumonia. Followup PA and lateral chest X-ray is recommended in 3-4 weeks following trial of antibiotic therapy to ensure resolution and exclude underlying malignancy. Electronically Signed   By: CoElon Alas.D.   On: 06/08/2017 17:30   Mr Foot Right W Wo Contrast  Result Date: 06/12/2017 CLINICAL DATA:  Chronic foot infection. Patient gives a history of chronic osteomyelitis. Abnormal radiographs dated 06/08/2017. EXAM: MRI OF THE RIGHT FOREFOOT WITHOUT AND WITH CONTRAST TECHNIQUE: Multiplanar, multisequence MR imaging of the right forefoot was performed before and after the administration of intravenous contrast. CONTRAST:  193mULTIHANCE  GADOBENATE DIMEGLUMINE 529 MG/ML IV SOLN COMPARISON: None. FINDINGS: Bones/Joint/Cartilage There is abnormal enhancement and edema in the medial aspect of the head of the first metatarsal. There  is very subtle abnormal edema and enhancement of the medial aspect of the tuft of the distal phalanx of the great toe. There is no cortical destruction at either site. There is also slight abnormal edema and enhancement of the plantar aspect of the head of the second metatarsal. There are no appreciable overlying soft tissue ulcers. No joint effusions.  The other bones of the forefoot appear normal. Soft tissues Prominent subcutaneous edema on the dorsum of the foot, nonspecific. The muscles appear normal. No abscesses. IMPRESSION: Abnormal edema and enhancement in the heads of the second and first metatarsals with a tiny area of abnormal edema and enhancement in the medial aspect of the tuft of the distal phalanx of the great toe. Findings are consistent with early osteomyelitis. Electronically Signed   By: Lorriane Shire M.D.   On: 06/12/2017 15:45   Dg Foot Complete Left  Result Date: 06/08/2017 CLINICAL DATA:  Black great toe.  History of diabetes, paraplegia. EXAM: LEFT FOOT - COMPLETE 3+ VIEW COMPARISON:  None. FINDINGS: There is no evidence of fracture or dislocation. Thickened fifth metatarsus diaphysis suggests old fracture. Severe osteopenia. There is no evidence of arthropathy or other focal bone abnormality. Soft tissue swelling. Mild vascular calcifications. IMPRESSION: Severe osteopenia without acute osseous process. Soft tissue swelling. Electronically Signed   By: Elon Alas M.D.   On: 06/08/2017 17:34   Dg Foot Complete Right  Addendum Date: 06/09/2017   ADDENDUM REPORT: 06/09/2017 13:24 ADDENDUM: Error in initial impression concerning laterality. Impression should be corrected to state: IMPRESSION: IMPRESSION Suspected bone destruction at the medial aspect, distal phalanx RIGHT great toe  concerning for osteomyelitis. Remainder of report as previously dictated. Electronically Signed   By: Lavonia Dana M.D.   On: 06/09/2017 13:24   Result Date: 06/09/2017 CLINICAL DATA:  Black great toe EXAM: RIGHT FOOT COMPLETE - 3+ VIEW COMPARISON:  None FINDINGS: Marked osseous demineralization. Hallux valgus. Prior resection of the PIP joint second toe. Cortical integrity at the medial aspect tuft of distal phalanx appears lost concerning for osteomyelitis. No acute fracture, dislocation or additional bone destruction. Diffuse soft tissue swelling especially at the dorsum of the foot and anterior ankle. Scattered small vessel vascular calcifications. IMPRESSION: Suspected bone destruction at the medial aspect, tuft of distal phalanx LEFT great toe concerning for osteomyelitis. Electronically Signed: By: Lavonia Dana M.D. On: 06/08/2017 17:32    EGD and colonoscopy on 06/14/2017. EGD showed gastritis with large paraesophageal hernia and a single nonbleeding angiospastic lesion in the duodenum treated with argon plasma coagulation. Colonoscopy showed polyps in the ascending and transverse colon colon which were removed along with diverticulosis in the left colon.    Subjective: Patient seen and examined at bedside. She is awake and denies any overnight fever, nausea, vomiting. She is a poor historian  Discharge Exam: Vitals:   06/14/17 2155 06/15/17 0554  BP: (!) 161/56 (!) 162/74  Pulse: (!) 105 96  Resp: 20 18  Temp: 98.6 F (37 C) 97.9 F (36.6 C)   Vitals:   06/14/17 2052 06/14/17 2155 06/15/17 0554 06/15/17 0734  BP:  (!) 161/56 (!) 162/74   Pulse:  (!) 105 96   Resp:  20 18   Temp:  98.6 F (37 C) 97.9 F (36.6 C)   TempSrc:  Oral Oral   SpO2: 95% 94% 99% 94%  Weight:      Height:        General: Pt is alert, awake, not in acute distress Cardiovascular: Intermittent  tachycardia, S1/S2 + Respiratory: Bilateral decreased breath sounds at bases  Abdominal: Soft, NT, ND, bowel  sounds +; 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     Microbiology: Recent Results (from the past 240 hour(s))  Blood Culture (routine x 2)     Status: None   Collection Time: 06/08/17  4:17 PM  Result Value Ref Range Status   Specimen Description BLOOD  Final   Special Requests   Final    BOTTLES DRAWN AEROBIC AND ANAEROBIC Blood Culture adequate volume   Culture   Final    NO GROWTH 5 DAYS Performed at [facility], [address], [place], [place] [address]    Report Status 06/13/2017 FINAL  Final  Blood Culture (routine x 2)     Status: None   Collection Time: 06/08/17  4:45 PM  Result Value Ref Range Status   Specimen Description BLOOD BLOOD RIGHT HAND  Final   Special Requests IN PEDIATRIC BOTTLE Blood Culture adequate volume  Final   Culture   Final    NO GROWTH 5 DAYS Performed at [facility], [place] [address], [place], [place] [address]    Report Status 06/13/2017 FINAL  Final  Urine culture     Status: None   Collection Time: 06/08/17  6:23 PM  Result Value Ref Range Status   Specimen Description URINE, CATHETERIZED  Final   Special Requests Normal  Final   Culture   Final    NO GROWTH Performed at [facility], [place] [address], [place], [place] [address]    Report Status 06/10/2017 FINAL  Final  MRSA PCR Screening     Status: None   Collection Time: 06/08/17  9:36 PM  Result Value Ref Range Status   MRSA by PCR NEGATIVE NEGATIVE Final    Comment:        The GeneXpert MRSA Assay (FDA approved for NASAL specimens only), is one component of a comprehensive MRSA colonization surveillance program. It is not intended to diagnose MRSA infection nor to guide or monitor treatment  for MRSA infections.   Culture, sputum-assessment     Status: None   Collection Time: 06/09/17  3:59 AM  Result Value Ref Range Status   Specimen Description SPU  Final   Special Requests Normal  Final   Sputum evaluation THIS SPECIMEN IS ACCEPTABLE FOR SPUTUM CULTURE  Final   Report Status 06/09/2017 FINAL  Final  Culture, respiratory (NON-Expectorated)     Status: None   Collection Time: 06/09/17  3:59 AM  Result Value Ref Range Status   Specimen Description SPU  Final   Special Requests Normal Reflexed from [id number]  Final   Gram Stain   Final    MODERATE WBC PRESENT,BOTH PMN AND MONONUCLEAR RARE SQUAMOUS EPITHELIAL CELLS PRESENT RARE YEAST Performed at [facility], [place] [address], [place], [place] [address]    Culture FEW CANDIDA ALBICANS  Final   Report Status 06/11/2017 FINAL  Final     Labs: BNP (last 3 results) No results for input(s): BNP in the last 8760 hours. Basic Metabolic Panel:  Recent Labs Lab 06/09/17 0137 06/09/17 0359 06/10/17 0500 06/11/17 0417 06/12/17 0422 06/13/17 0415 06/14/17 0904  NA  --  137 135 141  --  138 143  K  --  3.8 3.9 4.2  --  4.1 3.0*  CL  --  103 99* 105  --  98* 93*  CO2  --  _0 --  33* 39*  GLUCOSE  --  90 180* 183*  --  213* 113*  BUN  --  _1 --  10 6  CREATININE  --  0.53 0.63 0.64 0.57 0.69 0.56  CALCIUM  --  7.3* 7.6* 7.7*  --  7.9* 8.4*  MG 2.1  --   --   --   --  1.7 1.5*   Liver Function Tests:  Recent Labs Lab 06/08/17 1617 06/13/17 0415 06/14/17 0904  AST 42* 15 26  ALT _2 ALKPHOS 70 77 79  BILITOT 0.8 0.2* 0.3  PROT 7.2 5.9* 7.1  ALBUMIN 2.1* 1.8* 2.2*   No results for input(s): LIPASE, AMYLASE in the last 168 hours. No results for input(s): AMMONIA in the last 168 hours. CBC:  Recent Labs Lab 06/09/17 1524 06/10/17 0500 06/11/17 0417 06/11/17 0900 06/13/17 0415 06/14/17 0904  WBC 15.0* 11.8* 10.9*  --  10.1 18.7*  NEUTROABS 13.9* 9.7* 8.4*  --  7.4 15.0*  HGB  8.1* 8.3* 7.4* 8.4* 6.6* 10.4*  HCT 25.8* 25.8* 23.7* 27.1* 21.0* 33.1*  MCV 73.5* 74.4* 75.7*  --  76.6* 77.3*  PLT 375 406* 380  --  348 514*   Cardiac Enzymes: No results for input(s): CKTOTAL, CKMB, CKMBINDEX, TROPONINI in the last 168 hours. BNP: Invalid input(s): POCBNP CBG:  Recent Labs Lab 06/14/17 0813 06/14/17 1425 06/14/17 1706 06/14/17 2152 06/15/17 0903  GLUCAP 101* 131* 242* 250* 304*   D-Dimer No results for input(s): DDIMER in the last 72 hours. Hgb A1c No results for input(s): HGBA1C in the last 72 hours. Lipid Profile No results for input(s): CHOL, HDL, LDLCALC, TRIG, CHOLHDL, LDLDIRECT in the last 72 hours. Thyroid function studies  Recent Labs  06/14/17 0904  TSH 2.728   Anemia work up  Recent Labs  06/13/17 1121 06/14/17 0904  VITAMINB12  --  772  TIBC 210*  --   IRON 23*  --    Urinalysis    Component Value Date/Time   COLORURINE STRAW (A) 06/08/2017 1552   APPEARANCEUR CLEAR 06/08/2017 1552   LABSPEC 1.002 (L) 06/08/2017 1552   PHURINE 6.0 06/08/2017 1552   GLUCOSEU NEGATIVE 06/08/2017 1552   HGBUR SMALL (A) 06/08/2017 1552   BILIRUBINUR NEGATIVE 06/08/2017 1552   KETONESUR NEGATIVE 06/08/2017 1552   PROTEINUR NEGATIVE 06/08/2017 1552   UROBILINOGEN 0.2 03/09/2014 1932   NITRITE NEGATIVE 06/08/2017 1552   LEUKOCYTESUR TRACE (A) 06/08/2017 1552   Sepsis Labs Invalid input(s): PROCALCITONIN,  WBC,  LACTICIDVEN Microbiology Recent Results (from the past 240 hour(s))  Blood Culture (routine x 2)     Status: None   Collection Time: 06/08/17  4:17 PM  Result Value Ref Range Status   Specimen Description BLOOD  Final   Special Requests   Final    BOTTLES DRAWN AEROBIC AND ANAEROBIC Blood Culture adequate volume   Culture   Final    NO GROWTH 5 DAYS Performed at [facility], [address], [place], [place] [address]    Report Status 06/13/2017 FINAL  Final  Blood Culture (routine x 2)     Status: None   Collection Time:  06/08/17  4:45 PM  Result Value Ref Range Status   Specimen Description  BLOOD BLOOD RIGHT HAND  Final   Special Requests IN PEDIATRIC BOTTLE Blood Culture adequate volume  Final   Culture   Final    NO GROWTH 5 DAYS Performed at [facility], [place] [address], [place], [place] [address]    Report Status 06/13/2017 FINAL  Final  Urine culture     Status: None   Collection Time: 06/08/17  6:23 PM  Result Value Ref Range Status   Specimen Description URINE, CATHETERIZED  Final   Special Requests Normal  Final   Culture   Final    NO GROWTH Performed at [facility], [place] [address], [place], [place] [address]    Report Status 06/10/2017 FINAL  Final  MRSA PCR Screening     Status: None   Collection Time: 06/08/17  9:36 PM  Result Value Ref Range Status   MRSA by PCR NEGATIVE NEGATIVE Final    Comment:        The GeneXpert MRSA Assay (FDA approved for NASAL specimens only), is one component of a comprehensive MRSA colonization surveillance program. It is not intended to diagnose MRSA infection nor to guide or monitor treatment for MRSA infections.   Culture, sputum-assessment     Status: None   Collection Time: 06/09/17  3:59 AM  Result Value Ref Range Status   Specimen Description SPU  Final   Special Requests Normal  Final   Sputum evaluation THIS SPECIMEN IS ACCEPTABLE FOR SPUTUM CULTURE  Final   Report Status 06/09/2017 FINAL  Final  Culture, respiratory (NON-Expectorated)     Status: None   Collection Time: 06/09/17  3:59 AM  Result Value Ref Range Status   Specimen Description SPU  Final   Special Requests Normal Reflexed from [id number]  Final   Gram Stain   Final    MODERATE WBC PRESENT,BOTH PMN AND MONONUCLEAR RARE SQUAMOUS EPITHELIAL CELLS PRESENT RARE YEAST Performed at [facility], [place] [address] [place], [place] [address]    Culture FEW CANDIDA ALBICANS  Final   Report Status 06/11/2017 FINAL  Final     Time coordinating discharge: 35  minutes  SIGNED:   Aline August, MD  [facility] 06/15/2017, 9:37 AM Pager: [phone]  If 7PM-7AM, please contact night-coverage [email or web address] Password [email or web address]

## 2017-06-15 NOTE — Progress Notes (Signed)
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 Large paraesophageal hernia noted and mild gastritis.  4 small polyps in the colon removed and diverticulosis noted. Overall no overt pathology noted to cause her anemia, which I suspect is multifactorial, most likely due to chronic disease. We will relay pathology results to her once available. No further GI workup recommended at this time. We will sign off, please call if questions.  [place] Cellar, MD [facility] Pager [phone]

## 2017-06-15 NOTE — Progress Notes (Addendum)
Patient returning to [employer] [facility]. D/c Summary faxed to facility, confirmed received.  [facility] called for transport.  Nurse given number for report. Transportation packet at [place].    Kathrin Greathouse, Latanya Presser, MSW Clinical Social Worker 5E and Psychiatric Service Line [phone] 06/15/2017  12:36 PM

## 2017-06-16 ENCOUNTER — Encounter: Payer: Self-pay | Admitting: Gastroenterology

## 2017-07-27 ENCOUNTER — Encounter: Payer: Self-pay | Admitting: Internal Medicine

## 2017-07-27 ENCOUNTER — Ambulatory Visit (INDEPENDENT_AMBULATORY_CARE_PROVIDER_SITE_OTHER): Payer: Medicare Other | Admitting: Internal Medicine

## 2017-07-27 DIAGNOSIS — L89000 Pressure ulcer of unspecified elbow, unstageable: Secondary | ICD-10-CM | POA: Diagnosis not present

## 2017-07-27 DIAGNOSIS — M86471 Chronic osteomyelitis with draining sinus, right ankle and foot: Secondary | ICD-10-CM

## 2017-07-27 NOTE — Progress Notes (Signed)
   Subjective:    Patient ID: Shannon Schmidt, female    DOB: 10-15-1941, 76 y.o.   MRN: [id number]  HPI Follow up of chronic osteomyelitis of right great toe.   Treated for chronic osteo based on chronic non healing ulcer and MRI findings.  Completed 6 weeks of vancomycin and ceftriaxone.  Picc line pulled.  Some loose stools on antibiotics.  Just finished yesterday.  No associated rash.  Also with a decubitus ulcer.    Review of Systems  Constitutional: Negative for chills, fatigue and fever.  Gastrointestinal: Negative for diarrhea.  Skin: Negative for rash.       Objective:   Physical Exam  Constitutional: She appears well-developed and well-nourished. No distress.  Eyes: No scleral icterus.  Cardiovascular: Normal rate, regular rhythm and normal heart sounds.   Pulmonary/Chest: Effort normal and breath sounds normal.  Musculoskeletal:  Unable to examine back Right great toe with minimal stage I ulcer, no erythema, no pus          Assessment & Plan:

## 2017-07-27 NOTE — Assessment & Plan Note (Signed)
Treated and now improved ulcer.  Continue wound care. rtc prn

## 2017-07-27 NOTE — Assessment & Plan Note (Signed)
Getting wound care and stable by her report.

## 2018-01-09 ENCOUNTER — Ambulatory Visit (INDEPENDENT_AMBULATORY_CARE_PROVIDER_SITE_OTHER): Payer: Medicare Other

## 2018-01-09 ENCOUNTER — Encounter (INDEPENDENT_AMBULATORY_CARE_PROVIDER_SITE_OTHER): Payer: Self-pay | Admitting: Orthopaedic Surgery

## 2018-01-09 ENCOUNTER — Ambulatory Visit (INDEPENDENT_AMBULATORY_CARE_PROVIDER_SITE_OTHER): Payer: Medicare Other | Admitting: Orthopaedic Surgery

## 2018-01-09 DIAGNOSIS — M25531 Pain in right wrist: Secondary | ICD-10-CM | POA: Insufficient documentation

## 2018-01-09 DIAGNOSIS — M25532 Pain in left wrist: Secondary | ICD-10-CM | POA: Diagnosis not present

## 2018-01-09 NOTE — Progress Notes (Signed)
Office Visit Note   Patient: Shannon Schmidt           Date of Birth: 1941/10/22           MRN: [id number] Visit Date: 01/09/2018              Requested by: Theressa Millard, MD No address on file PCP: Patient, [name]   Assessment & Plan: Visit Diagnoses:  1. Pain in right wrist   2. 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  Because this is been ongoing for quite some time, we feel it is appropriate to get MRI with contrast to assess this soft tissue structure.  She will follow-up with [place] once that is complete.  Follow-Up Instructions: Return in about 10 days (around 01/19/2018) for after MRI.   Orders:  Orders Placed This Encounter  Procedures  . XR Wrist Complete Left  . MR FOREARM RIGHT W WO CONTRAST   No orders of the defined types were placed in this encounter.     Procedures: No procedures performed   Clinical Data: No additional findings.   Subjective: Chief Complaint  Patient presents with  . Right Wrist - Pain    HPI Shannon Schmidt is a pleasant 76 year old new patient who resides at [facility] living who presents our clinic today with recurrent right wrist ganglion cyst.  This initially occurred back in 1997 when she first had it removed by a physician in [place].  It grew back in 99 and she had it removed once again.  Doing well until 2017 when it came back for a third time.  It is progressively enlarged over the past 2 years.  She notes that it turned red approximately 2 weeks ago.  This subsided without these antibiotics.  Minimal pain.  Of note she is a type II diabetic.  Review of Systems as detailed in HPI.  All others reviewed and are negative.   Objective: Vital Signs: There were no vitals taken for this visit.  Physical Exam well-developed well-nourished female no acute distress.  Alert and oriented x3.  Ortho Exam examination of her right wrist reveals a golf ball size cyst to the radial side.  This  is nontender.  No erythema or cellulitis.  Good pulses throughout.  Specialty Comments:  No specialty comments available.  Imaging: Xr Wrist Complete Left  Result Date: 01/09/2018 X-rays of the right wrist reveal a large soft tissue swelling.  Old distal radius and ulnar styloid fractures.    PMFS History: Patient Active Problem List   Diagnosis Date Noted  . Pain in right wrist 01/09/2018  . History of colonic polyps   . Sepsis ([place]) 06/08/2017  . HCAP (healthcare-associated pneumonia) 06/08/2017  . Osteomyelitis ([place]) 06/08/2017  . Oral thrush 06/08/2017  . Severe malnutrition ([place]) 06/08/2017  . Hypocalcemia 06/08/2017  . HTN (hypertension) 03/10/2014  . Leukocytosis, unspecified 03/10/2014  . Anemia 03/10/2014  . Hyponatremia 03/10/2014  . Dyslipidemia 03/10/2014  . Pyelonephritis due to Escherichia coli 03/09/2014  . Ileus ([place]) 03/09/2014  . Diabetes mellitus ([place]) 03/09/2014  . Decubitus ulcers 03/09/2014   Past Medical History:  Diagnosis Date  . Anemia   . Bipolar 1 disorder ([place])   . Colostomy in place [facility])   . COPD (chronic obstructive pulmonary disease) ([place])   . Depression   . Diabetes mellitus   . GERD (gastroesophageal reflux disease)   . H/O hiatal hernia   .  Muscle weakness   . Neuropathy   . Osteomyelitis ([place])   . Paraplegia ([place])   . PONV (postoperative nausea and vomiting)   . RA (rheumatoid arthritis) ([place])   . Schizophrenia ([place])     Family History  Problem Relation Age of Onset  . Hypertension Mother   . Diabetes type II Mother   . Stroke Father   . Cancer Other     Past Surgical History:  Procedure Laterality Date  . ABDOMINAL HYSTERECTOMY    . BREAST SURGERY     L breast abscess  . CATARACT EXTRACTION W/PHACO  05/09/2012   Procedure: CATARACT EXTRACTION PHACO AND INTRAOCULAR LENS PLACEMENT (IOC);  Surgeon: Marylynn Pearson, MD;  Location: [facility];  Service: Ophthalmology;  Laterality: Right;  . COLON SURGERY    . COLONOSCOPY WITH  PROPOFOL N/A 06/14/2017   Procedure: COLONOSCOPY WITH PROPOFOL;  Surgeon: Manus Gunning, MD;  [facility];  Service: Gastroenterology;  Laterality: N/A;  . ESOPHAGOGASTRODUODENOSCOPY (EGD) WITH PROPOFOL N/A 06/14/2017   Procedure: ESOPHAGOGASTRODUODENOSCOPY (EGD) WITH PROPOFOL;  Surgeon: Manus Gunning, MD;  [facility];  Service: Gastroenterology;  Laterality: N/A;  . EYE SURGERY     L cataract  . GANGLION CYST EXCISION     R wrist x2  . HERNIA REPAIR     abdominal  . JOINT REPLACEMENT     knee  . KNEE SURGERY    . WOUND DEBRIDEMENT     Social History   Occupational History  . Occupation: retired  Tobacco Use  . Smoking status: Former Smoker    Packs/day: 2.00    Years: 40.00    Pack years: 80.00    Last attempt to quit: 2006    Years since quitting: 13.1  . Smokeless tobacco: Never Used  Substance and Sexual Activity  . Alcohol use: No  . Drug use: No  . Sexual activity: Not on file

## 2018-06-19 ENCOUNTER — Other Ambulatory Visit: Payer: Self-pay | Admitting: Internal Medicine

## 2018-06-27 ENCOUNTER — Encounter (INDEPENDENT_AMBULATORY_CARE_PROVIDER_SITE_OTHER): Payer: Self-pay | Admitting: Orthopaedic Surgery

## 2018-06-27 ENCOUNTER — Ambulatory Visit (INDEPENDENT_AMBULATORY_CARE_PROVIDER_SITE_OTHER): Payer: Medicare Other | Admitting: Physician Assistant

## 2018-06-27 DIAGNOSIS — M25531 Pain in right wrist: Secondary | ICD-10-CM

## 2018-06-27 NOTE — Progress Notes (Signed)
Shannon Schmidt: Shannon Schmidt           Date of Birth: [birth year]           MRN: [id number] Visit Date: 06/27/2018 PCP: Shannon Schmidt, [name]   Assessment & Plan:  Chief Complaint:  Chief Complaint  Shannon Schmidt presents with  . Left Wrist - Pain, Follow-up   Visit Diagnoses:  1. Pain in right wrist     Plan: Shannon Schmidt is a pleasant 77 year old female who presents to our clinic today with continued swelling to the volar aspect of the right wrist.  She has had a history of ganglion cyst removal twice in the past.  Back in 2017 this started growing once more.  It has become the size of a golf ball.  We saw her back in February of this year for this issue where we ordered an MRI with and without contrast.  She never had this done, but instead the nurse practitioner at [facility] where she lives performed an ultrasound which she thought was adequate.  She has minimal pain to the area of concern.  She notes increased size over the past few months.  No fevers, chills or any other systemic symptoms.  She is a type II diabetic.  Examination of the right wrist shows a golf ball size  cyst to the volar aspect.  This is nontender.  No signs of infection or cellulitis.  Full sensation distally.  At this point, I have discussed with the Shannon Schmidt the need to get an MRI with and without contrast to further assess this.  She will follow-up with Shannon Schmidt once this has been completed.  Follow-Up Instructions: Return in about 2 weeks (around 07/11/2018).   Orders:  No orders of the defined types were placed in this encounter.  No orders of the defined types were placed in this encounter.   Imaging: No results found.  PMFS History: Shannon Schmidt Active Problem List   Diagnosis Date Noted  . Pain in right wrist 01/09/2018  . History of colonic polyps   . Sepsis ([place]) 06/08/2017  . HCAP (healthcare-associated pneumonia) 06/08/2017  . Osteomyelitis ([place]) 06/08/2017  . Oral thrush 06/08/2017  . Severe  malnutrition ([place]) 06/08/2017  . Hypocalcemia 06/08/2017  . HTN (hypertension) 03/10/2014  . Leukocytosis, unspecified 03/10/2014  . Anemia 03/10/2014  . Hyponatremia 03/10/2014  . Dyslipidemia 03/10/2014  . Pyelonephritis due to Escherichia coli 03/09/2014  . Ileus ([place]) 03/09/2014  . Diabetes mellitus ([place]) 03/09/2014  . Decubitus ulcers 03/09/2014   Past Medical History:  Diagnosis Date  . Anemia   . Bipolar 1 disorder ([place])   . Colostomy in place [facility])   . COPD (chronic obstructive pulmonary disease) ([place])   . Depression   . Diabetes mellitus   . GERD (gastroesophageal reflux disease)   . H/O hiatal hernia   . Muscle weakness   . Neuropathy   . Osteomyelitis ([place])   . Paraplegia ([place])   . PONV (postoperative nausea and vomiting)   . RA (rheumatoid arthritis) ([place])   . Schizophrenia ([place])     Family History  Problem Relation Age of Onset  . Hypertension Mother   . Diabetes type II Mother   . Stroke Father   . Cancer Other     Past Surgical History:  Procedure Laterality Date  . ABDOMINAL HYSTERECTOMY    . BREAST SURGERY     L breast abscess  . CATARACT EXTRACTION W/PHACO  05/09/2012   Procedure:  CATARACT EXTRACTION PHACO AND INTRAOCULAR LENS PLACEMENT (IOC);  Surgeon: Marylynn Pearson, MD;  Location: [facility];  Service: Ophthalmology;  Laterality: Right;  . COLON SURGERY    . COLONOSCOPY WITH PROPOFOL N/A 06/14/2017   Procedure: COLONOSCOPY WITH PROPOFOL;  Surgeon: Manus Gunning, MD;  [facility];  Service: Gastroenterology;  Laterality: N/A;  . ESOPHAGOGASTRODUODENOSCOPY (EGD) WITH PROPOFOL N/A 06/14/2017   Procedure: ESOPHAGOGASTRODUODENOSCOPY (EGD) WITH PROPOFOL;  Surgeon: Manus Gunning, MD;  [facility];  Service: Gastroenterology;  Laterality: N/A;  . EYE SURGERY     L cataract  . GANGLION CYST EXCISION     R wrist x2  . HERNIA REPAIR     abdominal  . JOINT REPLACEMENT     knee  . KNEE SURGERY    . WOUND DEBRIDEMENT       Social History   Occupational History  . Occupation: retired  Tobacco Use  . Smoking status: Former Smoker    Packs/day: 2.00    Years: 40.00    Pack years: 80.00    Last attempt to quit: 2006    Years since quitting: 13.5  . Smokeless tobacco: Never Used  Substance and Sexual Activity  . Alcohol use: No  . Drug use: No  . Sexual activity: Not on file

## 2018-06-27 NOTE — Addendum Note (Signed)
Addended by: Precious Bard on: 06/27/2018 02:50 PM   Modules accepted: Orders

## 2018-07-14 IMAGING — MR MR FOOT*R* WO/W CM
5 of 9 series · 19 of 40 positions shown · IV contrast (multihance)
Comparison: None.

CLINICAL DATA: Chronic foot infection. Patient gives a history of
chronic osteomyelitis. Abnormal radiographs dated 06/08/2017.

EXAM:
MRI OF THE RIGHT FOREFOOT WITHOUT AND WITH CONTRAST
TECHNIQUE: Multiplanar, multisequence MR imaging of the right forefoot was
performed before and after the administration of intravenous
contrast.
CONTRAST:  19mL MULTIHANCE GADOBENATE DIMEGLUMINE 529 MG/ML IV SOLN

[Series 2: T1 · coronal · 4.0mm · 0.29mm/px · 5 of 30 slices shown (1 of 2)]
[im 1/30]
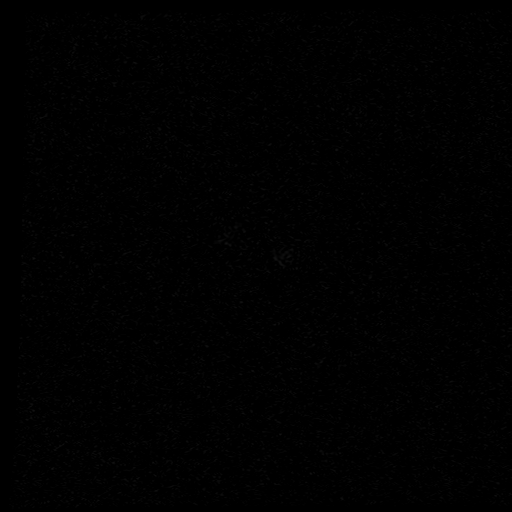
[im 8/30]
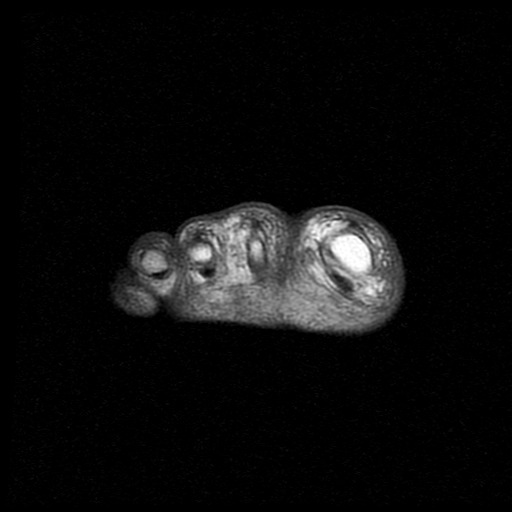
[im 15/30]
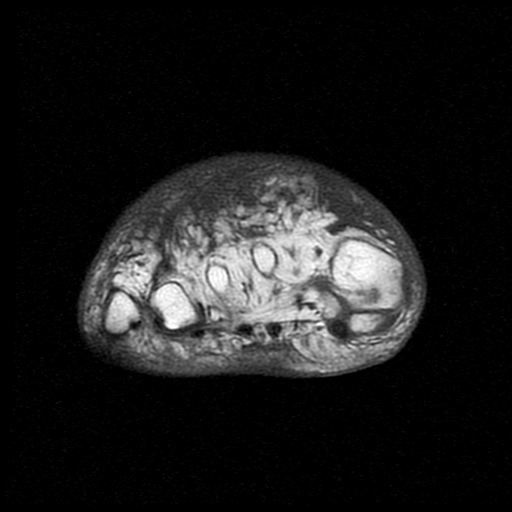
[im 22/30]
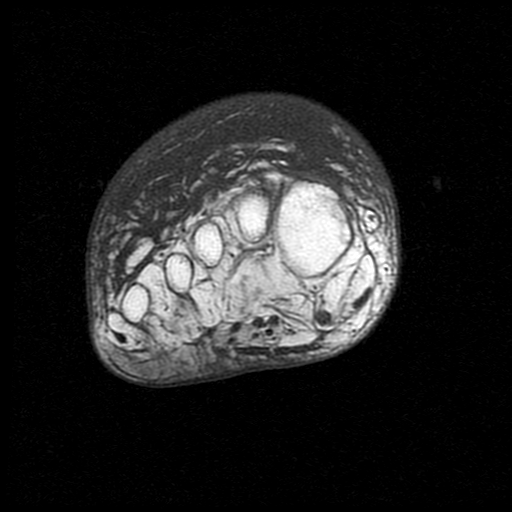
[im 30/30]
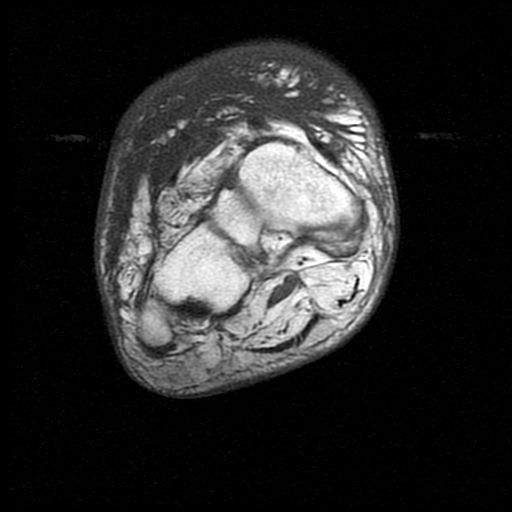

[Series 4: T1 · axial · 4.0mm · 0.31mm/px · z∈[-80,+8]mm · 4 of 20 slices shown (2 of 2)]
[im 1/20]
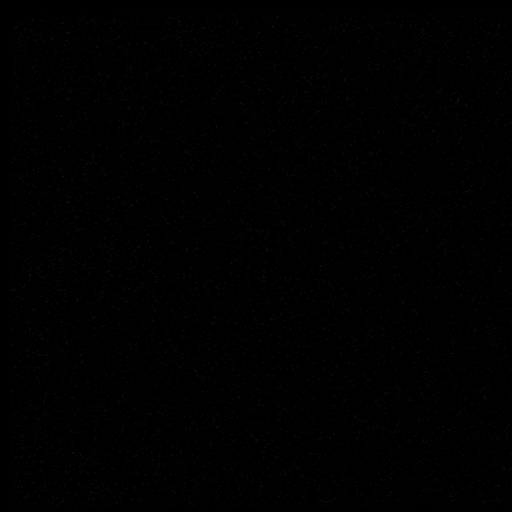
[im 7/20]
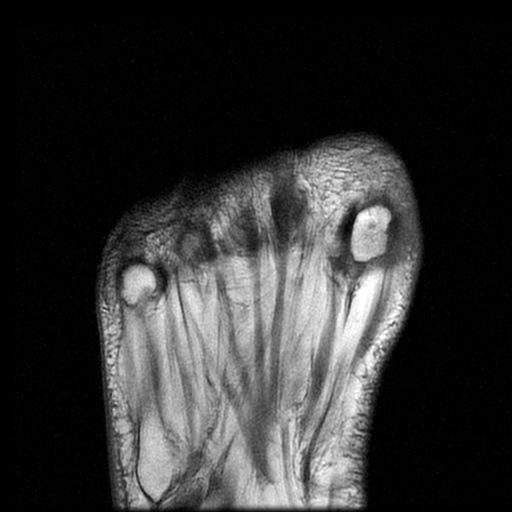
[im 13/20]
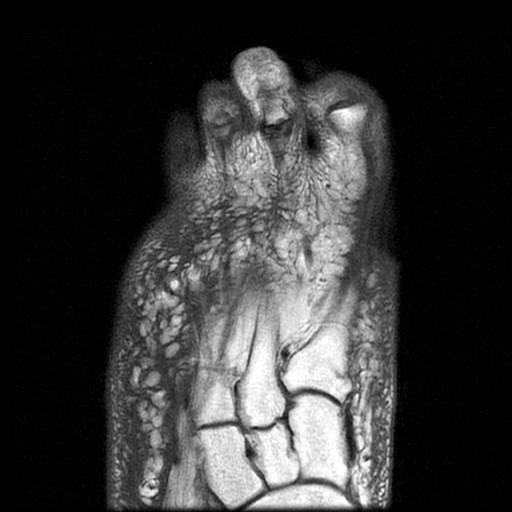
[im 20/20]
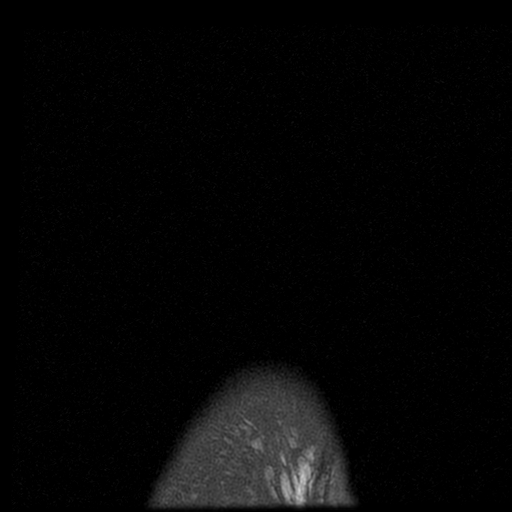

[Series 8: T1 fat-sat post-contrast · coronal · 4.0mm · 0.29mm/px · 5 of 30 slices shown]
[im 1/30]
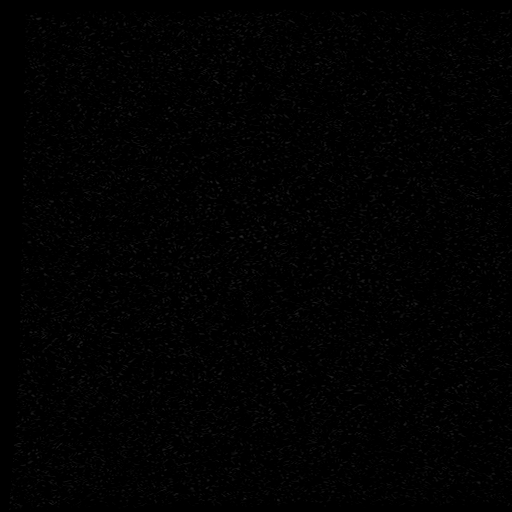
[im 8/30]
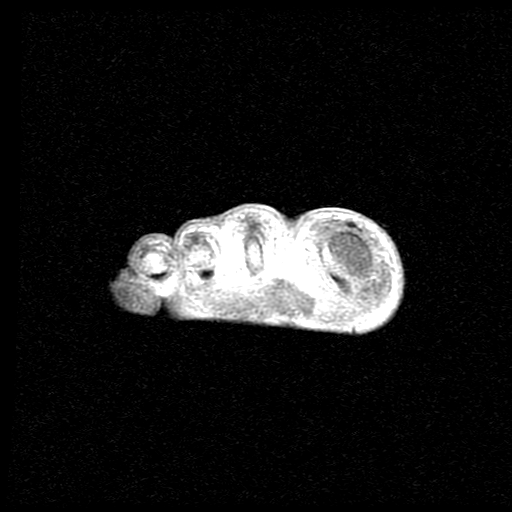
[im 15/30]
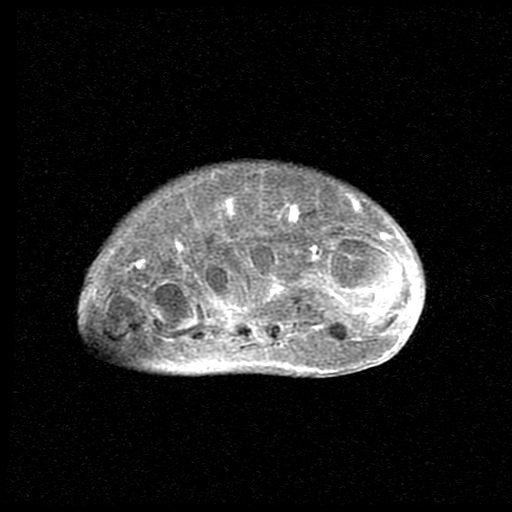
[im 22/30]
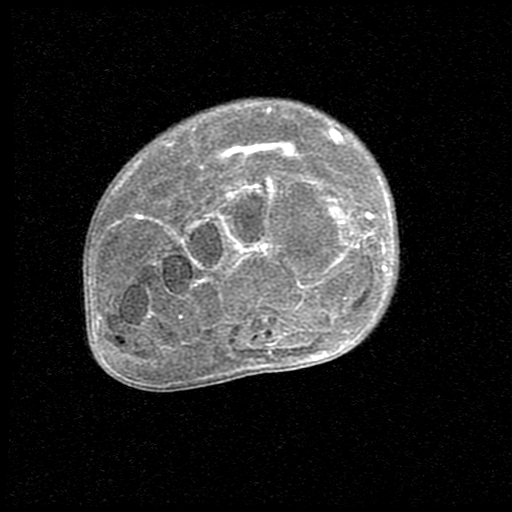
[im 30/30]
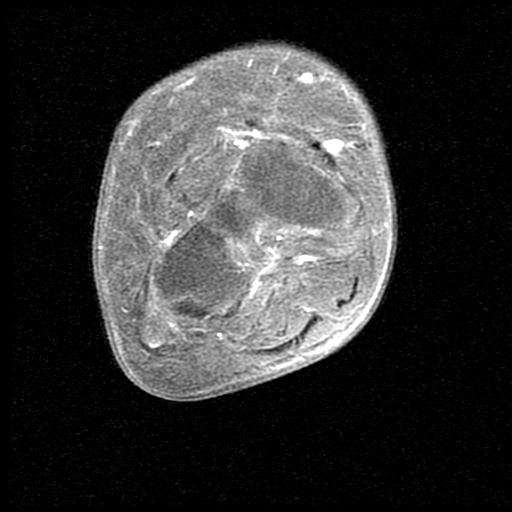

[Series 9: T1 post-contrast · axial · 4.0mm · 0.31mm/px · z∈[-80,+8]mm · 4 of 20 slices shown (1 of 2)]
[im 1/20]
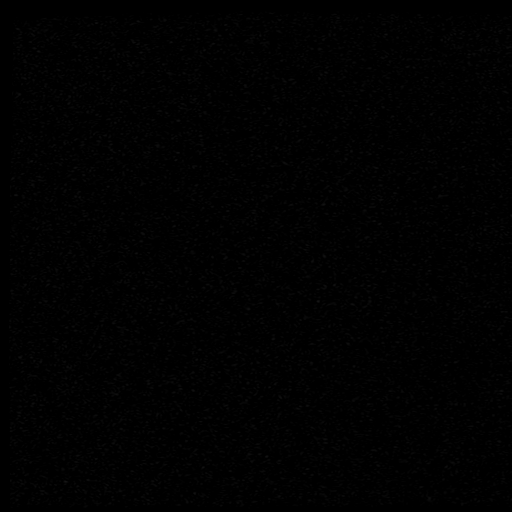
[im 7/20]
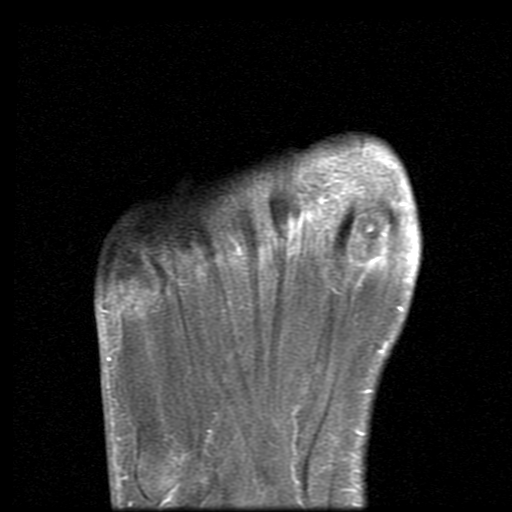
[im 13/20]
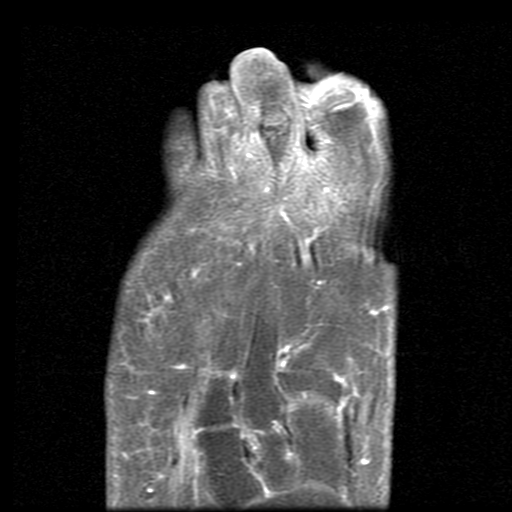
[im 20/20]
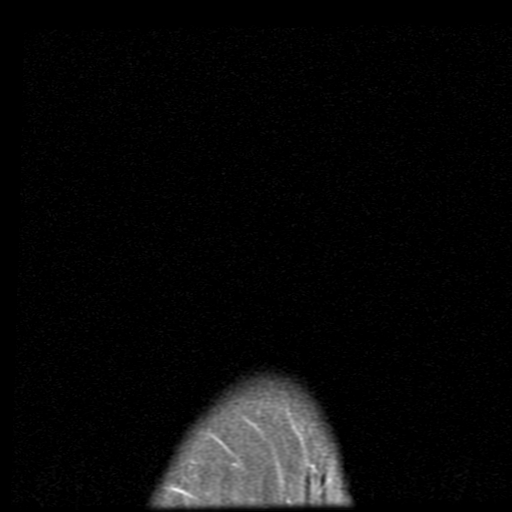

[Series 10: T1 post-contrast · sagittal · 4.0mm · 0.31mm/px · 1 of 24 slices shown (2 of 2)]
[im 1/24]
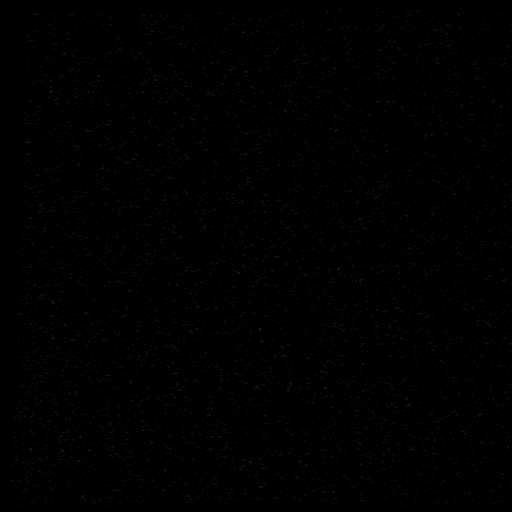

[19 of 40 positions shown; findings below may reference images not displayed]

FINDINGS: Bones/Joint/Cartilage

There is abnormal enhancement and edema in the medial aspect of the
head of the first metatarsal. There is very subtle abnormal edema
and enhancement of the medial aspect of the tuft of the distal
phalanx of the great toe. There is no cortical destruction at either
site. There is also slight abnormal edema and enhancement of the
plantar aspect of the head of the second metatarsal. There are no
appreciable overlying soft tissue ulcers.

No joint effusions.  The other bones of the forefoot appear normal.

Soft tissues

Prominent subcutaneous edema on the dorsum of the foot, nonspecific.
The muscles appear normal. No abscesses.
IMPRESSION: Abnormal edema and enhancement in the heads of the second and first
metatarsals with a tiny area of abnormal edema and enhancement in
the medial aspect of the tuft of the distal phalanx of the great
toe. Findings are consistent with early osteomyelitis.

## 2018-08-28 ENCOUNTER — Ambulatory Visit (INDEPENDENT_AMBULATORY_CARE_PROVIDER_SITE_OTHER): Payer: Medicare Other | Admitting: Orthopaedic Surgery

## 2018-08-28 ENCOUNTER — Encounter (INDEPENDENT_AMBULATORY_CARE_PROVIDER_SITE_OTHER): Payer: Self-pay | Admitting: Orthopaedic Surgery

## 2018-08-28 DIAGNOSIS — M25531 Pain in right wrist: Secondary | ICD-10-CM

## 2018-08-28 NOTE — Progress Notes (Signed)
Patient is a pleasant 77 year old wheelchair-bound female from [facility] who comes in today for her right wrist.  We have seen her several times since this past February.  We have put in multiple orders of an MRI with and without contrast of the right wrist.  She is still not had this.  She comes in today as the nurse practitioner at her facility told her that we would drain the cyst.  I spoken to the patient as well as Tammy with appointments at [facility] reinforcing the fact that we cannot drain this probable cyst due to its location.  We really need to obtain the MRI prior to the patient coming back.  The order is in agreement through imaging and I have given Tammy their contact information to schedule the scan.  The patient will follow-up with [place] once that has been completed.  There is nothing to be done in the meantime from our standpoint regarding this issue.

## 2018-10-15 ENCOUNTER — Ambulatory Visit
Admission: RE | Admit: 2018-10-15 | Discharge: 2018-10-15 | Disposition: A | Discharge status: Home / Self Care | Payer: Medicare Other | Source: Ambulatory Visit | Attending: Orthopaedic Surgery | Admitting: Orthopaedic Surgery

## 2018-10-15 DIAGNOSIS — M25531 Pain in right wrist: Secondary | ICD-10-CM

## 2018-10-15 NOTE — Progress Notes (Signed)
See report

## 2018-10-16 NOTE — Progress Notes (Signed)
Fu to discuss mri

## 2018-10-23 ENCOUNTER — Ambulatory Visit (INDEPENDENT_AMBULATORY_CARE_PROVIDER_SITE_OTHER): Payer: Medicare Other | Admitting: Orthopaedic Surgery

## 2018-10-23 DIAGNOSIS — M6740 Ganglion, unspecified site: Secondary | ICD-10-CM

## 2018-10-23 DIAGNOSIS — M67431 Ganglion, right wrist: Secondary | ICD-10-CM | POA: Diagnosis not present

## 2018-10-23 MED ORDER — BUPIVACAINE HCL 0.25 % IJ SOLN
0.3300 mL | INTRAMUSCULAR | Status: AC | PRN
  Administered 2018-10-23: .33 mL

## 2018-10-23 MED ORDER — LIDOCAINE HCL 1 % IJ SOLN
0.3000 mL | INTRAMUSCULAR | Status: AC | PRN
  Administered 2018-10-23: .3 mL

## 2018-10-23 MED ORDER — METHYLPREDNISOLONE ACETATE 40 MG/ML IJ SUSP
13.3300 mg | INTRAMUSCULAR | Status: AC | PRN
  Administered 2018-10-23: 13.33 mg

## 2018-10-23 NOTE — Progress Notes (Signed)
Office Visit Note   Patient: Shannon Schmidt           Date of Birth: [birth year]           MRN: [id number] Visit Date: 10/23/2018              Requested by: No referring provider defined for this encounter. PCP: Patient, [name]   Assessment & Plan: Visit Diagnoses:  1. Ganglion cyst     Plan: Impression is symptomatic radial sided distal radius ganglion.  Today, we aspirated 12 cc cystic fluid from the right wrist ganglion.  I then aspirated this with cortisone.  She will follow-up with [place] as needed.  Follow-Up Instructions: Return if symptoms worsen or fail to improve.   Orders:  Orders Placed This Encounter  Procedures  . Hand/UE Inj: R wrist   No orders of the defined types were placed in this encounter.     Procedures: Hand/UE Inj: R wrist for volar carpal ganglion on 10/23/2018 3:07 PM Medications: 0.3 mL lidocaine 1 %; 0.33 mL bupivacaine 0.25 %; 13.33 mg methylPREDNISolone acetate 40 MG/ML      Clinical Data: No additional findings.   Subjective: Chief Complaint  Patient presents with  . Right Wrist - Follow-up    MRI review     HPI patient is a pleasant 77 year old female who presents to our clinic today to discuss MRI results of the right wrist.  She has had a symptomatic ganglion for well over a year now.  MRI of the right wrist shows a 2.5 x 2.6 x 3.8 cm ganglion radial aspect of the distal radius.  She also has severe osteoarthritis radio scaphoid joint, scaphotrapezio trapezoid joint, first [place] joint and she has a radially dislocated ECU with mild tendinosis.  She does remain most symptomatic over the ganglion distal radius.  Review of Systems as detailed in HPI.  All others reviewed and are negative.   Objective: Vital Signs: There were no vitals taken for this visit.  Physical Exam well-developed well-nourished female in no acute distress.  Alert and oriented x3.  Ortho Exam examination of the right wrist is stable  Specialty Comments:   No specialty comments available.  Imaging: No new imaging   PMFS History: Patient Active Problem List   Diagnosis Date Noted  . Ganglion cyst 10/23/2018  . Pain in right wrist 01/09/2018  . History of colonic polyps   . Sepsis ([place]) 06/08/2017  . HCAP (healthcare-associated pneumonia) 06/08/2017  . Osteomyelitis ([place]) 06/08/2017  . Oral thrush 06/08/2017  . Severe malnutrition ([place]) 06/08/2017  . Hypocalcemia 06/08/2017  . HTN (hypertension) 03/10/2014  . Leukocytosis, unspecified 03/10/2014  . Anemia 03/10/2014  . Hyponatremia 03/10/2014  . Dyslipidemia 03/10/2014  . Pyelonephritis due to Escherichia coli 03/09/2014  . Ileus ([place]) 03/09/2014  . Diabetes mellitus ([place]) 03/09/2014  . Decubitus ulcers 03/09/2014   Past Medical History:  Diagnosis Date  . Anemia   . Bipolar 1 disorder ([place])   . Colostomy in place [facility])   . COPD (chronic obstructive pulmonary disease) ([place])   . Depression   . Diabetes mellitus   . GERD (gastroesophageal reflux disease)   . H/O hiatal hernia   . Muscle weakness   . Neuropathy   . Osteomyelitis ([place])   . Paraplegia ([place])   . PONV (postoperative nausea and vomiting)   . RA (rheumatoid arthritis) ([place])   . Schizophrenia ([place])     Family History  Problem Relation Age of Onset  .  Hypertension Mother   . Diabetes type II Mother   . Stroke Father   . Cancer Other     Past Surgical History:  Procedure Laterality Date  . ABDOMINAL HYSTERECTOMY    . BREAST SURGERY     L breast abscess  . CATARACT EXTRACTION W/PHACO  05/09/2012   Procedure: CATARACT EXTRACTION PHACO AND INTRAOCULAR LENS PLACEMENT (IOC);  Surgeon: Marylynn Pearson, MD;  Location: [facility];  Service: Ophthalmology;  Laterality: Right;  . COLON SURGERY    . COLONOSCOPY WITH PROPOFOL N/A 06/14/2017   Procedure: COLONOSCOPY WITH PROPOFOL;  Surgeon: Manus Gunning, MD;  [facility];  Service: Gastroenterology;  Laterality: N/A;  . ESOPHAGOGASTRODUODENOSCOPY (EGD)  WITH PROPOFOL N/A 06/14/2017   Procedure: ESOPHAGOGASTRODUODENOSCOPY (EGD) WITH PROPOFOL;  Surgeon: Manus Gunning, MD;  [facility];  Service: Gastroenterology;  Laterality: N/A;  . EYE SURGERY     L cataract  . GANGLION CYST EXCISION     R wrist x2  . HERNIA REPAIR     abdominal  . JOINT REPLACEMENT     knee  . KNEE SURGERY    . WOUND DEBRIDEMENT     Social History   Occupational History  . Occupation: retired  Tobacco Use  . Smoking status: Former Smoker    Packs/day: 2.00    Years: 40.00    Pack years: 80.00    Last attempt to quit: 2006    Years since quitting: 13.9  . Smokeless tobacco: Never Used  Substance and Sexual Activity  . Alcohol use: No  . Drug use: No  . Sexual activity: Not on file

## 2019-01-22 ENCOUNTER — Ambulatory Visit (INDEPENDENT_AMBULATORY_CARE_PROVIDER_SITE_OTHER): Payer: Medicare Other | Admitting: Orthopaedic Surgery

## 2019-02-05 ENCOUNTER — Encounter (INDEPENDENT_AMBULATORY_CARE_PROVIDER_SITE_OTHER): Payer: Self-pay | Admitting: Orthopaedic Surgery

## 2019-02-05 ENCOUNTER — Ambulatory Visit (INDEPENDENT_AMBULATORY_CARE_PROVIDER_SITE_OTHER): Payer: Medicare Other | Admitting: Orthopaedic Surgery

## 2019-02-05 DIAGNOSIS — M67431 Ganglion, right wrist: Secondary | ICD-10-CM

## 2019-02-05 MED ORDER — METHYLPREDNISOLONE ACETATE 40 MG/ML IJ SUSP
13.3300 mg | INTRAMUSCULAR | Status: AC | PRN
  Administered 2019-02-05: 13.33 mg

## 2019-02-05 MED ORDER — BUPIVACAINE HCL 0.25 % IJ SOLN
0.3300 mL | INTRAMUSCULAR | Status: AC | PRN
  Administered 2019-02-05: .33 mL

## 2019-02-05 MED ORDER — LIDOCAINE HCL 1 % IJ SOLN
0.3000 mL | INTRAMUSCULAR | Status: AC | PRN
  Administered 2019-02-05: .3 mL

## 2019-02-05 NOTE — Progress Notes (Signed)
Office Visit Note   Patient: Shannon Schmidt           Date of Birth: 03-22-1941           MRN: [id number] Visit Date: 02/05/2019              Requested by: No referring provider defined for this encounter. PCP: Patient, [name]   Assessment & Plan: Visit Diagnoses:  1. 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  I did discuss likely recurrence if we proceed with a repeat aspiration injection today.  We have discussed surgical intervention to include excision of the ganglion cyst.  She would like to proceed with another aspiration today.  Unsuccessful aspiration today.  We will also obtain a hemoglobin A1c.  We will go ahead and fill out paperwork to proceed with excision right wrist volar ganglion.  We will call her with the results of her hemoglobin A1c.  We will proceed with surgical intervention so long as her A1c is under 8.0.  If this is the case, we will have Judeen Hammans contact her to schedule a date.   Follow-Up Instructions: Return if symptoms worsen or fail to improve.   Orders:  Orders Placed This Encounter  Procedures  . Hand/UE Inj  . HgB A1c   No orders of the defined types were placed in this encounter.     Procedures: Hand/UE Inj for volar carpal ganglion on 02/05/2019 3:46 PM Medications: 0.33 mL bupivacaine 0.25 %; 0.3 mL lidocaine 1 %; 13.33 mg methylPREDNISolone acetate 40 MG/ML      Clinical Data: No additional findings.   Subjective: Chief Complaint  Patient presents with  . Right Wrist - Pain    HPI patient is a pleasant right-hand dominant 78 year old female presents our clinic today with recurrent right wrist volar ganglion cyst.  She initially had this excised back in 1998.  Returned approximately 2 years ago.  She was seen in our office multiple times over the past year.  MRI of the right wrist showed a ganglion cyst radial aspect 2.5 x 2.6 x 3.8 cm.  We aspirated  and injected the ganglion cyst on 10/23/2018.  She notes that the cyst came back approximately 2 days after this.  She has mild pain and denies any numbness, tingling or burning.  Review of Systems as detailed in HPI.  All others reviewed and are negative.   Objective: Vital Signs: There were no vitals taken for this visit.  Physical Exam well-developed well-nourished female in no acute distress.  Alert and oriented x3.    Ortho Exam examination of her right wrist reveals a golf ball sized cyst to the radial aspect.  This is nontender.  No skin changes.  She is neurovascularly intact distally.  Specialty Comments:  No specialty comments available.  Imaging: No new imaging   PMFS History: Patient Active Problem List   Diagnosis Date Noted  . Ganglion cyst 10/23/2018  . Pain in right wrist 01/09/2018  . History of colonic polyps   . Sepsis ([place]) 06/08/2017  . HCAP (healthcare-associated pneumonia) 06/08/2017  . Osteomyelitis ([place]) 06/08/2017  . Oral thrush 06/08/2017  . Severe malnutrition ([place]) 06/08/2017  . Hypocalcemia 06/08/2017  . HTN (hypertension) 03/10/2014  . Leukocytosis, unspecified 03/10/2014  . Anemia 03/10/2014  . Hyponatremia 03/10/2014  . Dyslipidemia 03/10/2014  . Pyelonephritis due to Escherichia coli 03/09/2014  . Ileus ([place]) 03/09/2014  .  Diabetes mellitus ([place]) 03/09/2014  . Decubitus ulcers 03/09/2014   Past Medical History:  Diagnosis Date  . Anemia   . Bipolar 1 disorder ([place])   . Colostomy in place [facility])   . COPD (chronic obstructive pulmonary disease) ([place])   . Depression   . Diabetes mellitus   . GERD (gastroesophageal reflux disease)   . H/O hiatal hernia   . Muscle weakness   . Neuropathy   . Osteomyelitis ([place])   . Paraplegia ([place])   . PONV (postoperative nausea and vomiting)   . RA (rheumatoid arthritis) ([place])   . Schizophrenia ([place])     Family History  Problem Relation Age of Onset  . Hypertension Mother   . Diabetes type II  Mother   . Stroke Father   . Cancer Other     Past Surgical History:  Procedure Laterality Date  . ABDOMINAL HYSTERECTOMY    . BREAST SURGERY     L breast abscess  . CATARACT EXTRACTION W/PHACO  05/09/2012   Procedure: CATARACT EXTRACTION PHACO AND INTRAOCULAR LENS PLACEMENT (IOC);  Surgeon: Marylynn Pearson, MD;  Location: [facility];  Service: Ophthalmology;  Laterality: Right;  . COLON SURGERY    . COLONOSCOPY WITH PROPOFOL N/A 06/14/2017   Procedure: COLONOSCOPY WITH PROPOFOL;  Surgeon: Manus Gunning, MD;  [facility];  Service: Gastroenterology;  Laterality: N/A;  . ESOPHAGOGASTRODUODENOSCOPY (EGD) WITH PROPOFOL N/A 06/14/2017   Procedure: ESOPHAGOGASTRODUODENOSCOPY (EGD) WITH PROPOFOL;  Surgeon: Manus Gunning, MD;  [facility];  Service: Gastroenterology;  Laterality: N/A;  . EYE SURGERY     L cataract  . GANGLION CYST EXCISION     R wrist x2  . HERNIA REPAIR     abdominal  . JOINT REPLACEMENT     knee  . KNEE SURGERY    . WOUND DEBRIDEMENT     Social History   Occupational History  . Occupation: retired  Tobacco Use  . Smoking status: Former Smoker    Packs/day: 2.00    Years: 40.00    Pack years: 80.00    Last attempt to quit: 2006    Years since quitting: 14.1  . Smokeless tobacco: Never Used  Substance and Sexual Activity  . Alcohol use: No  . Drug use: No  . Sexual activity: Not on file

## 2019-02-06 LAB — HEMOGLOBIN A1C
Hgb A1c MFr Bld: 7.7 % of total Hgb — ABNORMAL HIGH (ref <5.7)
Mean Plasma Glucose: 174 (calc)
eAG (mmol/L): 9.7 (calc)

## 2019-02-06 LAB — TIQ-NTM

## 2019-02-06 NOTE — Progress Notes (Signed)
Can you call Cherylann and [facility] and let her know that [facility] is low enough to proceed with surgery and that sherri will call her to schedule?  Sherri, can you call Sandhya after Kathlee Nations speaks with her?

## 2019-02-06 NOTE — Progress Notes (Signed)
-   Ok to proceed with surgery

## 2019-02-14 ENCOUNTER — Other Ambulatory Visit: Payer: Self-pay

## 2019-02-14 NOTE — Patient Outreach (Signed)
[facility]) Care Management  02/14/2019  JOLINDA PINKSTAFF [birth year] [id number]   Medication Adherence call to Mrs. Howell Pringle patient did not answer patient is due on Simvastatin 20 mg under [place].   [place] Management Direct Dial [phone]  Fax [phone] [email or web address]

## 2019-03-05 ENCOUNTER — Other Ambulatory Visit (HOSPITAL_COMMUNITY): Payer: Self-pay | Admitting: *Deleted

## 2019-03-06 ENCOUNTER — Other Ambulatory Visit: Payer: Self-pay

## 2019-03-06 ENCOUNTER — Ambulatory Visit (HOSPITAL_COMMUNITY)
Admission: RE | Admit: 2019-03-06 | Discharge: 2019-03-06 | Disposition: A | Discharge status: Home / Self Care | Payer: Medicare Other | Source: Ambulatory Visit | Attending: Internal Medicine | Admitting: Internal Medicine

## 2019-03-07 ENCOUNTER — Other Ambulatory Visit: Payer: Self-pay

## 2019-03-07 ENCOUNTER — Ambulatory Visit (HOSPITAL_COMMUNITY)
Admission: RE | Admit: 2019-03-07 | Discharge: 2019-03-07 | Disposition: A | Discharge status: Home / Self Care | Payer: Medicare Other | Source: Ambulatory Visit | Attending: Internal Medicine | Admitting: Internal Medicine

## 2019-03-07 DIAGNOSIS — D649 Anemia, unspecified: Secondary | ICD-10-CM | POA: Insufficient documentation

## 2019-03-07 LAB — PREPARE RBC (CROSSMATCH)

## 2019-03-07 MED ORDER — SODIUM CHLORIDE 0.9% IV SOLUTION: Freq: Once | INTRAVENOUS | Status: DC

## 2019-03-08 LAB — TYPE AND SCREEN
ABO/RH(D): O POS
Antibody Screen: NEGATIVE
Unit division: 0

## 2019-03-08 LAB — BPAM RBC
Blood Product Expiration Date: 202004112359
ISSUE DATE / TIME: 202004090939
Unit Type and Rh: 9500

## 2019-03-15 ENCOUNTER — Other Ambulatory Visit: Payer: Self-pay

## 2019-03-15 NOTE — Patient Outreach (Signed)
[facility]) Care Management  03/15/2019  TYRICA AFZAL 08/16/1941 [id number]   Medication Adherence call to Mrs. Howell Pringle patient is showing past due on Simvastatin 20 mg patient is at a nursing home at this time and they provide with all of her medications. Mrs. Eilyn Polack is showing past due under [place].   [place] Management Direct Dial [phone]  Fax [phone] [email or web address]

## 2020-05-03 ENCOUNTER — Other Ambulatory Visit: Payer: Self-pay

## 2020-05-03 ENCOUNTER — Emergency Department (HOSPITAL_COMMUNITY)
Admission: EM | Admit: 2020-05-03 | Discharge: 2020-05-03 | Disposition: A | Discharge status: Home / Self Care | Payer: Medicare Other | Attending: Emergency Medicine | Admitting: Emergency Medicine

## 2020-05-03 ENCOUNTER — Encounter (HOSPITAL_COMMUNITY): Payer: Self-pay

## 2020-05-03 DIAGNOSIS — Z87891 Personal history of nicotine dependence: Secondary | ICD-10-CM | POA: Diagnosis not present

## 2020-05-03 DIAGNOSIS — K6289 Other specified diseases of anus and rectum: Secondary | ICD-10-CM | POA: Diagnosis not present

## 2020-05-03 DIAGNOSIS — Z794 Long term (current) use of insulin: Secondary | ICD-10-CM | POA: Diagnosis not present

## 2020-05-03 DIAGNOSIS — Z79899 Other long term (current) drug therapy: Secondary | ICD-10-CM | POA: Insufficient documentation

## 2020-05-03 DIAGNOSIS — E1140 Type 2 diabetes mellitus with diabetic neuropathy, unspecified: Secondary | ICD-10-CM | POA: Diagnosis not present

## 2020-05-03 DIAGNOSIS — R300 Dysuria: Secondary | ICD-10-CM | POA: Diagnosis present

## 2020-05-03 DIAGNOSIS — I10 Essential (primary) hypertension: Secondary | ICD-10-CM | POA: Diagnosis not present

## 2020-05-03 DIAGNOSIS — B3741 Candidal cystitis and urethritis: Secondary | ICD-10-CM | POA: Diagnosis not present

## 2020-05-03 DIAGNOSIS — J449 Chronic obstructive pulmonary disease, unspecified: Secondary | ICD-10-CM | POA: Insufficient documentation

## 2020-05-03 LAB — COMPREHENSIVE METABOLIC PANEL
ALT: 17 U/L (ref 0–44)
AST: 17 U/L (ref 15–41)
Albumin: 3.9 g/dL (ref 3.5–5.0)
Alkaline Phosphatase: 66 U/L (ref 38–126)
Anion gap: 13 (ref 5–15)
BUN: 10 mg/dL (ref 8–23)
CO2: 26 mmol/L (ref 22–32)
Calcium: 8.9 mg/dL (ref 8.9–10.3)
Chloride: 99 mmol/L (ref 98–111)
Creatinine, Ser: 0.64 mg/dL (ref 0.44–1.00)
GFR calc Af Amer: >60 mL/min (ref >60)
GFR calc non Af Amer: >60 mL/min (ref >60)
Glucose, Bld: 197 mg/dL — ABNORMAL HIGH (ref 70–99)
Potassium: 4.3 mmol/L (ref 3.5–5.1)
Sodium: 138 mmol/L (ref 135–145)
Total Bilirubin: 0.6 mg/dL (ref 0.3–1.2)
Total Protein: 8.1 g/dL (ref 6.5–8.1)

## 2020-05-03 LAB — CBC WITH DIFFERENTIAL/PLATELET
Abs Immature Granulocytes: 0 10*3/uL (ref 0.00–0.07)
Basophils Absolute: 0 10*3/uL (ref 0.0–0.1)
Basophils Relative: 0 %
Eosinophils Absolute: 0.2 10*3/uL (ref 0.0–0.5)
Eosinophils Relative: 6 %
HCT: 38.7 % (ref 36.0–46.0)
Hemoglobin: 12.1 g/dL (ref 12.0–15.0)
Immature Granulocytes: 0 %
Lymphocytes Relative: 40 %
Lymphs Abs: 1.4 10*3/uL (ref 0.7–4.0)
MCH: 28.7 pg (ref 26.0–34.0)
MCHC: 31.3 g/dL (ref 30.0–36.0)
MCV: 91.7 fL (ref 80.0–100.0)
Monocytes Absolute: 0.4 10*3/uL (ref 0.1–1.0)
Monocytes Relative: 11 %
Neutro Abs: 1.5 10*3/uL — ABNORMAL LOW (ref 1.7–7.7)
Neutrophils Relative %: 43 %
Platelets: 234 10*3/uL (ref 150–400)
RBC: 4.22 MIL/uL (ref 3.87–5.11)
RDW: 14.9 % (ref 11.5–15.5)
WBC: 3.4 10*3/uL — ABNORMAL LOW (ref 4.0–10.5)
nRBC: 0 % (ref 0.0–0.2)

## 2020-05-03 LAB — URINALYSIS, ROUTINE W REFLEX MICROSCOPIC
Bilirubin Urine: NEGATIVE
Glucose, UA: NEGATIVE mg/dL
Hgb urine dipstick: NEGATIVE
Ketones, ur: NEGATIVE mg/dL
Nitrite: NEGATIVE
Protein, ur: NEGATIVE mg/dL
Specific Gravity, Urine: 1.006 (ref 1.005–1.030)
pH: 8 (ref 5.0–8.0)

## 2020-05-03 MED ORDER — FLUCONAZOLE 150 MG PO TABS: ORAL_TABLET | ORAL | 0 refills | Status: AC

## 2020-05-03 MED ORDER — CEPHALEXIN 500 MG PO CAPS: 500.0000 mg | ORAL_CAPSULE | Freq: Two times a day (BID) | ORAL | 0 refills | Status: AC

## 2020-05-03 MED ORDER — FLUCONAZOLE 150 MG PO TABS
150.0000 mg | ORAL_TABLET | Freq: Once | ORAL | Status: AC
  Administered 2020-05-03: 150 mg via ORAL
  Filled 2020-05-03: qty 1

## 2020-05-03 MED ORDER — SODIUM CHLORIDE 0.9 % IV SOLN
1.0000 g | Freq: Once | INTRAVENOUS | Status: AC
  Administered 2020-05-03: 1 g via INTRAVENOUS
  Filled 2020-05-03: qty 10

## 2020-05-03 NOTE — ED Provider Notes (Signed)
[facility] DEPT Provider Note   CSN: [id number] Arrival date & time: 05/03/20  1606     History Chief Complaint  Patient presents with  . Urinary Tract Infection  . Rectal Pain    Shannon Schmidt is a 79 y.o. female is medical history of diabetes, paraplegia, decubitus ulcer who presents from [facility] care for evaluation of rectal pain and burning with urination.  Patient states that she has had 2 months of burning while she urinates.  She been treated for UTI multiple times without resolution of her symptoms.  She has a decubitus ulcer which is being treated.  She denies fever chills or suprapubic pain.  She has poor sensation in the region of her perineum and pelvis.  HPI     Past Medical History:  Diagnosis Date  . Anemia   . Bipolar 1 disorder ([place])   . Colostomy in place [facility])   . COPD (chronic obstructive pulmonary disease) ([place])   . Depression   . Diabetes mellitus   . GERD (gastroesophageal reflux disease)   . H/O hiatal hernia   . Muscle weakness   . Neuropathy   . Osteomyelitis ([place])   . Paraplegia ([place])   . PONV (postoperative nausea and vomiting)   . RA (rheumatoid arthritis) ([place])   . Schizophrenia [facility])     Patient Active Problem List   Diagnosis Date Noted  . Ganglion cyst 10/23/2018  . Pain in right wrist 01/09/2018  . History of colonic polyps   . Sepsis ([place]) 06/08/2017  . HCAP (healthcare-associated pneumonia) 06/08/2017  . Osteomyelitis ([place]) 06/08/2017  . Oral thrush 06/08/2017  . Severe malnutrition ([place]) 06/08/2017  . Hypocalcemia 06/08/2017  . HTN (hypertension) 03/10/2014  . Leukocytosis, unspecified 03/10/2014  . Anemia 03/10/2014  . Hyponatremia 03/10/2014  . Dyslipidemia 03/10/2014  . Pyelonephritis due to Escherichia coli 03/09/2014  . Ileus ([place]) 03/09/2014  . Diabetes mellitus ([place]) 03/09/2014  . Decubitus ulcers 03/09/2014    Past Surgical History:  Procedure Laterality Date  .  ABDOMINAL HYSTERECTOMY    . BREAST SURGERY     L breast abscess  . CATARACT EXTRACTION W/PHACO  05/09/2012   Procedure: CATARACT EXTRACTION PHACO AND INTRAOCULAR LENS PLACEMENT (IOC);  Surgeon: Marylynn Pearson, MD;  Location: [facility];  Service: Ophthalmology;  Laterality: Right;  . COLON SURGERY    . COLONOSCOPY WITH PROPOFOL N/A 06/14/2017   Procedure: COLONOSCOPY WITH PROPOFOL;  Surgeon: Manus Gunning, MD;  [facility];  Service: Gastroenterology;  Laterality: N/A;  . ESOPHAGOGASTRODUODENOSCOPY (EGD) WITH PROPOFOL N/A 06/14/2017   Procedure: ESOPHAGOGASTRODUODENOSCOPY (EGD) WITH PROPOFOL;  Surgeon: Manus Gunning, MD;  [facility];  Service: Gastroenterology;  Laterality: N/A;  . EYE SURGERY     L cataract  . GANGLION CYST EXCISION     R wrist x2  . HERNIA REPAIR     abdominal  . JOINT REPLACEMENT     knee  . KNEE SURGERY    . WOUND DEBRIDEMENT       OB History   No obstetric history on file.     Family History  Problem Relation Age of Onset  . Hypertension Mother   . Diabetes type II Mother   . Stroke Father   . Cancer Other     Social History   Tobacco Use  . Smoking status: Former Smoker    Packs/day: 2.00    Years: 40.00    Pack years: 80.00    Quit date:  2006    Years since quitting: 15.4  . Smokeless tobacco: Never Used  Substance Use Topics  . Alcohol use: No  . Drug use: No    Home Medications Prior to Admission medications   Medication Sig Start Date End Date Taking? Authorizing Provider  acetaminophen (MAPAP) 325 MG tablet Take 650 mg by mouth every 8 (eight) hours as needed for mild pain or fever. For pain or fever.     [provider]  albuterol (PROVENTIL) (2.5 MG/3ML) 0.083% nebulizer solution Take 2.5 mg by nebulization every 6 (six) hours as needed for wheezing or shortness of breath.    [provider]  diphenhydrAMINE (BENADRYL) 25 mg capsule Take 25 mg by mouth every 8 (eight) hours as  needed for itching. For itching and/or swelling.    [provider]  divalproex (DEPAKOTE ER) 250 MG 24 hr tablet Take 250 mg by mouth at bedtime.    [provider]  ferrous sulfate 325 (65 FE) MG tablet Take 325 mg by mouth 2 (two) times daily with a meal.    [provider]  furosemide (LASIX) 20 MG tablet Take 20 mg by mouth daily.    [provider]  gabapentin (NEURONTIN) 300 MG capsule Take 300 mg by mouth 3 (three) times daily.    [provider]  insulin detemir (LEVEMIR) 100 UNIT/ML injection Inject 26-55 Units into the skin 2 (two) times daily. Inject 24units at 9a and 55 units at 5pm    [provider]  insulin lispro (HUMALOG) 100 UNIT/ML injection Inject 0-10 Units into the skin 2 (two) times daily. 61-150 0 units 151-200 2 units 201-250 4 units 251-300 6 units 301-350 8 units 351-400 10 units    [provider]  ipratropium-albuterol (DUONEB) 0.5-2.5 (3) MG/3ML SOLN Take 3 mLs by nebulization 4 (four) times daily.    [provider]  latanoprost (XALATAN) 0.005 % ophthalmic solution Place 1 drop into both eyes at bedtime.    [provider]  loratadine (CLARITIN) 10 MG tablet Take 10 mg by mouth every morning.     [provider]  magnesium hydroxide (MILK OF MAGNESIA) 400 MG/5ML suspension Take 30 mLs by mouth daily as needed for mild constipation.    [provider]  metFORMIN (GLUCOPHAGE) 500 MG tablet Take 500 mg by mouth daily with breakfast.    [provider]  metoprolol succinate (TOPROL-XL) 25 MG 24 hr tablet Take 25 mg by mouth daily.    [provider]  Multiple Vitamin (MULTIVITAMIN WITH MINERALS) TABS tablet Take 1 tablet by mouth daily.    [provider]  omeprazole (PRILOSEC) 20 MG capsule Take 20 mg by mouth 2 (two) times daily before a meal. For heartburn.    [provider]  oxycodone (ROXICODONE) 30 MG immediate release tablet  Take 30 mg by mouth every 6 (six) hours.    [provider]  predniSONE (DELTASONE) 10 MG tablet Take 1 tablet (10 mg total) by mouth daily with breakfast. 06/16/17   Aline August, MD  risperiDONE (RISPERDAL) 1 MG tablet Take 1.5 mg by mouth at bedtime.     [provider]  simethicone (MI-ACID GAS RELIEF) 80 MG chewable tablet Chew 80 mg by mouth every 12 (twelve) hours as needed. For gas.    [provider]  simvastatin (ZOCOR) 10 MG tablet Take 10 mg by mouth at bedtime.    [provider]  sodium hypochlorite (DAKIN'S 1/4 STRENGTH) 0.125 %  SOLN Irrigate with 1 application as directed every evening. Apply to left sacrum at dressing changes nightly    [provider]  tiZANidine (ZANAFLEX) 2 MG tablet Take 2 mg by mouth 2 (two) times daily.    [provider]  Vitamin D, Ergocalciferol, (DRISDOL) 50000 units CAPS capsule Take 50,000 Units by mouth every 30 (thirty) days.    [provider]    Allergies    Ace inhibitors, Chlorpromazine, and Haloperidol decanoate  Review of Systems   Review of Systems Ten systems reviewed and are negative for acute change, except as noted in the HPI.   Physical Exam Updated Vital Signs SpO2 94%   Physical Exam Vitals and nursing note reviewed. Exam conducted with a chaperone present.  Constitutional:      General: She is not in acute distress.    Appearance: She is well-developed. She is not diaphoretic.  HENT:     Head: Normocephalic and atraumatic.  Eyes:     General: No scleral icterus.    Conjunctiva/sclera: Conjunctivae normal.  Cardiovascular:     Rate and Rhythm: Normal rate and regular rhythm.     Heart sounds: Normal heart sounds. No murmur. No friction rub. No gallop.   Pulmonary:     Effort: Pulmonary effort is normal. No respiratory distress.     Breath sounds: Normal breath sounds.  Abdominal:     General: Bowel sounds are normal. There is no distension.      Palpations: Abdomen is soft. There is no mass.     Tenderness: There is no abdominal tenderness. There is no guarding.     Comments: Colostomy in place in the left lower quadrant of the abdomen with brown stool output  Genitourinary:    Comments: No vulvar rash or erythema Multiple well-healed previous decubitus ulcers.  There is a sacral decubitus ulcer with good granulation tissue, no evidence of infection. Mild rectal prolapse without obvious hemorrhoids.  Normal rectal tone, no tenderness to palpation.  No obvious melena or hematochezia. Musculoskeletal:     Cervical back: Normal range of motion.  Skin:    General: Skin is warm and dry.  Neurological:     Mental Status: She is alert and oriented to person, place, and time.  Psychiatric:        Mood and Affect: Mood normal.        Behavior: Behavior normal.     ED Results / Procedures / Treatments   Labs (all labs ordered are listed, but only abnormal results are displayed) Labs Reviewed  COMPREHENSIVE METABOLIC PANEL  CBC WITH DIFFERENTIAL/PLATELET  URINALYSIS, ROUTINE W REFLEX MICROSCOPIC  POC OCCULT BLOOD, ED    EKG None  Radiology No results found.  Procedures Procedures (including critical care time)  Medications Ordered in ED Medications - No data to display  ED Course  I have reviewed the triage vital signs and the nursing notes.  Pertinent labs & imaging results that were available during my care of the patient were reviewed by me and considered in my medical decision making (see chart for details).    MDM Rules/Calculators/A&P                      Patient evaluated in the emergency department for dysuria.  No evidence of vulvovaginitis.  I personally ordered interpreted reviewed labs which show elevated blood glucose on CMP, large amount of leukocytes, rare bacteria elevated white blood cell count and budding yeast on urinalysis.  CBC  shows slight leukopenia of insignificant value.  I suspect that patient  most likely has a yeast cystitis.  Will treat with Diflucan.  She is received Rocephin here and will also treat prophylactically with Keflex.  I did obtain a cath urine and sent this for culture.  Patient appears otherwise appropriate for discharge at this time back to her skilled nursing facility. Final Clinical Impression(s) / ED Diagnoses Final diagnoses:  Yeast cystitis    Rx / DC Orders ED Discharge Orders    None       Margarita Mail, PA-C 05/04/20 1603    Truddie Hidden, MD 05/04/20 1734

## 2020-05-03 NOTE — ED Triage Notes (Signed)
Per EMS, Pt is coming from [facility]. Pt is complaining of rectal pain and rectal issues. Pt also states that she has a chronic UTI that she feels is not being treated. Pt was receiving antibiotics for the UTI unsure what antibiotics were used. Pt also complaining of a "knot" in her genital area.

## 2021-12-24 ENCOUNTER — Encounter (HOSPITAL_BASED_OUTPATIENT_CLINIC_OR_DEPARTMENT_OTHER): Payer: Medicare Other | Attending: Internal Medicine | Admitting: Internal Medicine

## 2021-12-24 ENCOUNTER — Other Ambulatory Visit: Payer: Self-pay

## 2021-12-24 DIAGNOSIS — E11622 Type 2 diabetes mellitus with other skin ulcer: Secondary | ICD-10-CM | POA: Diagnosis present

## 2021-12-24 DIAGNOSIS — L89320 Pressure ulcer of left buttock, unstageable: Secondary | ICD-10-CM | POA: Diagnosis not present

## 2021-12-24 DIAGNOSIS — I509 Heart failure, unspecified: Secondary | ICD-10-CM | POA: Insufficient documentation

## 2021-12-24 DIAGNOSIS — L89150 Pressure ulcer of sacral region, unstageable: Secondary | ICD-10-CM | POA: Diagnosis not present

## 2021-12-24 DIAGNOSIS — L89313 Pressure ulcer of right buttock, stage 3: Secondary | ICD-10-CM | POA: Diagnosis not present

## 2021-12-24 DIAGNOSIS — G8220 Paraplegia, unspecified: Secondary | ICD-10-CM | POA: Diagnosis not present

## 2021-12-24 DIAGNOSIS — I110 Hypertensive heart disease with heart failure: Secondary | ICD-10-CM | POA: Insufficient documentation

## 2021-12-24 DIAGNOSIS — M069 Rheumatoid arthritis, unspecified: Secondary | ICD-10-CM | POA: Diagnosis not present

## 2021-12-27 ENCOUNTER — Other Ambulatory Visit: Payer: Self-pay | Admitting: Internal Medicine

## 2021-12-27 ENCOUNTER — Other Ambulatory Visit (HOSPITAL_COMMUNITY): Payer: Self-pay | Admitting: Internal Medicine

## 2021-12-27 DIAGNOSIS — L89150 Pressure ulcer of sacral region, unstageable: Secondary | ICD-10-CM

## 2021-12-28 NOTE — Progress Notes (Signed)
EISLEY, BARBER ([id number]) Visit Report for 12/24/2021 Abuse Risk Screen Details Patient Name: Date of Service: RO JOSELLE, DEEDS 12/24/2021 9:15 A M Medical Record Number: [id number] Patient Account Number: [id number] Date of Birth/Sex: Treating RN: 1941/03/04 (81 y.o. Nancy Fetter Primary Care Ewald Beg: PA Haig Prophet, NO Other Clinician: Referring Kirklin Mcduffee: Treating Larwence Tu/Extender: Yaakov Guthrie in Treatment: 0 Abuse Risk Screen Items Answer ABUSE RISK SCREEN: Has anyone close to you tried to hurt or harm you recentlyo No Do you feel uncomfortable with anyone in your familyo No Has anyone forced you do things that you didnt want to doo No Electronic Signature(s) Signed: 12/28/2021 5:37:15 PM By: Levan Hurst RN, BSN Entered By: Levan Hurst on 12/24/2021 09:50:15 -------------------------------------------------------------------------------- Activities of Daily Living Details Patient Name: Date of Service: MIMA, CRANMORE 12/24/2021 9:15 A M Medical Record Number: [id number] Patient Account Number: [id number] Date of Birth/Sex: Treating RN: Mar 13, 1941 (81 y.o. Nancy Fetter Primary Care Jaidy Cottam: PA Haig Prophet, NO Other Clinician: Referring Jaylia Pettus: Treating Machele Deihl/Extender: Yaakov Guthrie in Treatment: 0 Activities of Daily Living Items Answer Activities of Daily Living (Please select one for each item) Drive Automobile Not Able T Medications ake Need Assistance Use T elephone Need Assistance Care for Appearance Need Assistance Use T oilet Need Assistance Bath / Shower Need Assistance Dress Self Need Assistance Feed Self Completely Able Walk Not Able Get In / Out Bed Need Assistance Housework Need [place] Need Assistance Shop for Self Need Assistance Electronic Signature(s) Signed: 12/28/2021 5:37:15 PM By: Levan Hurst RN, BSN Entered By: Levan Hurst on 12/24/2021  09:50:41 -------------------------------------------------------------------------------- Education Screening Details Patient Name: Date of Service: Renata Caprice. 12/24/2021 9:15 A M Medical Record Number: [id number] Patient Account Number: [id number] Date of Birth/Sex: Treating RN: 09/04/1941 (81 y.o. Nancy Fetter Primary Care Doll Frazee: PA Haig Prophet, NO Other Clinician: Referring Izen Petz: Treating Hussain Maimone/Extender: Yaakov Guthrie in Treatment: 0 Primary Learner Assessed: Patient Learning Preferences/Education Level/Primary Language Learning Preference: Explanation, Demonstration, Printed Material Highest Education Level: High School Preferred Language: English Cognitive Barrier Language Barrier: No Translator Needed: No Memory Deficit: No Emotional Barrier: No Cultural/Religious Beliefs Affecting Medical Care: No Physical Barrier Impaired Vision: No Impaired Hearing: No Decreased Hand dexterity: No Knowledge/Comprehension Knowledge Level: High Comprehension Level: High Ability to understand written instructions: High Ability to understand verbal instructions: High Motivation Anxiety Level: Calm Cooperation: Cooperative Education Importance: Acknowledges Need Interest in Health Problems: Asks Questions Perception: Coherent Willingness to Engage in Self-Management High Activities: Readiness to Engage in Self-Management High Activities: Electronic Signature(s) Signed: 12/28/2021 5:37:15 PM By: Levan Hurst RN, BSN Entered By: Levan Hurst on 12/24/2021 09:46:58 -------------------------------------------------------------------------------- Fall Risk Assessment Details Patient Name: Date of Service: RO Floy Sabina. 12/24/2021 9:15 A M Medical Record Number: [id number] Patient Account Number: [id number] Date of Birth/Sex: Treating RN: 12-Dec-1940 (80 y.o. Nancy Fetter Primary Care Versa Craton: PA TIENT, NO Other Clinician: Referring  Khristian Phillippi: Treating Rohn Fritsch/Extender: Yaakov Guthrie in Treatment: 0 Fall Risk Assessment Items Have you had 2 or more falls in the last 12 monthso 0 No Have you had any fall that resulted in injury in the last 12 monthso 0 No FALLS RISK SCREEN History of falling - immediate or within 3 months 0 No Secondary diagnosis (Do you have 2 or more medical diagnoseso) 15 Yes Ambulatory aid None/bed rest/wheelchair/nurse 0 Yes Crutches/cane/walker 0 No Furniture 0 No Intravenous therapy Access/Saline/Heparin Lock 0 No Gait/Transferring Normal/ bed rest/ wheelchair 0 Yes  Weak (short steps with or without shuffle, stooped but able to lift head while walking, may seek 0 No support from furniture) Impaired (short steps with shuffle, may have difficulty arising from chair, head down, impaired 0 No balance) Mental Status Oriented to own ability 0 Yes Electronic Signature(s) Signed: 12/28/2021 5:37:15 PM By: Levan Hurst RN, BSN Entered By: Levan Hurst on 12/24/2021 09:50:58 -------------------------------------------------------------------------------- Nutrition Risk Screening Details Patient Name: Date of Service: RO TURNER, KUNZMAN 12/24/2021 9:15 A M Medical Record Number: [id number] Patient Account Number: [id number] Date of Birth/Sex: Treating RN: 03/19/1941 (81 y.o. Nancy Fetter Primary Care Math Brazie: PA Haig Prophet, NO Other Clinician: Referring Braedyn Riggle: Treating Delita Chiquito/Extender: Yaakov Guthrie in Treatment: 0 Height (in): Weight (lbs): Body Mass Index (BMI): Nutrition Risk Screening Items Score Screening NUTRITION RISK SCREEN: I have an illness or condition that made me change the kind and/or amount of food I eat 0 No I eat fewer than two meals per day 0 No I eat few fruits and vegetables, or milk products 0 No I have three or more drinks of beer, liquor or wine almost every day 0 No I have tooth or mouth problems that make it hard for me to eat 0 No I  don't always have enough money to buy the food I need 0 No I eat alone most of the time 0 No I take three or more different prescribed or over-the-counter drugs a day 1 Yes Without wanting to, I have lost or gained 10 pounds in the last six months 0 No I am not always physically able to shop, cook and/or feed myself 2 Yes Nutrition Protocols Good Risk Protocol Moderate Risk Protocol 0 Provide education on nutrition High Risk Proctocol Risk Level: Moderate Risk Score: 3 Electronic Signature(s) Signed: 12/28/2021 5:37:15 PM By: Levan Hurst RN, BSN Entered By: Levan Hurst on 12/24/2021 09:46:15

## 2021-12-28 NOTE — Progress Notes (Signed)
MATEJA, DIER ([id number]) Visit Report for 12/24/2021 Chief Complaint Document Details Patient Name: Date of Service: Shannon Schmidt, Shannon Schmidt 12/24/2021 9:15 A M Medical Record Number: [id number] Patient Account Number: [id number] Date of Birth/Sex: Treating RN: 1940/12/02 (81 y.o. Nancy Fetter Primary Care Provider: PA Haig Prophet, NO Other Clinician: Referring Provider: Treating Provider/Extender: Yaakov Guthrie in Treatment: 0 Information Obtained from: Patient Chief Complaint 12/24/2021; patient presents for bilateral pressure ulcers to her buttocks and sacrum Electronic Signature(s) Signed: 12/24/2021 1:51:49 PM By: Kalman Shan DO Entered By: Kalman Shan on 12/24/2021 13:04:22 -------------------------------------------------------------------------------- Debridement Details Patient Name: Date of Service: Renata Caprice. 12/24/2021 9:15 A M Medical Record Number: [id number] Patient Account Number: [id number] Date of Birth/Sex: Treating RN: 19-Dec-1940 (81 y.o. Nancy Fetter Primary Care Provider: PA Haig Prophet, NO Other Clinician: Referring Provider: Treating Provider/Extender: Yaakov Guthrie in Treatment: 0 Debridement Performed for Assessment: Wound #10 Left Ischium Performed By: Clinician Levan Hurst, RN Debridement Type: Chemical/Enzymatic/Mechanical Agent Used: Santyl Level of Consciousness (Pre-procedure): Awake and Alert Pre-procedure Verification/Time Out No Taken: Start Time: 11:00 Bleeding: None End Time: 11:02 Procedural Pain: 0 Post Procedural Pain: 0 Response to Treatment: Procedure was tolerated well Level of Consciousness (Post- Awake and Alert procedure): Post Debridement Measurements of Total Wound Length: (cm) 3.6 Stage: Unstageable/Unclassified Width: (cm) 4.2 Depth: (cm) 0.1 Volume: (cm) 1.188 Character of Wound/Ulcer Post Debridement: Requires Further Debridement Post Procedure Diagnosis Same as  Pre-procedure Electronic Signature(s) Signed: 12/24/2021 1:51:49 PM By: Kalman Shan DO Signed: 12/28/2021 5:37:15 PM By: Levan Hurst RN, BSN Entered By: Levan Hurst on 12/24/2021 12:03:55 -------------------------------------------------------------------------------- Debridement Details Patient Name: Date of Service: Renata Caprice. 12/24/2021 9:15 A M Medical Record Number: [id number] Patient Account Number: [id number] Date of Birth/Sex: Treating RN: [birth year] (81 y.o. Nancy Fetter Primary Care Provider: PA Haig Prophet, NO Other Clinician: Referring Provider: Treating Provider/Extender: Yaakov Guthrie in Treatment: 0 Debridement Performed for Assessment: Wound #11 Sacrum Performed By: Clinician Levan Hurst, RN Debridement Type: Chemical/Enzymatic/Mechanical Agent Used: Santyl Level of Consciousness (Pre-procedure): Awake and Alert Pre-procedure Verification/Time Out No Taken: Start Time: 11:00 Bleeding: None End Time: 11:02 Procedural Pain: 0 Post Procedural Pain: 0 Response to Treatment: Procedure was tolerated well Level of Consciousness (Post- Awake and Alert procedure): Post Debridement Measurements of Total Wound Length: (cm) 6 Stage: Unstageable/Unclassified Width: (cm) 8 Depth: (cm) 2.2 Volume: (cm) 82.938 Character of Wound/Ulcer Post Debridement: Requires Further Debridement Post Procedure Diagnosis Same as Pre-procedure Electronic Signature(s) Signed: 12/24/2021 1:51:49 PM By: Kalman Shan DO Signed: 12/28/2021 5:37:15 PM By: Levan Hurst RN, BSN Entered By: Levan Hurst on 12/24/2021 12:04:25 -------------------------------------------------------------------------------- Debridement Details Patient Name: Date of Service: Renata Caprice. 12/24/2021 9:15 A M Medical Record Number: [id number] Patient Account Number: [id number] Date of Birth/Sex: Treating RN: 1941/04/26 (81 y.o. Nancy Fetter Primary Care Provider: PA  Haig Prophet, NO Other Clinician: Referring Provider: Treating Provider/Extender: Yaakov Guthrie in Treatment: 0 Debridement Performed for Assessment: Wound #12 Lateral Sacrum Performed By: Clinician Levan Hurst, RN Debridement Type: Chemical/Enzymatic/Mechanical Agent Used: Santyl Level of Consciousness (Pre-procedure): Awake and Alert Pre-procedure Verification/Time Out No Taken: Start Time: 11:00 Bleeding: None End Time: 11:02 Procedural Pain: 0 Post Procedural Pain: 0 Response to Treatment: Procedure was tolerated well Level of Consciousness (Post- Awake and Alert procedure): Post Debridement Measurements of Total Wound Length: (cm) 1.7 Stage: Unstageable/Unclassified Width: (cm) 3 Depth: (cm) 0.1 Volume: (cm) 0.401 Character of Wound/Ulcer Post Debridement: Requires Further Debridement Post Procedure Diagnosis Same as  Pre-procedure Electronic Signature(s) Signed: 12/24/2021 1:51:49 PM By: Kalman Shan DO Signed: 12/28/2021 5:37:15 PM By: Levan Hurst RN, BSN Entered By: Levan Hurst on 12/24/2021 12:04:51 -------------------------------------------------------------------------------- Debridement Details Patient Name: Date of Service: Renata Caprice. 12/24/2021 9:15 A M Medical Record Number: [id number] Patient Account Number: [id number] Date of Birth/Sex: Treating RN: 12/14/1940 (81 y.o. Nancy Fetter Primary Care Provider: PA Haig Prophet, NO Other Clinician: Referring Provider: Treating Provider/Extender: Yaakov Guthrie in Treatment: 0 Debridement Performed for Assessment: Wound #13 Right Ischium Performed By: Clinician Levan Hurst, RN Debridement Type: Chemical/Enzymatic/Mechanical Agent Used: Santyl Level of Consciousness (Pre-procedure): Awake and Alert Pre-procedure Verification/Time Out No Taken: Start Time: 11:00 Bleeding: None End Time: 11:02 Procedural Pain: 0 Post Procedural Pain: 0 Response to Treatment: Procedure was  tolerated well Level of Consciousness (Post- Awake and Alert procedure): Post Debridement Measurements of Total Wound Length: (cm) 0.6 Stage: Category/Stage III Width: (cm) 0.3 Depth: (cm) 0.2 Volume: (cm) 0.028 Character of Wound/Ulcer Post Debridement: Requires Further Debridement Post Procedure Diagnosis Same as Pre-procedure Electronic Signature(s) Signed: 12/24/2021 1:51:49 PM By: Kalman Shan DO Signed: 12/28/2021 5:37:15 PM By: Levan Hurst RN, BSN Entered By: Levan Hurst on 12/24/2021 12:05:16 -------------------------------------------------------------------------------- HPI Details Patient Name: Date of Service: Renata Caprice. 12/24/2021 9:15 A M Medical Record Number: [id number] Patient Account Number: [id number] Date of Birth/Sex: Treating RN: November 13, 1941 (81 y.o. Nancy Fetter Primary Care Provider: PA Haig Prophet, NO Other Clinician: Referring Provider: Treating Provider/Extender: Yaakov Guthrie in Treatment: 0 History of Present Illness HPI Description: Several year history of ulcer, paraplegia. Last seen 02/2015. Patient incontinent of urine and have not been able to offer much with regards to dressings due to this. States she stopped coming as there is a wound physician that is coming to her facility. However, she was discharged from their care as she missed there appointments. States she did not know she would be discharged if she cancelled her appointment. 07/20/15 Wounds clean, Dakins daily and PRN ordered. F/u 1 month. Admission 12/24/2021 Ms. Arabelle Bollig is a 81 year old female with a past medical history of paraplegia, previous pressure ulcers to her buttocks, and type 2 diabetes that presents to the clinic for nonhealing ulcers to her sacrum and buttocks. It is unclear if these ever healed from When she was here in 2016. She is using Dakin's wet-to-dry dressings along with Santyl. She resides in a facility that does her wound care. She has  been there for 17 years. She currently denies signs of infection. Electronic Signature(s) Signed: 12/24/2021 1:51:49 PM By: Kalman Shan DO Entered By: Kalman Shan on 12/24/2021 13:12:17 -------------------------------------------------------------------------------- Physical Exam Details Patient Name: Date of Service: DAEJA, HELDERMAN 12/24/2021 9:15 A M Medical Record Number: [id number] Patient Account Number: [id number] Date of Birth/Sex: Treating RN: 23-Dec-1940 (81 y.o. Nancy Fetter Primary Care Provider: PA Haig Prophet, NO Other Clinician: Referring Provider: Treating Provider/Extender: Yaakov Guthrie in Treatment: 0 Constitutional respirations regular, non-labored and within target range for patient.Marland Kitchen Psychiatric pleasant and cooperative. Notes Sacrum: 2 wounds 1 is large with nonviable tissue. The appearance is pale, appears unhealthy and close to bone Left buttocks: Slough throughout the wound bed with surrounding epithelialization Right buttocks: Slough throughout the wound bed Electronic Signature(s) Signed: 12/24/2021 1:51:49 PM By: Kalman Shan DO Entered By: Kalman Shan on 12/24/2021 13:45:01 -------------------------------------------------------------------------------- Physician Orders Details Patient Name: Date of Service: Renata Caprice. 12/24/2021 9:15 A M Medical Record Number: [id number] Patient Account Number: [id number] Date of  Birth/Sex: Treating RN: 12-28-1940 (81 y.o. Nancy Fetter Primary Care Provider: PA Haig Prophet, NO Other Clinician: Referring Provider: Treating Provider/Extender: Yaakov Guthrie in Treatment: 0 Verbal / Phone Orders: No Diagnosis Coding Follow-up Appointments ppointment in 2 weeks. - Dr. Heber [place] ****STETCHER**** Return A Bathing/ Shower/ Hygiene May shower with protection but do not get wound dressing(s) wet. Off-Loading Low air-loss mattress (Group 2) Turn and reposition every 2  hours Wound Treatment Wound #10 - Ischium Wound Laterality: Left Cleanser: Wound Cleanser 1 x Per Day/15 Days Discharge Instructions: Cleanse the wound with wound cleanser prior to applying a clean dressing using gauze sponges, not tissue or cotton balls. Peri-Wound Care: Skin Prep 1 x Per Day/15 Days Discharge Instructions: Use skin prep as directed Prim Dressing: Santyl Ointment 1 x Per Day/15 Days ary Discharge Instructions: Apply nickel thick amount to wound bed as instructed Secondary Dressing: MPM Excel SAP Bordered Dressing, 7x6.7 (Sacral) (in/in) 1 x Per Day/15 Days Discharge Instructions: Apply silicone border over primary dressing as directed. Wound #11 - Sacrum Cleanser: Wound Cleanser 1 x Per Day/15 Days Discharge Instructions: Cleanse the wound with wound cleanser prior to applying a clean dressing using gauze sponges, not tissue or cotton balls. Peri-Wound Care: Skin Prep 1 x Per Day/15 Days Discharge Instructions: Use skin prep as directed Prim Dressing: Santyl Ointment 1 x Per Day/15 Days ary Discharge Instructions: Apply nickel thick amount to wound bed as instructed Prim Dressing: Dakin's Solution 0.125%, 16 (oz) 1 x Per Day/15 Days ary Discharge Instructions: Apply moistened gauze over Santyl Secondary Dressing: MPM Excel SAP Bordered Dressing, 7x6.7 (Sacral) (in/in) 1 x Per Day/15 Days Discharge Instructions: Apply silicone border over primary dressing as directed. Wound #12 - Sacrum Wound Laterality: Lateral Cleanser: Wound Cleanser 1 x Per Day/15 Days Discharge Instructions: Cleanse the wound with wound cleanser prior to applying a clean dressing using gauze sponges, not tissue or cotton balls. Peri-Wound Care: Skin Prep 1 x Per Day/15 Days Discharge Instructions: Use skin prep as directed Prim Dressing: Santyl Ointment 1 x Per Day/15 Days ary Discharge Instructions: Apply nickel thick amount to wound bed as instructed Secondary Dressing: MPM Excel SAP Bordered  Dressing, 7x6.7 (Sacral) (in/in) 1 x Per Day/15 Days Discharge Instructions: Apply silicone border over primary dressing as directed. Wound #13 - Ischium Wound Laterality: Right Cleanser: Wound Cleanser 1 x Per Day/15 Days Discharge Instructions: Cleanse the wound with wound cleanser prior to applying a clean dressing using gauze sponges, not tissue or cotton balls. Peri-Wound Care: Skin Prep 1 x Per Day/15 Days Discharge Instructions: Use skin prep as directed Prim Dressing: Santyl Ointment 1 x Per Day/15 Days ary Discharge Instructions: Apply nickel thick amount to wound bed as instructed Secondary Dressing: MPM Excel SAP Bordered Dressing, 7x6.7 (Sacral) (in/in) 1 x Per Day/15 Days Discharge Instructions: Apply silicone border over primary dressing as directed. Radiology MRI of pelvis with and without contrast - Multiple non healing pressure ulcers on sacrum, rule out Osteomyelitis Electronic Signature(s) Signed: 12/24/2021 1:51:49 PM By: Kalman Shan DO Entered By: Kalman Shan on 12/24/2021 13:45:29 Prescription 12/24/2021 -------------------------------------------------------------------------------- Howell Pringle K. Kalman Shan DO Patient Name: Provider: [birth year] [id number] Date of Birth: NPI#: [id number] Sex: DEA #: [phone] Phone #: License #: [employer] Patient Address: [address] [place], [place] [address] [place], [place] [address] [phone] Allergies ACE Inhibitors; chlorpromazine; haloperidol decanoate; Augmentin Provider's Orders MRI of pelvis with and without contrast - Multiple non  healing pressure ulcers on sacrum, rule out Osteomyelitis Hand Signature: Date(s): Electronic Signature(s) Signed: 12/24/2021 1:51:49 PM By: Kalman Shan DO Entered By: Kalman Shan on 12/24/2021  13:45:29 -------------------------------------------------------------------------------- Problem List Details Patient Name: Date of Service: Renata Caprice. 12/24/2021 9:15 A M Medical Record Number: [id number] Patient Account Number: [id number] Date of Birth/Sex: Treating RN: [birth year] (81 y.o. Nancy Fetter Primary Care Provider: PA Haig Prophet, NO Other Clinician: Referring Provider: Treating Provider/Extender: Yaakov Guthrie in Treatment: 0 Active Problems ICD-10 Encounter Code Description Active Date MDM Diagnosis L89.320 Pressure ulcer of left buttock, unstageable 12/24/2021 No Yes L89.150 Pressure ulcer of sacral region, unstageable 12/24/2021 No Yes L89.313 Pressure ulcer of right buttock, stage 3 12/24/2021 No Yes Inactive Problems Resolved Problems Electronic Signature(s) Signed: 12/24/2021 1:51:49 PM By: Kalman Shan DO Entered By: Kalman Shan on 12/24/2021 12:36:46 -------------------------------------------------------------------------------- Progress Note Details Patient Name: Date of Service: Renata Caprice. 12/24/2021 9:15 A M Medical Record Number: [id number] Patient Account Number: [id number] Date of Birth/Sex: Treating RN: 1941/08/03 (81 y.o. Nancy Fetter Primary Care Provider: PA Darnelle Spangle Other Clinician: Referring Provider: Treating Provider/Extender: Yaakov Guthrie in Treatment: 0 Subjective Chief Complaint Information obtained from Patient 12/24/2021; patient presents for bilateral pressure ulcers to her buttocks and sacrum History of Present Illness (HPI) Several year history of ulcer, paraplegia. Last seen 02/2015. Patient incontinent of urine and have not been able to offer much with regards to dressings due to this. States she stopped coming as there is a wound physician that is coming to her facility. However, she was discharged from their care as she missed there appointments. States she did not know she would be  discharged if she cancelled her appointment. 07/20/15 Wounds clean, Dakins daily and PRN ordered. F/u 1 month. Admission 12/24/2021 Ms. Haylei Cobin is a 81 year old female with a past medical history of paraplegia, previous pressure ulcers to her buttocks, and type 2 diabetes that presents to the clinic for nonhealing ulcers to her sacrum and buttocks. It is unclear if these ever healed from When she was here in 2016. She is using Dakin's wet-to-dry dressings along with Santyl. She resides in a facility that does her wound care. She has been there for 17 years. She currently denies signs of infection. Patient History Information obtained from Patient, Chart. Allergies ACE Inhibitors (Severity: Moderate), chlorpromazine (Severity: Moderate), haloperidol decanoate (Severity: Moderate), Augmentin Family History Unknown History. Social History Never smoker, Alcohol Use - Never, Drug Use - No History, Caffeine Use - Rarely. Medical History Eyes Patient has history of Glaucoma Respiratory Patient has history of Chronic Obstructive Pulmonary Disease (COPD) Cardiovascular Patient has history of Congestive Heart Failure, Hypertension Endocrine Patient has history of Type II Diabetes Musculoskeletal Patient has history of Rheumatoid Arthritis Neurologic Patient has history of Paraplegia Patient is treated with Insulin. Blood sugar is tested. Medical A Surgical History Notes nd Gastrointestinal Colostomy Psychiatric Schizophrenia, Bipolar Review of Systems (ROS) Constitutional Symptoms (General Health) Denies complaints or symptoms of Fatigue, Fever, Chills, Marked Weight Change. Ear/Nose/Mouth/Throat Denies complaints or symptoms of Chronic sinus problems or rhinitis. Respiratory Denies complaints or symptoms of Chronic or frequent coughs, Shortness of Breath. Gastrointestinal Denies complaints or symptoms of Frequent diarrhea, Nausea, Vomiting. Genitourinary Denies complaints or  symptoms of Frequent urination. Integumentary (Skin) Complains or has symptoms of Wounds - multiple wounds on sacrum and bilateral ischium. Objective Constitutional respirations regular, non-labored and within target range for patient.. Vitals Time Taken: 9:23 AM, Temperature: 97.7 F, Pulse: 83  bpm, Respiratory Rate: 16 breaths/min, Blood Pressure: 97/63 mmHg. Psychiatric pleasant and cooperative. General Notes: Sacrum: 2 wounds 1 is large with nonviable tissue. The appearance is pale, appears unhealthy and close to bone Left buttocks: Slough throughout the wound bed with surrounding epithelialization Right buttocks: Slough throughout the wound bed Integumentary (Hair, Skin) Wound #10 status is Open. Original cause of wound was Pressure Injury. The date acquired was: 04/28/2021. The wound is located on the Left Ischium. The wound measures 3.6cm length x 4.2cm width x 0.1cm depth; 11.875cm^2 area and 1.188cm^3 volume. There is no tunneling or undermining noted. There is a medium amount of serous drainage noted. The wound margin is flat and intact. There is no granulation within the wound bed. There is a large (67-100%) amount of necrotic tissue within the wound bed including Adherent Slough. Wound #11 status is Open. Original cause of wound was Pressure Injury. The date acquired was: 04/28/2021. The wound is located on the Sacrum. The wound measures 6cm length x 8cm width x 2.2cm depth; 37.699cm^2 area and 82.938cm^3 volume. There is Fat Layer (Subcutaneous Tissue) exposed. There is no tunneling noted, however, there is undermining starting at 10:00 and ending at 12:00 with a maximum distance of 1.4cm. There is a large amount of serous drainage noted. The wound margin is well defined and not attached to the wound base. There is small (1-33%) pink, pale granulation within the wound bed. There is a large (67-100%) amount of necrotic tissue within the wound bed including Adherent Slough. Wound #12  status is Open. Original cause of wound was Pressure Injury. The date acquired was: 04/28/2021. The wound is located on the Lateral Sacrum. The wound measures 1.7cm length x 3cm width x 0.1cm depth; 4.006cm^2 area and 0.401cm^3 volume. There is no tunneling or undermining noted. There is a medium amount of serous drainage noted. The wound margin is flat and intact. There is no granulation within the wound bed. There is a large (67-100%) amount of necrotic tissue within the wound bed including Eschar and Adherent Slough. Wound #13 status is Open. Original cause of wound was Pressure Injury. The date acquired was: 04/28/2021. The wound is located on the Right Ischium. The wound measures 0.6cm length x 0.3cm width x 0.2cm depth; 0.141cm^2 area and 0.028cm^3 volume. There is Fat Layer (Subcutaneous Tissue) exposed. There is no tunneling or undermining noted. There is a small amount of serosanguineous drainage noted. The wound margin is distinct with the outline attached to the wound base. There is large (67-100%) pink, pale granulation within the wound bed. There is a small (1-33%) amount of necrotic tissue within the wound bed including Adherent Slough. Assessment Active Problems ICD-10 Pressure ulcer of left buttock, unstageable Pressure ulcer of sacral region, unstageable Pressure ulcer of right buttock, stage 3 Patient presents with nonhealing ulcers to her buttocks and sacrum. All tissue appears unhealthy and nonviable. I recommended an MRI to assess for osteomyelitis. No surrounding signs of infection. I recommended Dakin's wet-to-dry dressings And Santyl to the areas of slough. We had a long discussion about the importance of aggressive offloading in wound healing. She expressed understanding. Follow-up in 2 weeks. 47 minutes was spent on the encounter including face-to-face, EMR review and coordination of care Procedures Wound #10 Pre-procedure diagnosis of Wound #10 is a Pressure Ulcer located  on the Left Ischium . There was a Chemical/Enzymatic/Mechanical debridement performed by Levan Hurst, RN.Marland Kitchen Agent used was [employer]. There was no bleeding. The procedure was tolerated well with a pain  level of 0 throughout and a pain level of 0 following the procedure. Post Debridement Measurements: 3.6cm length x 4.2cm width x 0.1cm depth; 1.188cm^3 volume. Post debridement Stage noted as Unstageable/Unclassified. Character of Wound/Ulcer Post Debridement requires further debridement. Post procedure Diagnosis Wound #10: Same as Pre-Procedure Wound #11 Pre-procedure diagnosis of Wound #11 is a Pressure Ulcer located on the Sacrum . There was a Chemical/Enzymatic/Mechanical debridement performed by Levan Hurst, RN.Marland Kitchen Agent used was [employer]. There was no bleeding. The procedure was tolerated well with a pain level of 0 throughout and a pain level of 0 following the procedure. Post Debridement Measurements: 6cm length x 8cm width x 2.2cm depth; 82.938cm^3 volume. Post debridement Stage noted as Unstageable/Unclassified. Character of Wound/Ulcer Post Debridement requires further debridement. Post procedure Diagnosis Wound #11: Same as Pre-Procedure Wound #12 Pre-procedure diagnosis of Wound #12 is a Pressure Ulcer located on the Lateral Sacrum . There was a Chemical/Enzymatic/Mechanical debridement performed by Levan Hurst, RN.Marland Kitchen Agent used was [employer]. There was no bleeding. The procedure was tolerated well with a pain level of 0 throughout and a pain level of 0 following the procedure. Post Debridement Measurements: 1.7cm length x 3cm width x 0.1cm depth; 0.401cm^3 volume. Post debridement Stage noted as Unstageable/Unclassified. Character of Wound/Ulcer Post Debridement requires further debridement. Post procedure Diagnosis Wound #12: Same as Pre-Procedure Wound #13 Pre-procedure diagnosis of Wound #13 is a Pressure Ulcer located on the Right Ischium . There was a Chemical/Enzymatic/Mechanical  debridement performed by Levan Hurst, RN.Marland Kitchen Agent used was [employer]. There was no bleeding. The procedure was tolerated well with a pain level of 0 throughout and a pain level of 0 following the procedure. Post Debridement Measurements: 0.6cm length x 0.3cm width x 0.2cm depth; 0.028cm^3 volume. Post debridement Stage noted as Category/Stage III. Character of Wound/Ulcer Post Debridement requires further debridement. Post procedure Diagnosis Wound #13: Same as Pre-Procedure Plan Follow-up Appointments: Return Appointment in 2 weeks. - Dr. Heber [place] ****STETCHER**** Bathing/ Shower/ Hygiene: May shower with protection but do not get wound dressing(s) wet. Off-Loading: Low air-loss mattress (Group 2) Turn and reposition every 2 hours Radiology ordered were: MRI of pelvis with and without contrast - Multiple non healing pressure ulcers on sacrum, rule out Osteomyelitis WOUND #10: - Ischium Wound Laterality: Left Cleanser: Wound Cleanser 1 x Per Day/15 Days Discharge Instructions: Cleanse the wound with wound cleanser prior to applying a clean dressing using gauze sponges, not tissue or cotton balls. Peri-Wound Care: Skin Prep 1 x Per Day/15 Days Discharge Instructions: Use skin prep as directed Prim Dressing: Santyl Ointment 1 x Per Day/15 Days ary Discharge Instructions: Apply nickel thick amount to wound bed as instructed Secondary Dressing: MPM Excel SAP Bordered Dressing, 7x6.7 (Sacral) (in/in) 1 x Per Day/15 Days Discharge Instructions: Apply silicone border over primary dressing as directed. WOUND #11: - Sacrum Wound Laterality: Cleanser: Wound Cleanser 1 x Per Day/15 Days Discharge Instructions: Cleanse the wound with wound cleanser prior to applying a clean dressing using gauze sponges, not tissue or cotton balls. Peri-Wound Care: Skin Prep 1 x Per Day/15 Days Discharge Instructions: Use skin prep as directed Prim Dressing: Santyl Ointment 1 x Per Day/15 Days ary Discharge  Instructions: Apply nickel thick amount to wound bed as instructed Prim Dressing: Dakin's Solution 0.125%, 16 (oz) 1 x Per Day/15 Days ary Discharge Instructions: Apply moistened gauze over Santyl Secondary Dressing: MPM Excel SAP Bordered Dressing, 7x6.7 (Sacral) (in/in) 1 x Per Day/15 Days Discharge Instructions: Apply silicone border over primary dressing as directed. WOUND #  12: - Sacrum Wound Laterality: Lateral Cleanser: Wound Cleanser 1 x Per Day/15 Days Discharge Instructions: Cleanse the wound with wound cleanser prior to applying a clean dressing using gauze sponges, not tissue or cotton balls. Peri-Wound Care: Skin Prep 1 x Per Day/15 Days Discharge Instructions: Use skin prep as directed Prim Dressing: Santyl Ointment 1 x Per Day/15 Days ary Discharge Instructions: Apply nickel thick amount to wound bed as instructed Secondary Dressing: MPM Excel SAP Bordered Dressing, 7x6.7 (Sacral) (in/in) 1 x Per Day/15 Days Discharge Instructions: Apply silicone border over primary dressing as directed. WOUND #13: - Ischium Wound Laterality: Right Cleanser: Wound Cleanser 1 x Per Day/15 Days Discharge Instructions: Cleanse the wound with wound cleanser prior to applying a clean dressing using gauze sponges, not tissue or cotton balls. Peri-Wound Care: Skin Prep 1 x Per Day/15 Days Discharge Instructions: Use skin prep as directed Prim Dressing: Santyl Ointment 1 x Per Day/15 Days ary Discharge Instructions: Apply nickel thick amount to wound bed as instructed Secondary Dressing: MPM Excel SAP Bordered Dressing, 7x6.7 (Sacral) (in/in) 1 x Per Day/15 Days Discharge Instructions: Apply silicone border over primary dressing as directed. 1. Dakin's wet-to-dry 2. Santyl to the areas of slough 3. MRI of the pelvis 4. Follow-up in 2 weeks Electronic Signature(s) Signed: 12/24/2021 1:51:49 PM By: Kalman Shan DO Entered By: Kalman Shan on 12/24/2021  13:50:30 -------------------------------------------------------------------------------- HxROS Details Patient Name: Date of Service: Renata Caprice. 12/24/2021 9:15 A M Medical Record Number: [id number] Patient Account Number: [id number] Date of Birth/Sex: Treating RN: August 07, 1941 (81 y.o. Nancy Fetter Primary Care Provider: PA Haig Prophet, NO Other Clinician: Referring Provider: Treating Provider/Extender: Yaakov Guthrie in Treatment: 0 Information Obtained From Patient Chart Constitutional Symptoms (General Health) Complaints and Symptoms: Negative for: Fatigue; Fever; Chills; Marked Weight Change Ear/Nose/Mouth/Throat Complaints and Symptoms: Negative for: Chronic sinus problems or rhinitis Respiratory Complaints and Symptoms: Negative for: Chronic or frequent coughs; Shortness of Breath Medical History: Positive for: Chronic Obstructive Pulmonary Disease (COPD) Gastrointestinal Complaints and Symptoms: Negative for: Frequent diarrhea; Nausea; Vomiting Medical History: Past Medical History Notes: Colostomy Genitourinary Complaints and Symptoms: Negative for: Frequent urination Integumentary (Skin) Complaints and Symptoms: Positive for: Wounds - multiple wounds on sacrum and bilateral ischium Eyes Medical History: Positive for: Glaucoma Cardiovascular Medical History: Positive for: Congestive Heart Failure; Hypertension Endocrine Medical History: Positive for: Type II Diabetes Treated with: Insulin Blood sugar tested every day: Yes Tested : Immunological Musculoskeletal Medical History: Positive for: Rheumatoid Arthritis Neurologic Medical History: Positive for: Paraplegia Oncologic Psychiatric Medical History: Past Medical History Notes: Schizophrenia, Bipolar HBO Extended History Items Eyes: Glaucoma Immunizations Pneumococcal Vaccine: Received Pneumococcal Vaccination: Yes Received Pneumococcal Vaccination On or After 60th Birthday:  Yes Implantable Devices None Family and Social History Unknown History: Yes; Never smoker; Alcohol Use: Never; Drug Use: No History; Caffeine Use: Rarely; Financial Concerns: No; Food, Clothing or Shelter Needs: No; Support System Lacking: No; Transportation Concerns: No Electronic Signature(s) Signed: 12/24/2021 1:51:49 PM By: Kalman Shan DO Signed: 12/28/2021 5:37:15 PM By: Levan Hurst RN, BSN Entered By: Levan Hurst on 12/24/2021 09:50:08 -------------------------------------------------------------------------------- [place] Details Patient Name: Date of Service: Renata Caprice 12/24/2021 Medical Record Number: [id number] Patient Account Number: [id number] Date of Birth/Sex: Treating RN: Feb 24, 1941 (81 y.o. 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, stage 3 Facility Procedures CPT4 Code: [id number] Description: 50354 - WOUND CARE VISIT-LEV 3 EST PT Modifier: 25 Quantity: 1 CPT4 Code: [id number] Description: 17001 - DEBRIDE W/O ANES NON SELECT Modifier: Quantity: 1 Physician Procedures : CPT4 Code Description Modifier [id number] - WC PHYS LEVEL 4 - NEW PT ICD-10 Diagnosis Description L89.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

## 2021-12-29 NOTE — Progress Notes (Signed)
Shannon Schmidt, Shannon Schmidt ([id number]) Visit Report for 12/24/2021 Allergy List Details Patient Name: Date of Service: Shannon Schmidt, Shannon Schmidt 12/24/2021 9:15 A M Medical Record Number: [id number] Patient Account Number: [id number] Date of Birth/Sex: Treating RN: [birth year] (81 y.o. Nancy Fetter Primary Care Raizel Wesolowski: PA Haig Prophet, NO Other Clinician: Referring Malon Branton: Treating Oneka Parada/Extender: Yaakov Guthrie in Treatment: 0 Allergies Active Allergies ACE Inhibitors Severity: Moderate chlorpromazine Severity: Moderate haloperidol decanoate Severity: Moderate Augmentin Allergy Notes Electronic Signature(s) Signed: 12/28/2021 5:37:15 PM By: Levan Hurst RN, BSN Entered By: Levan Hurst on 12/24/2021 09:47:30 -------------------------------------------------------------------------------- Arrival Information Details Patient Name: Date of Service: Shannon Schmidt. 12/24/2021 9:15 A M Medical Record Number: [id number] Patient Account Number: [id number] Date of Birth/Sex: Treating RN: January 06, 1941 (81 y.o. Nancy Fetter Primary Care Lenny Bouchillon: PA TIENT, NO Other Clinician: Referring Clayvon Parlett: Treating Lauro Manlove/Extender: Yaakov Guthrie in Treatment: 0 Visit Information Patient Arrived: Stretcher Arrival Time: 09:23 Accompanied By: caregiver Transfer Assistance: Stretcher Patient Identification Verified: Yes Secondary Verification Process Completed: Yes Patient Requires Transmission-Based Precautions: No Patient Has Alerts: No History Since Last Visit Added or deleted any medications: No Any new allergies or adverse reactions: No Had a fall or experienced change in activities of daily living that may affect risk of falls: No Signs or symptoms of abuse/neglect since last visito No Hospitalized since last visit: No Implantable device outside of the clinic excluding cellular tissue based products placed in the center since last visit: No Pain Present Now:  No Electronic Signature(s) Signed: 12/28/2021 5:37:15 PM By: Levan Hurst RN, BSN Entered By: Levan Hurst on 12/24/2021 09:23:24 -------------------------------------------------------------------------------- Clinic Level of Care Assessment Details Patient Name: Date of Service: Shannon Schmidt, Shannon Schmidt 12/24/2021 9:15 A M Medical Record Number: [id number] Patient Account Number: [id number] Date of Birth/Sex: Treating RN: 19-Apr-1941 (81 y.o. Nancy Fetter Primary Care Lakin Rhine: PA Haig Prophet, NO Other Clinician: Referring Mayu Ronk: Treating Londin Antone/Extender: Yaakov Guthrie in Treatment: 0 Clinic Level of Care Assessment Items TOOL 1 Quantity Score X- 1 0 Use when EandM and Procedure is performed on INITIAL visit ASSESSMENTS - Nursing Assessment / Reassessment X- 1 20 General Physical Exam (combine w/ comprehensive assessment (listed just below) when performed on new pt. evals) X- 1 25 Comprehensive Assessment (HX, ROS, Risk Assessments, Wounds Hx, etc.) ASSESSMENTS - Wound and Skin Assessment / Reassessment []  - 0 Dermatologic / Skin Assessment (not related to wound area) ASSESSMENTS - Ostomy and/or Continence Assessment and Care []  - 0 Incontinence Assessment and Management []  - 0 Ostomy Care Assessment and Management (repouching, etc.) PROCESS - Coordination of Care X - Simple Patient / Family Education for ongoing care 1 15 []  - 0 Complex (extensive) Patient / Family Education for ongoing care X- 1 10 Staff obtains Programmer, systems, Records, T Results / Process Orders est X- 1 10 Staff telephones HHA, Nursing Homes / Clarify orders / etc []  - 0 Routine Transfer to another Facility (non-emergent condition) []  - 0 Routine Hospital Admission (non-emergent condition) X- 1 15 New Admissions / Biomedical engineer / Ordering NPWT Apligraf, etc. , []  - 0 Emergency Hospital Admission (emergent condition) PROCESS - Special Needs []  - 0 Pediatric / Minor Patient  Management []  - 0 Isolation Patient Management []  - 0 Hearing / Language / Visual special needs []  - 0 Assessment of Community assistance (transportation, D/C planning, etc.) []  - 0 Additional assistance / Altered mentation []  - 0 Support Surface(s) Assessment (bed, cushion, seat, etc.) INTERVENTIONS - Miscellaneous []  - 0 External ear exam []  -  0 Patient Transfer (multiple staff / Civil Service fast streamer / Similar devices) []  - 0 Simple Staple / Suture removal (25 or less) []  - 0 Complex Staple / Suture removal (26 or more) []  - 0 Hypo/Hyperglycemic Management (do not check if billed separately) []  - 0 Ankle / Brachial Index (ABI) - do not check if billed separately Has the patient been seen at the hospital within the last three years: Yes Total Score: 95 Level Of Care: New/Established - Level 3 Electronic Signature(s) Signed: 12/28/2021 5:37:15 PM By: Levan Hurst RN, BSN Entered By: Levan Hurst on 12/24/2021 12:06:00 -------------------------------------------------------------------------------- Encounter Discharge Information Details Patient Name: Date of Service: Shannon Schmidt. 12/24/2021 9:15 A M Medical Record Number: [id number] Patient Account Number: [id number] Date of Birth/Sex: Treating RN: 06-07-1941 (81 y.o. Nancy Fetter Primary Care Leylah Tarnow: PA Haig Prophet, NO Other Clinician: Referring Kyser Wandel: Treating Maude Hettich/Extender: Yaakov Guthrie in Treatment: 0 Encounter Discharge Information Items Post Procedure Vitals Discharge Condition: Stable Temperature (F): 97.7 Ambulatory Status: Stretcher Pulse (bpm): 83 Discharge Destination: Home Respiratory Rate (breaths/min): 16 Transportation: Private Auto Blood Pressure (mmHg): 97/63 Accompanied By: caregiver Schedule Follow-up Appointment: Yes Clinical Summary of Care: Patient Declined Electronic Signature(s) Signed: 12/28/2021 5:37:15 PM By: Levan Hurst RN, BSN Entered By: Levan Hurst on  12/24/2021 12:07:21 -------------------------------------------------------------------------------- Multi Wound Chart Details Patient Name: Date of Service: Shannon Schmidt. 12/24/2021 9:15 A M Medical Record Number: [id number] Patient Account Number: [id number] Date of Birth/Sex: Treating RN: Feb 15, 1941 (81 y.o. Nancy Fetter Primary Care Fama Muenchow: PA TIENT, NO Other Clinician: Referring Jaquila Santelli: Treating Holton Sidman/Extender: Yaakov Guthrie in Treatment: 0 Vital Signs Height(in): Pulse(bpm): 83 Weight(lbs): Blood Pressure(mmHg): 97/63 Body Mass Index(BMI): Temperature(F): 97.7 Respiratory Rate(breaths/min): 16 Photos: Left Ischium Sacrum Lateral Sacrum Wound Location: Pressure Injury Pressure Injury Pressure Injury Wounding Event: Pressure Ulcer Pressure Ulcer Pressure Ulcer Primary Etiology: 04/28/2021 04/28/2021 04/28/2021 Date Acquired: 0 0 0 Weeks of Treatment: Open Open Open Wound Status: No No No Wound Recurrence: 3.6x4.2x0.1 6x8x2.2 1.7x3x0.1 Measurements L x W x D (cm) 11.875 37.699 4.006 A (cm) : rea 1.188 82.938 0.401 Volume (cm) : 0.00% 0.00% 0.00% % Reduction in A rea: 0.00% 0.00% 0.00% % Reduction in Volume: 10 Starting Position 1 (o'clock): 12 Ending Position 1 (o'clock): 1.4 Maximum Distance 1 (cm): No Yes No Undermining: Unstageable/Unclassified Unstageable/Unclassified Unstageable/Unclassified Classification: Medium Large Medium Exudate A mount: Serous Serous Serous Exudate Type: amber amber amber Exudate Color: Flat and Intact Well defined, not attached Flat and Intact Wound Margin: None Present (0%) Small (1-33%) None Present (0%) Granulation A mount: N/A Pink, Pale N/A Granulation Quality: Large (67-100%) Large (67-100%) Large (67-100%) Necrotic A mount: Adherent Slough Adherent [facility], Adherent Slough Necrotic Tissue: Fascia: No Fat Layer (Subcutaneous Tissue): Yes Fascia: No Exposed Structures: Fat  Layer (Subcutaneous Tissue): No Fascia: No Fat Layer (Subcutaneous Tissue): No Tendon: No Tendon: No Tendon: No Muscle: No Muscle: No Muscle: No Joint: No Joint: No Joint: No Bone: No Bone: No Bone: No None None None Epithelialization: Chemical/Enzymatic/Mechanical Chemical/Enzymatic/Mechanical Chemical/Enzymatic/Mechanical Debridement: N/A N/A N/A Instrument: None None None Bleeding: 0 0 0 Procedural Pain: 0 0 0 Post Procedural Pain: Debridement Treatment Response: Procedure was tolerated well Procedure was tolerated well Procedure was tolerated well Post Debridement Measurements L x 3.6x4.2x0.1 6x8x2.2 1.7x3x0.1 W x D (cm) 1.188 82.938 0.401 Post Debridement Volume: (cm) Unstageable/Unclassified Unstageable/Unclassified Unstageable/Unclassified Post Debridement Stage: Debridement Debridement Debridement Procedures Performed: Wound Number: 13 N/A N/A Photos: N/A N/A Right Ischium N/A N/A Wound Location: Pressure Injury  N/A N/A Wounding Event: Pressure Ulcer N/A N/A Primary Etiology: 04/28/2021 N/A N/A Date Acquired: 0 N/A N/A Weeks of Treatment: Open N/A N/A Wound Status: No N/A N/A Wound Recurrence: 0.6x0.3x0.2 N/A N/A Measurements L x W x D (cm) 0.141 N/A N/A A (cm) : rea 0.028 N/A N/A Volume (cm) : 0.00% N/A N/A % Reduction in A rea: 0.00% N/A N/A % Reduction in Volume: No N/A N/A Undermining: Category/Stage III N/A N/A Classification: Small N/A N/A Exudate A mount: Serosanguineous N/A N/A Exudate Type: red, brown N/A N/A Exudate Color: Distinct, outline attached N/A N/A Wound Margin: Large (67-100%) N/A N/A Granulation A mount: Pink, Pale N/A N/A Granulation Quality: Small (1-33%) N/A N/A Necrotic A mount: Adherent Slough N/A N/A Necrotic Tissue: Fat Layer (Subcutaneous Tissue): Yes N/A N/A Exposed Structures: Fascia: No Tendon: No Muscle: No Joint: No Bone: No None N/A N/A Epithelialization: Chemical/Enzymatic/Mechanical  N/A N/A Debridement: N/A N/A N/A Instrument: None N/A N/A Bleeding: 0 N/A N/A Procedural Pain: 0 N/A N/A Post Procedural Pain: Procedure was tolerated well N/A N/A Debridement Treatment Response: 0.6x0.3x0.2 N/A N/A Post Debridement Measurements L x W x D (cm) 0.028 N/A N/A Post Debridement Volume: (cm) Category/Stage III N/A N/A Post Debridement Stage: Debridement N/A N/A Procedures Performed: Treatment Notes Wound #10 (Ischium) Wound Laterality: Left Cleanser Wound Cleanser Discharge Instruction: Cleanse the wound with wound cleanser prior to applying a clean dressing using gauze sponges, not tissue or cotton balls. Peri-Wound Care Skin Prep Discharge Instruction: Use skin prep as directed Topical Primary Dressing Santyl Ointment Discharge Instruction: Apply nickel thick amount to wound bed as instructed Secondary Dressing MPM Excel SAP Bordered Dressing, 7x6.7 (Sacral) (in/in) Discharge Instruction: Apply silicone border over primary dressing as directed. Secured With Compression Wrap Compression Stockings Add-Ons Wound #11 (Sacrum) Cleanser Wound Cleanser Discharge Instruction: Cleanse the wound with wound cleanser prior to applying a clean dressing using gauze sponges, not tissue or cotton balls. Peri-Wound Care Skin Prep Discharge Instruction: Use skin prep as directed Topical Primary Dressing Santyl Ointment Discharge Instruction: Apply nickel thick amount to wound bed as instructed Dakin's Solution 0.125%, 16 (oz) Discharge Instruction: Apply moistened gauze over Santyl Secondary Dressing MPM Excel SAP Bordered Dressing, 7x6.7 (Sacral) (in/in) Discharge Instruction: Apply silicone border over primary dressing as directed. Secured With Compression Wrap Compression Stockings Add-Ons Wound #12 (Sacrum) Wound Laterality: Lateral Cleanser Wound Cleanser Discharge Instruction: Cleanse the wound with wound cleanser prior to applying a clean dressing  using gauze sponges, not tissue or cotton balls. Peri-Wound Care Skin Prep Discharge Instruction: Use skin prep as directed Topical Primary Dressing Santyl Ointment Discharge Instruction: Apply nickel thick amount to wound bed as instructed Secondary Dressing MPM Excel SAP Bordered Dressing, 7x6.7 (Sacral) (in/in) Discharge Instruction: Apply silicone border over primary dressing as directed. Secured With Compression Wrap Compression Stockings Add-Ons Wound #13 (Ischium) Wound Laterality: Right Cleanser Wound Cleanser Discharge Instruction: Cleanse the wound with wound cleanser prior to applying a clean dressing using gauze sponges, not tissue or cotton balls. Peri-Wound Care Skin Prep Discharge Instruction: Use skin prep as directed Topical Primary Dressing Santyl Ointment Discharge Instruction: Apply nickel thick amount to wound bed as instructed Secondary Dressing MPM Excel SAP Bordered Dressing, 7x6.7 (Sacral) (in/in) Discharge Instruction: Apply silicone border over primary dressing as directed. Secured With Compression Wrap Compression Stockings Environmental education officer) Signed: 12/24/2021 1:51:49 PM By: Kalman Shan DO Signed: 12/28/2021 5:37:15 PM By: Levan Hurst RN, BSN Entered By: Kalman Shan on 12/24/2021 12:41:48 -------------------------------------------------------------------------------- Multi-Disciplinary Care Plan Details Patient Name:  Date of Service: Shannon Schmidt, Shannon Schmidt 12/24/2021 9:15 A M Medical Record Number: [id number] Patient Account Number: [id number] Date of Birth/Sex: Treating RN: [birth year] (81 y.o. Nancy Fetter Primary Care Jasiyah Poland: PA Haig Prophet, NO Other Clinician: Referring Ashaya Raftery: Treating Quadarius Henton/Extender: Yaakov Guthrie in Treatment: 0 Multidisciplinary Care Plan reviewed with physician Active Inactive Nutrition Nursing Diagnoses: Impaired glucose control: actual or potential Potential for  alteratiion in Nutrition/Potential for imbalanced nutrition Goals: Patient/caregiver agrees to and verbalizes understanding of need to use nutritional supplements and/or vitamins as prescribed Date Initiated: 12/24/2021 Target Resolution Date: 01/21/2022 Goal Status: Active Patient/caregiver will maintain therapeutic glucose control Date Initiated: 12/24/2021 Target Resolution Date: 01/21/2022 Goal Status: Active Interventions: Assess HgA1c results as ordered upon admission and as needed Assess patient nutrition upon admission and as needed per policy Provide education on elevated blood sugars and impact on wound healing Provide education on nutrition Notes: Pressure Nursing Diagnoses: Knowledge deficit related to management of pressures ulcers Goals: Patient/caregiver will verbalize risk factors for pressure ulcer development Date Initiated: 12/24/2021 Target Resolution Date: 01/21/2022 Goal Status: Active Patient/caregiver will verbalize understanding of pressure ulcer management Date Initiated: 12/24/2021 Target Resolution Date: 01/21/2022 Goal Status: Active Interventions: Assess: immobility, friction, shearing, incontinence upon admission and as needed Assess offloading mechanisms upon admission and as needed Assess potential for pressure ulcer upon admission and as needed Provide education on pressure ulcers Notes: Wound/Skin Impairment Nursing Diagnoses: Impaired tissue integrity Knowledge deficit related to ulceration/compromised skin integrity Goals: Patient/caregiver will verbalize understanding of skin care regimen Date Initiated: 12/24/2021 Target Resolution Date: 01/21/2022 Goal Status: Active Interventions: Assess patient/caregiver ability to obtain necessary supplies Assess patient/caregiver ability to perform ulcer/skin care regimen upon admission and as needed Assess ulceration(s) every visit Provide education on ulcer and skin care Notes: Electronic  Signature(s) Signed: 12/28/2021 5:37:15 PM By: Levan Hurst RN, BSN Entered By: Levan Hurst on 12/24/2021 10:16:22 -------------------------------------------------------------------------------- Pain Assessment Details Patient Name: Date of Service: Shannon Schmidt. 12/24/2021 9:15 A M Medical Record Number: [id number] Patient Account Number: [id number] Date of Birth/Sex: Treating RN: [birth year] (81 y.o. Nancy Fetter Primary Care Zareya Tuckett: PA Haig Prophet, NO Other Clinician: Referring Yakira Duquette: Treating Marqueta Pulley/Extender: Yaakov Guthrie in Treatment: 0 Active Problems Location of Pain Severity and Description of Pain Patient Has Paino No Site Locations Pain Management and Medication Current Pain Management: Electronic Signature(s) Signed: 12/28/2021 5:37:15 PM By: Levan Hurst RN, BSN Entered By: Levan Hurst on 12/24/2021 09:46:00 -------------------------------------------------------------------------------- Patient/Caregiver Education Details Patient Name: Date of Service: Shannon Schmidt 1/27/2023andnbsp9:15 [place] Record Number: [id number] Patient Account Number: [id number] Date of Birth/Gender: Treating RN: 1941/02/26 (81 y.o. Nancy Fetter Primary Care Physician: PA Haig Prophet, NO Other Clinician: Referring Physician: Treating Physician/Extender: Yaakov Guthrie in Treatment: 0 Education Assessment Education Provided To: Patient Education Topics Provided Elevated Blood Sugar/ Impact on Healing: Methods: Explain/Verbal Responses: State content correctly Nutrition: Methods: Explain/Verbal Responses: State content correctly Pressure: Methods: Explain/Verbal Responses: State content correctly Wound/Skin Impairment: Methods: Explain/Verbal Responses: State content correctly Electronic Signature(s) Signed: 12/28/2021 5:37:15 PM By: Levan Hurst RN, BSN Entered By: Levan Hurst on 12/24/2021  10:16:49 -------------------------------------------------------------------------------- Wound Assessment Details Patient Name: Date of Service: Shannon Schmidt. 12/24/2021 9:15 A M Medical Record Number: [id number] Patient Account Number: [id number] Date of Birth/Sex: Treating RN: [birth year] (81 y.o. Nancy Fetter Primary Care Larry Alcock: PA Haig Prophet, NO Other Clinician: Referring Carlie Corpus: Treating Mansa Willers/Extender: Yaakov Guthrie in Treatment: 0 Wound Status Wound Number: 10 Primary Etiology: Pressure Ulcer  Wound Location: Left Ischium Wound Status: Open Wounding Event: Pressure Injury Date Acquired: 04/28/2021 Weeks Of Treatment: 0 Clustered Wound: No Photos Wound Measurements Length: (cm) 3.6 Width: (cm) 4.2 Depth: (cm) 0.1 Area: (cm) 11.875 Volume: (cm) 1.188 % Reduction in Area: 0% % Reduction in Volume: 0% Epithelialization: None Tunneling: No Undermining: No Wound Description Classification: Unstageable/Unclassified Wound Margin: Flat and Intact Exudate Amount: Medium Exudate Type: Serous Exudate Color: amber Foul Odor After Cleansing: No Slough/Fibrino Yes Wound Bed Granulation Amount: None Present (0%) Exposed Structure Necrotic Amount: Large (67-100%) Fascia Exposed: No Necrotic Quality: Adherent Slough Fat Layer (Subcutaneous Tissue) Exposed: No Tendon Exposed: No Muscle Exposed: No Joint Exposed: No Bone Exposed: No Treatment Notes Wound #10 (Ischium) Wound Laterality: Left Cleanser Wound Cleanser Discharge Instruction: Cleanse the wound with wound cleanser prior to applying a clean dressing using gauze sponges, not tissue or cotton balls. Peri-Wound Care Skin Prep Discharge Instruction: Use skin prep as directed Topical Primary Dressing Santyl Ointment Discharge Instruction: Apply nickel thick amount to wound bed as instructed Secondary Dressing MPM Excel SAP Bordered Dressing, 7x6.7 (Sacral) (in/in) Discharge Instruction:  Apply silicone border over primary dressing as directed. Secured With Compression Wrap Compression Stockings Environmental education officer) Signed: 12/28/2021 5:37:15 PM By: Levan Hurst RN, BSN Signed: 12/29/2021 9:58:51 AM By: Sandre Kitty Entered By: Sandre Kitty on 12/24/2021 09:42:32 -------------------------------------------------------------------------------- Wound Assessment Details Patient Name: Date of Service: Shannon Schmidt. 12/24/2021 9:15 A M Medical Record Number: [id number] Patient Account Number: [id number] Date of Birth/Sex: Treating RN: 08-01-1941 (81 y.o. Nancy Fetter Primary Care Phinley Schall: PA TIENT, NO Other Clinician: Referring Clayton Bosserman: Treating Brok Stocking/Extender: Yaakov Guthrie in Treatment: 0 Wound Status Wound Number: 11 Primary Etiology: Pressure Ulcer Wound Location: Sacrum Wound Status: Open Wounding Event: Pressure Injury Date Acquired: 04/28/2021 Weeks Of Treatment: 0 Clustered Wound: No Photos Wound Measurements Length: (cm) 6 Width: (cm) 8 Depth: (cm) 2.2 Area: (cm) 37.699 Volume: (cm) 82.938 % Reduction in Area: 0% % Reduction in Volume: 0% Epithelialization: None Tunneling: No Undermining: Yes Starting Position (o'clock): 10 Ending Position (o'clock): 12 Maximum Distance: (cm) 1.4 Wound Description Classification: Unstageable/Unclassified Wound Margin: Well defined, not attached Exudate Amount: Large Exudate Type: Serous Exudate Color: amber Foul Odor After Cleansing: No Slough/Fibrino Yes Wound Bed Granulation Amount: Small (1-33%) Exposed Structure Granulation Quality: Pink, Pale Fascia Exposed: No Necrotic Amount: Large (67-100%) Fat Layer (Subcutaneous Tissue) Exposed: Yes Necrotic Quality: Adherent Slough Tendon Exposed: No Muscle Exposed: No Joint Exposed: No Bone Exposed: No Treatment Notes Wound #11 (Sacrum) Cleanser Wound Cleanser Discharge Instruction: Cleanse the wound with  wound cleanser prior to applying a clean dressing using gauze sponges, not tissue or cotton balls. Peri-Wound Care Skin Prep Discharge Instruction: Use skin prep as directed Topical Primary Dressing Santyl Ointment Discharge Instruction: Apply nickel thick amount to wound bed as instructed Dakin's Solution 0.125%, 16 (oz) Discharge Instruction: Apply moistened gauze over Santyl Secondary Dressing MPM Excel SAP Bordered Dressing, 7x6.7 (Sacral) (in/in) Discharge Instruction: Apply silicone border over primary dressing as directed. Secured With Compression Wrap Compression Stockings Environmental education officer) Signed: 12/28/2021 5:37:15 PM By: Levan Hurst RN, BSN Signed: 12/29/2021 9:58:51 AM By: Sandre Kitty Entered By: Sandre Kitty on 12/24/2021 09:43:07 -------------------------------------------------------------------------------- Wound Assessment Details Patient Name: Date of Service: Shannon Schmidt. 12/24/2021 9:15 A M Medical Record Number: [id number] Patient Account Number: [id number] Date of Birth/Sex: Treating RN: October 20, 1941 (81 y.o. Nancy Fetter Primary Care Shanta Dorvil: PA Darnelle Spangle Other Clinician: Referring Irving Bloor: Treating Valon Glasscock/Extender: Heber Colstrip,  Jessica Weeks in Treatment: 0 Wound Status Wound Number: 12 Primary Etiology: Pressure Ulcer Wound Location: Lateral Sacrum Wound Status: Open Wounding Event: Pressure Injury Date Acquired: 04/28/2021 Weeks Of Treatment: 0 Clustered Wound: No Photos Wound Measurements Length: (cm) 1.7 Width: (cm) 3 Depth: (cm) 0.1 Area: (cm) 4.006 Volume: (cm) 0.401 % Reduction in Area: 0% % Reduction in Volume: 0% Epithelialization: None Tunneling: No Undermining: No Wound Description Classification: Unstageable/Unclassified Wound Margin: Flat and Intact Exudate Amount: Medium Exudate Type: Serous Exudate Color: amber Foul Odor After Cleansing: No Slough/Fibrino Yes Wound Bed Granulation  Amount: None Present (0%) Exposed Structure Necrotic Amount: Large (67-100%) Fascia Exposed: No Necrotic Quality: Eschar, Adherent Slough Fat Layer (Subcutaneous Tissue) Exposed: No Tendon Exposed: No Muscle Exposed: No Joint Exposed: No Bone Exposed: No Treatment Notes Wound #12 (Sacrum) Wound Laterality: Lateral Cleanser Wound Cleanser Discharge Instruction: Cleanse the wound with wound cleanser prior to applying a clean dressing using gauze sponges, not tissue or cotton balls. Peri-Wound Care Skin Prep Discharge Instruction: Use skin prep as directed Topical Primary Dressing Santyl Ointment Discharge Instruction: Apply nickel thick amount to wound bed as instructed Secondary Dressing MPM Excel SAP Bordered Dressing, 7x6.7 (Sacral) (in/in) Discharge Instruction: Apply silicone border over primary dressing as directed. Secured With Compression Wrap Compression Stockings Environmental education officer) Signed: 12/28/2021 5:37:15 PM By: Levan Hurst RN, BSN Signed: 12/29/2021 9:58:51 AM By: Sandre Kitty Entered By: Sandre Kitty on 12/24/2021 09:43:47 -------------------------------------------------------------------------------- Wound Assessment Details Patient Name: Date of Service: Shannon Schmidt. 12/24/2021 9:15 A M Medical Record Number: [id number] Patient Account Number: [id number] Date of Birth/Sex: Treating RN: 11-11-1941 (81 y.o. Nancy Fetter Primary Care Tekeya Geffert: PA TIENT, NO Other Clinician: Referring Vannie Hochstetler: Treating Yuette Putnam/Extender: Yaakov Guthrie in Treatment: 0 Wound Status Wound Number: 13 Primary Etiology: Pressure Ulcer Wound Location: Right Ischium Wound Status: Open Wounding Event: Pressure Injury Date Acquired: 04/28/2021 Weeks Of Treatment: 0 Clustered Wound: No Photos Wound Measurements Length: (cm) 0.6 Width: (cm) 0.3 Depth: (cm) 0.2 Area: (cm) 0.141 Volume: (cm) 0.028 % Reduction in Area: 0% % Reduction  in Volume: 0% Epithelialization: None Tunneling: No Undermining: No Wound Description Classification: Category/Stage III Wound Margin: Distinct, outline attached Exudate Amount: Small Exudate Type: Serosanguineous Exudate Color: red, brown Foul Odor After Cleansing: No Slough/Fibrino Yes Wound Bed Granulation Amount: Large (67-100%) Exposed Structure Granulation Quality: Pink, Pale Fascia Exposed: No Necrotic Amount: Small (1-33%) Fat Layer (Subcutaneous Tissue) Exposed: Yes Necrotic Quality: Adherent Slough Tendon Exposed: No Muscle Exposed: No Joint Exposed: No Bone Exposed: No Treatment Notes Wound #13 (Ischium) Wound Laterality: Right Cleanser Wound Cleanser Discharge Instruction: Cleanse the wound with wound cleanser prior to applying a clean dressing using gauze sponges, not tissue or cotton balls. Peri-Wound Care Skin Prep Discharge Instruction: Use skin prep as directed Topical Primary Dressing Santyl Ointment Discharge Instruction: Apply nickel thick amount to wound bed as instructed Secondary Dressing MPM Excel SAP Bordered Dressing, 7x6.7 (Sacral) (in/in) Discharge Instruction: Apply silicone border over primary dressing as directed. Secured With Compression Wrap Compression Stockings Environmental education officer) Signed: 12/28/2021 5:37:15 PM By: Levan Hurst RN, BSN Signed: 12/29/2021 9:58:51 AM By: Sandre Kitty Entered By: Sandre Kitty on 12/24/2021 09:44:26 -------------------------------------------------------------------------------- Vitals Details Patient Name: Date of Service: Shannon Schmidt. 12/24/2021 9:15 A M Medical Record Number: [id number] Patient Account Number: [id number] Date of Birth/Sex: Treating RN: 10-09-1941 (81 y.o. Nancy Fetter Primary Care Kaytlyn Din: PA Darnelle Spangle Other Clinician: Referring Raveena Hebdon: Treating Braley Luckenbaugh/Extender: Yaakov Guthrie in Treatment: 0  Vital Signs Time Taken:  09:23 Temperature (F): 97.7 Pulse (bpm): 83 Respiratory Rate (breaths/min): 16 Blood Pressure (mmHg): 97/63 Reference Range: 80 - 120 mg / dl Electronic Signature(s) Signed: 12/28/2021 5:37:15 PM By: Levan Hurst RN, BSN Entered By: Levan Hurst on 12/24/2021 09:25:57

## 2022-01-05 ENCOUNTER — Ambulatory Visit (HOSPITAL_COMMUNITY): Admission: RE | Admit: 2022-01-05 | Payer: Commercial Managed Care - HMO | Source: Ambulatory Visit

## 2022-01-05 ENCOUNTER — Encounter (HOSPITAL_COMMUNITY): Payer: Self-pay

## 2022-01-07 ENCOUNTER — Encounter (HOSPITAL_BASED_OUTPATIENT_CLINIC_OR_DEPARTMENT_OTHER): Payer: Medicare Other | Admitting: Internal Medicine

## 2024-06-28 DEATH — deceased
